# Patient Record
Sex: Male | Born: 1945 | Race: White | Hispanic: No | Marital: Married | Smoking: Former smoker
Health system: Southern US, Community
[De-identification: ages and names within clinical notes are randomized; demographics above are authoritative.]

## PROBLEM LIST (undated history)

## (undated) DIAGNOSIS — L570 Actinic keratosis: Secondary | ICD-10-CM

## (undated) DIAGNOSIS — I519 Heart disease, unspecified: Secondary | ICD-10-CM

## (undated) DIAGNOSIS — E785 Hyperlipidemia, unspecified: Secondary | ICD-10-CM

## (undated) DIAGNOSIS — G4700 Insomnia, unspecified: Secondary | ICD-10-CM

## (undated) DIAGNOSIS — M109 Gout, unspecified: Secondary | ICD-10-CM

## (undated) DIAGNOSIS — Z860100 Personal history of colon polyps, unspecified: Secondary | ICD-10-CM

## (undated) DIAGNOSIS — I2510 Atherosclerotic heart disease of native coronary artery without angina pectoris: Secondary | ICD-10-CM

## (undated) DIAGNOSIS — M1990 Unspecified osteoarthritis, unspecified site: Secondary | ICD-10-CM

## (undated) DIAGNOSIS — N289 Disorder of kidney and ureter, unspecified: Secondary | ICD-10-CM

## (undated) DIAGNOSIS — N529 Male erectile dysfunction, unspecified: Secondary | ICD-10-CM

## (undated) DIAGNOSIS — E119 Type 2 diabetes mellitus without complications: Secondary | ICD-10-CM

## (undated) DIAGNOSIS — I10 Essential (primary) hypertension: Secondary | ICD-10-CM

## (undated) HISTORY — DX: Atherosclerotic heart disease of native coronary artery without angina pectoris: I25.10

## (undated) HISTORY — DX: Hyperlipidemia, unspecified: E78.5

## (undated) HISTORY — DX: Gout, unspecified: M10.9

## (undated) HISTORY — DX: Insomnia, unspecified: G47.00

## (undated) HISTORY — DX: Unspecified osteoarthritis, unspecified site: M19.90

## (undated) HISTORY — DX: Disorder of kidney and ureter, unspecified: N28.9

## (undated) HISTORY — DX: Essential (primary) hypertension: I10

## (undated) HISTORY — DX: Heart disease, unspecified: I51.9

## (undated) HISTORY — DX: Personal history of colonic polyps: Z86.010

## (undated) HISTORY — DX: Actinic keratosis: L57.0

## (undated) HISTORY — DX: Personal history of colon polyps, unspecified: Z86.0100

## (undated) HISTORY — DX: Male erectile dysfunction, unspecified: N52.9

---

## 1980-08-20 DIAGNOSIS — I10 Essential (primary) hypertension: Secondary | ICD-10-CM

## 1980-08-20 HISTORY — DX: Essential (primary) hypertension: I10

## 1986-08-20 HISTORY — PX: KNEE SURGERY: SHX244

## 1995-08-21 DIAGNOSIS — M1990 Unspecified osteoarthritis, unspecified site: Secondary | ICD-10-CM

## 1995-08-21 HISTORY — DX: Unspecified osteoarthritis, unspecified site: M19.90

## 2004-08-20 HISTORY — PX: CARDIAC CATHETERIZATION: SHX172

## 2005-03-20 ENCOUNTER — Encounter: Admission: RE | Admit: 2005-03-20 | Discharge: 2005-03-20 | Payer: Self-pay | Admitting: Family Medicine

## 2005-08-20 HISTORY — PX: COLONOSCOPY: SHX174

## 2006-07-25 ENCOUNTER — Ambulatory Visit: Payer: Self-pay | Admitting: General Surgery

## 2006-08-16 ENCOUNTER — Other Ambulatory Visit: Payer: Self-pay

## 2006-08-23 ENCOUNTER — Ambulatory Visit: Payer: Self-pay | Admitting: General Surgery

## 2006-08-23 HISTORY — PX: HERNIA REPAIR: SHX51

## 2008-01-17 ENCOUNTER — Emergency Department (HOSPITAL_COMMUNITY): Admission: EM | Admit: 2008-01-17 | Discharge: 2008-01-17 | Payer: Self-pay | Admitting: Emergency Medicine

## 2009-11-16 ENCOUNTER — Ambulatory Visit: Payer: Self-pay | Admitting: Rheumatology

## 2009-11-17 ENCOUNTER — Ambulatory Visit: Payer: Self-pay | Admitting: Rheumatology

## 2009-11-18 ENCOUNTER — Ambulatory Visit: Payer: Self-pay | Admitting: Rheumatology

## 2011-01-01 ENCOUNTER — Other Ambulatory Visit: Payer: Self-pay | Admitting: Rheumatology

## 2011-10-19 ENCOUNTER — Ambulatory Visit: Payer: Self-pay | Admitting: General Surgery

## 2013-03-05 ENCOUNTER — Encounter: Payer: Self-pay | Admitting: *Deleted

## 2014-04-27 ENCOUNTER — Ambulatory Visit: Payer: Self-pay | Admitting: Internal Medicine

## 2015-04-26 ENCOUNTER — Other Ambulatory Visit: Payer: Self-pay | Admitting: Internal Medicine

## 2015-04-26 DIAGNOSIS — I77811 Abdominal aortic ectasia: Secondary | ICD-10-CM

## 2015-04-26 DIAGNOSIS — I159 Secondary hypertension, unspecified: Secondary | ICD-10-CM

## 2015-05-05 ENCOUNTER — Ambulatory Visit: Payer: Self-pay

## 2015-05-17 ENCOUNTER — Ambulatory Visit
Admission: RE | Admit: 2015-05-17 | Discharge: 2015-05-17 | Disposition: A | Discharge status: Home / Self Care | Payer: Medicare Other | Source: Ambulatory Visit | Attending: Internal Medicine | Admitting: Internal Medicine

## 2015-05-17 DIAGNOSIS — I159 Secondary hypertension, unspecified: Secondary | ICD-10-CM

## 2015-05-17 DIAGNOSIS — I77811 Abdominal aortic ectasia: Secondary | ICD-10-CM | POA: Insufficient documentation

## 2015-05-17 DIAGNOSIS — I10 Essential (primary) hypertension: Secondary | ICD-10-CM | POA: Diagnosis present

## 2015-10-26 ENCOUNTER — Other Ambulatory Visit: Payer: Self-pay | Admitting: Cardiology

## 2015-10-26 DIAGNOSIS — R3129 Other microscopic hematuria: Secondary | ICD-10-CM

## 2015-11-01 ENCOUNTER — Ambulatory Visit
Admission: RE | Admit: 2015-11-01 | Discharge: 2015-11-01 | Disposition: A | Discharge status: Home / Self Care | Payer: Medicare Other | Source: Ambulatory Visit | Attending: Cardiology | Admitting: Cardiology

## 2015-11-01 DIAGNOSIS — R3129 Other microscopic hematuria: Secondary | ICD-10-CM

## 2016-02-02 ENCOUNTER — Encounter: Payer: Self-pay | Admitting: Internal Medicine

## 2016-02-02 ENCOUNTER — Encounter (INDEPENDENT_AMBULATORY_CARE_PROVIDER_SITE_OTHER): Payer: Medicare Other

## 2016-02-02 ENCOUNTER — Ambulatory Visit (INDEPENDENT_AMBULATORY_CARE_PROVIDER_SITE_OTHER): Payer: Medicare Other | Admitting: Internal Medicine

## 2016-02-02 VITALS — BP 144/90 | HR 75 | Ht 72.0 in | Wt 199.5 lb

## 2016-02-02 DIAGNOSIS — I493 Ventricular premature depolarization: Secondary | ICD-10-CM

## 2016-02-02 DIAGNOSIS — I359 Nonrheumatic aortic valve disorder, unspecified: Secondary | ICD-10-CM

## 2016-02-02 DIAGNOSIS — R9431 Abnormal electrocardiogram [ECG] [EKG]: Secondary | ICD-10-CM | POA: Diagnosis not present

## 2016-02-02 NOTE — Patient Instructions (Signed)
Medication Instructions: - Your physician recommends that you continue on your current medications as directed. Please refer to the Current Medication list given to you today.  Labwork: - none  Procedures/Testing: - Your physician has requested that you have an echocardiogram. Echocardiography is a painless test that uses sound waves to create images of your heart. It provides your doctor with information about the size and shape of your heart and how well your heart's chambers and valves are working. This procedure takes approximately one hour. There are no restrictions for this procedure.  - Your physician has recommended that you wear a 48 hour holter monitor. Holter monitors are medical devices that record the heart's electrical activity. Doctors most often use these monitors to diagnose arrhythmias. Arrhythmias are problems with the speed or rhythm of the heartbeat. The monitor is a small, portable device. You can wear one while you do your normal daily activities. This is usually used to diagnose what is causing palpitations/syncope (passing out).  Follow-Up: - pending results of your tests  Any Additional Special Instructions Will Be Listed Below (If Applicable).     If you need a refill on your cardiac medications before your next appointment, please call your pharmacy.

## 2016-02-02 NOTE — Progress Notes (Signed)
ELECTROPHYSIOLOGY CONSULT NOTE  Patient ID: Paul Choi, MRN: [id number], DOB/AGE: 22-May-1946 70 y.o. Admit date: (Not on file) Date of Consult: 02/02/2016  Primary Physician: Adin Hector, MD Primary Cardiologist: new Consulting Physician none  Chief Complaint: palpiations   HPI Paul Choi is a 70 y.o. male  Seen at his own request because of Irregular heartbeats. He had gone to [facility] urgent care because of chigger bites; they noted an irregular heartbeat. An ECG as described in as sinus rhythm with ectopy He has no history of palpitations and no previous diagnosis of arrhythmia.  He was previously seen by Serafina Royals. In the context of [place] it was his desire, as a former employee of [employer] to have tertiary care work that might be required done at [employer] [facility]. It is for that reason that he comes to [facility] today  He has a history of coronary artery disease described in the old chart from the catheterization 2003 demonstrating an occluded circumflex. He underwent echocardiogram in 2013 which reported  a bicuspid aortic valve  moderate MR and an EF of 40%.  He has some peripheral edema which has been attributed to amlodipine. He has mild dyspnea on exertion but walks a mile many days a week and about 20 minutes. He denies nocturnal dyspnea orthopnea. He's had no palpitations or syncope. He's had no chest pain.  He has sleep disordered breathing and daytime somnolence.  He has modest renal insufficiency with a GFR of 46.  He has diabetes with persistent hemoglobin A1c's of greater than 6.5  Past Medical History  Diagnosis Date  . Arthritis 1997  . Hypertension 1982  . Arteriosclerotic cardiovascular disease   . Personal history of colonic polyps   . Heart disease   . Hyperlipidemia   . Coronary artery disease   . Renal insufficiency   . Gout   . ED (erectile dysfunction)   . Insomnia       [facility]  Past Surgical History  Procedure Laterality  Date  . Hernia repair    . Colonoscopy  2007  . Knee surgery  1988  . Cardiac catheterization  2006     Home Meds: Prior to Admission medications   Medication Sig Start Date End Date Taking? Authorizing Provider  allopurinol (ZYLOPRIM) 100 MG tablet Take 200 mg by mouth daily.    Yes Historical Provider, MD  amLODipine (NORVASC) 5 MG tablet Take 5 mg by mouth daily.   Yes Historical Provider, MD  aspirin 81 MG tablet Take 81 mg by mouth daily.   Yes Historical Provider, MD  cholecalciferol (VITAMIN D) 1000 units tablet Take 1,000 Units by mouth daily.   Yes Historical Provider, MD  colchicine 0.6 MG tablet Take 0.6 mg by mouth daily.   Yes Historical Provider, MD  Fish Oil-Cholecalciferol (FISH OIL + D3 PO) Take by mouth.   Yes Historical Provider, MD  losartan (COZAAR) 100 MG tablet Take 100 mg by mouth daily.   Yes Historical Provider, MD  lovastatin (MEVACOR) 40 MG tablet Take 40 mg by mouth at bedtime.   Yes Historical Provider, MD    Allergies:  Allergies  Allergen Reactions  . Ace Inhibitors Cough  . Lovastatin Other (See Comments)    Myalgia at 80-mg dose  . Other Nausea Only and Hives  . Shellfish Allergy Hives  . Trazodone Other (See Comments)    "felt like zombie"    Social History   Social History  .  Marital Status: Married    Spouse Name: N/A  . Number of Children: N/A  . Years of Education: N/A   Occupational History  . Not on file.   Social History Main Topics  . Smoking status: Former Smoker -- 1.00 packs/day for 25 years    Quit date: 09/22/1995  . Smokeless tobacco: Never Used  . Alcohol Use: Yes  . Drug Use: No  . Sexual Activity: Not on file   Other Topics Concern  . Not on file   Social History Narrative     Family History  Problem Relation Age of Onset  . Hypertension Mother   . Hypertension Brother      ROS:  Please see the history of present illness.     All other systems reviewed and negative.    Physical Exam: Blood pressure  144/90, pulse 75, height 6' (1.829 m), weight 199 lb 8 oz (90.493 kg). General: Well developed, well nourished male in no acute distress. Head: Normocephalic, atraumatic, sclera non-icteric, no xanthomas, nares are without discharge. EENT: normal  Lymph Nodes:  none Neck: Negative for carotid bruits. JVD not elevated. Carotid upstrokes were normal Back:without scoliosis kyphosis Lungs: Clear bilaterally to auscultation without wheezes, rales, or rhonchi. Breathing is unlabored. Heart: Regularly irregular with S1 S2.  2/6 systolic  murmur . No rubs, or gallops appreciated. Abdomen: Soft, non-tender, non-distended with normoactive bowel sounds. No hepatomegaly. No rebound/guarding. No obvious abdominal masses. Msk:  Strength and tone appear normal for age. Extremities: No clubbing or cyanosis. No  edema.  Distal pedal pulses are 2+ and equal bilaterally. Skin: Warm and Dry Neuro: Alert and oriented X 3. CN III-XII intact Grossly normal sensory and motor function . Psych:  Responds to questions appropriately with a normal affect.      Labs: Cardiac Enzymes No results for input(s): CKTOTAL, CKMB, TROPONINI in the last 72 hours. CBC No results found for: WBC, HGB, HCT, MCV, PLT PROTIME: No results for input(s): LABPROT, INR in the last 72 hours. Chemistry No results for input(s): NA, K, CL, CO2, BUN, CREATININE, CALCIUM, PROT, BILITOT, ALKPHOS, ALT, AST, GLUCOSE in the last 168 hours.  Invalid input(s): LABALBU Lipids No results found for: CHOL, HDL, LDLCALC, TRIG BNP No results found for: PROBNP Thyroid Function Tests: No results for input(s): TSH, T4TOTAL, T3FREE, THYROIDAB in the last 72 hours.  Invalid input(s): FREET3 Miscellaneous No results found for: DDIMER  Radiology/Studies:  No results found.  EKG:  Sinus rhythm at 75 Intervals 17/08/38 Axis XXXVI  Assessment and Plan:  Palpitations-PVCs by examination  Coronary artery disease with an occluded circumflex,  left-right collaterals 2003  Cardiomyopathy EF 40% echo 2013 >>>>>>Mitral regurgitation-moderate >>>>>> bicuspid aortic valve  Diabetes  Sleep disordered breathing  Hypertension   The patient has palpitations which are PVCs by examination. He has no known atrial fibrillation. The PVCs raises the question as to whether they could be contributing to what was described as a cardiomyopathy in 2013. We will reassess left ventricular function. We will quantitate his PVC burden. It may be appropriate to try PVC suppression depending on this.  LV function will inform medication options; he is currently on an ARB. In the event that his LV function is suppressed, we will change his amlodipine--carvedilol  We also need to reassess was described as a bicuspid aortic valve. His physical examination is not striking in this regard nor in auscultatory evidence of mitral regurgitation   His blood pressure is reasonably controlled for his diabetes.  He has sleep disordered breathing I think would benefit from an outpatient sleep study  I will have him follow-up with Dr. Caryl Comes regarding treatment for his diabetes. I've encouraged him and weight loss    Virl Axe

## 2016-02-04 DIAGNOSIS — I493 Ventricular premature depolarization: Secondary | ICD-10-CM | POA: Diagnosis not present

## 2016-02-09 ENCOUNTER — Ambulatory Visit
Admission: RE | Admit: 2016-02-09 | Discharge: 2016-02-09 | Disposition: A | Discharge status: Home / Self Care | Payer: Medicare Other | Source: Ambulatory Visit | Attending: Internal Medicine | Admitting: Internal Medicine

## 2016-02-09 DIAGNOSIS — I493 Ventricular premature depolarization: Secondary | ICD-10-CM | POA: Diagnosis not present

## 2016-02-10 ENCOUNTER — Other Ambulatory Visit: Payer: Self-pay

## 2016-02-10 ENCOUNTER — Ambulatory Visit (INDEPENDENT_AMBULATORY_CARE_PROVIDER_SITE_OTHER): Payer: Medicare Other

## 2016-02-10 DIAGNOSIS — I359 Nonrheumatic aortic valve disorder, unspecified: Secondary | ICD-10-CM

## 2016-02-10 LAB — ECHOCARDIOGRAM COMPLETE
AO mean calculated velocity dopler: 113 cm/s
AOASC: 41 cm
AOPV: 0.65 m/s
AOVTI: 33.2 cm
AV Area VTI index: 1.64 cm2/m2
AV Mean grad: 6 mmHg
AV pk vel: 173 cm/s
AV vel: 3.54
AVAREAMEANV: 2.58 cm2
AVAREAMEANVIN: 1.2 cm2/m2
AVAREAVTI: 2.48 cm2
AVCELMEANRAT: 0.68
AVPG: 12 mmHg
CHL CUP AV PEAK INDEX: 1.15
CHL CUP AV VALUE AREA INDEX: 1.64
CHL CUP MV DEC (S): 285
E decel time: 285 msec
E/e' ratio: 7.58
FS: 23 % — AB (ref 28–44)
IV/PV OW: 1.01
LA diam end sys: 28 mm
LA vol: 37.9 mL
LADIAMINDEX: 1.3 cm/m2
LASIZE: 28 mm
LAVOLA4C: 26 mL
LAVOLIN: 17.6 mL/m2
LDCA: 3.8 cm2
LV E/e' medial: 7.58
LV E/e'average: 7.58
LV PW d: 7.77 mm — AB (ref 0.6–1.1)
LV TDI E'MEDIAL: 5.11
LVELAT: 8.38 cm/s
LVOT SV: 117 mL
LVOT VTI: 30.9 cm
LVOT peak grad rest: 5 mmHg
LVOTD: 22 mm
LVOTPV: 113 cm/s
LVOTVTI: 0.93 cm
MVPKAVEL: 113 m/s
MVPKEVEL: 63.5 m/s
TAPSE: 22.8 mm
TDI e' lateral: 8.38
Valve area: 3.54 cm2

## 2016-02-17 ENCOUNTER — Telehealth: Payer: Self-pay | Admitting: Internal Medicine

## 2016-02-17 ENCOUNTER — Ambulatory Visit: Payer: Medicare Other | Admitting: Internal Medicine

## 2016-02-17 DIAGNOSIS — Q231 Congenital insufficiency of aortic valve: Secondary | ICD-10-CM

## 2016-02-17 DIAGNOSIS — I77810 Thoracic aortic ectasia: Secondary | ICD-10-CM

## 2016-02-17 DIAGNOSIS — R931 Abnormal findings on diagnostic imaging of heart and coronary circulation: Secondary | ICD-10-CM

## 2016-02-17 NOTE — Telephone Encounter (Signed)
Pt calling stating he had echo and monitor done a few weeks ago Saw in [place] that results were in there But he does not understand them Would like a call back on that For we stated to him we would call him back and schedule a follow up based on those results. Please call back.

## 2016-02-17 NOTE — Telephone Encounter (Signed)
I called and spoke with the patient.  He is aware of his echo/ holter results.  Echo: Notes Recorded by Deboraha Sprang, MD on 02/13/2016 at 4:55 PM Please Inform Patient Echo showed Normal heart muscle function; aortic valve may be biscupid- functioanlly if not structurally, no evidence of stenosi. The aortic root is enlarged, albeit mildy which may be a consequence of the HBP or the valve. Recommendation would be for cardiac CT --angiogram for aortic size and to attempt to visualize the valve  Holter: Notes Recorded by Deboraha Sprang, MD on 02/13/2016 at 3:04 PM PVC burden not trivail but not clearly contributing 6.3% Will await echo        He is agreeable with proceeding with a cardiac CTA, but would like this done are [facility]. I advised the patient that I do not think [facility] has the capability to do this particular scan here. I advised him I will call to clarify- I have left a message for the radiology dept to please call me back to confirm. Will also confirm follow up with Dr. Caryl Comes.   Call back from [facility] radiology- Cardiac CTA is not done here- will need to go to [facility].

## 2016-02-22 ENCOUNTER — Encounter: Payer: Self-pay | Admitting: Internal Medicine

## 2016-02-22 NOTE — Telephone Encounter (Signed)
I called and spoke with the patient. I advised him Cardiac CT is not able to be done at [facility]. He is agreeable with coming to [facility] to have this done. I advised him I will place the order and scheduling will call him to arrange this.

## 2016-02-27 ENCOUNTER — Telehealth: Payer: Self-pay | Admitting: Internal Medicine

## 2016-02-27 NOTE — Telephone Encounter (Signed)
New message      Pt is having a CT on 02-29-16.  They need the order signed in epic please

## 2016-02-27 NOTE — Telephone Encounter (Signed)
Routing message to Dr. Caryl Comes to sign order

## 2016-02-27 NOTE — Telephone Encounter (Signed)
Done

## 2016-02-29 ENCOUNTER — Encounter (HOSPITAL_COMMUNITY): Payer: Self-pay

## 2016-02-29 ENCOUNTER — Ambulatory Visit (HOSPITAL_COMMUNITY)
Admission: RE | Admit: 2016-02-29 | Discharge: 2016-02-29 | Disposition: A | Discharge status: Home / Self Care | Payer: Medicare Other | Source: Ambulatory Visit | Attending: Internal Medicine | Admitting: Internal Medicine

## 2016-02-29 DIAGNOSIS — J439 Emphysema, unspecified: Secondary | ICD-10-CM | POA: Diagnosis not present

## 2016-02-29 DIAGNOSIS — R918 Other nonspecific abnormal finding of lung field: Secondary | ICD-10-CM | POA: Diagnosis not present

## 2016-02-29 DIAGNOSIS — Q231 Congenital insufficiency of aortic valve: Secondary | ICD-10-CM

## 2016-02-29 LAB — POCT I-STAT CREATININE: CREATININE: 1.5 mg/dL — AB (ref 0.61–1.24)

## 2016-02-29 MED ORDER — METOPROLOL TARTRATE 5 MG/5ML IV SOLN
INTRAVENOUS | Status: AC
  Administered 2016-02-29: 5 mg via INTRAVENOUS
  Filled 2016-02-29: qty 15

## 2016-02-29 MED ORDER — NITROGLYCERIN 0.4 MG SL SUBL
0.8000 mg | SUBLINGUAL_TABLET | SUBLINGUAL | Status: DC | PRN
  Administered 2016-02-29: 0.8 mg via SUBLINGUAL

## 2016-02-29 MED ORDER — NITROGLYCERIN 0.4 MG SL SUBL
SUBLINGUAL_TABLET | SUBLINGUAL | Status: AC
  Administered 2016-02-29: 0.8 mg via SUBLINGUAL
  Filled 2016-02-29: qty 2

## 2016-02-29 MED ORDER — DIPHENHYDRAMINE HCL 50 MG/ML IJ SOLN
INTRAMUSCULAR | Status: AC
  Administered 2016-02-29: 50 mg
  Filled 2016-02-29: qty 1

## 2016-02-29 MED ORDER — METOPROLOL TARTRATE 5 MG/5ML IV SOLN
5.0000 mg | INTRAVENOUS | Status: DC | PRN
  Administered 2016-02-29: 5 mg via INTRAVENOUS

## 2016-02-29 MED ORDER — IOPAMIDOL (ISOVUE-370) INJECTION 76%
INTRAVENOUS | Status: AC
  Administered 2016-02-29: 80 mL
  Filled 2016-02-29: qty 100

## 2016-02-29 NOTE — Progress Notes (Signed)
Pt assisted off CT table with coughing, continued to cough, and audible wheeze Dr. Carlis Abbott at [facility], [place] started and oxygen on

## 2016-02-29 NOTE — Progress Notes (Signed)
Dr Meda Coffee updated per phone, will continue to monitor, Dr Carlis Abbott remains at bedside

## 2016-02-29 NOTE — Progress Notes (Signed)
Pt sitting up Dr Carlis Abbott at bs states he feels better, VS stable on room air

## 2016-02-29 NOTE — Progress Notes (Signed)
Pt discharged home amb to waiting room with spouse, denies c/o

## 2016-03-08 ENCOUNTER — Encounter: Payer: Self-pay | Admitting: Internal Medicine

## 2016-03-08 NOTE — Progress Notes (Signed)
Spoke with the patient about his CT. 1-contrast allergic reaction #2-ends, will need MRI scanning a year from now for reassessment of his aorta 3-Will review with Dr. Burt Knack the potential value of functional assessment of the asymptomatic presumably high-grade lesion 4-aortic valve is bicuspid but without evidence of stenosis this will require long-term follow-up 5-small pulmonary nodules noted. These can be followed by noncontrast CT. 6-we'll forward these results to Dr. Ramonita Lab and ask that he follow-up on the noncontrast CT

## 2016-03-13 ENCOUNTER — Other Ambulatory Visit: Payer: Self-pay | Admitting: Internal Medicine

## 2016-03-13 NOTE — Telephone Encounter (Signed)
Paul Choi is calling because Dr. Caryl Comes was to have a new prescription for a Generic for Lipitor and when checking at his pharmacy ([place] [address] in [place] ) . Please call    Thanks

## 2016-03-15 NOTE — Telephone Encounter (Signed)
Called patient to follow up on his request and inform him that Dr. Caryl Comes and his nurse are both out this week.   Patient states that he and Dr. Caryl Comes spoke recently about switching him to generic Lipitor but when he checked with his pharmacy nothing had been sent in.  He was simply calling to follow up. States he figured Dr. Caryl Comes was out this week and there is no rush.  He is aware someone will contact him next week once discussed with Dr. Caryl Comes.

## 2016-03-19 MED ORDER — ATORVASTATIN CALCIUM 40 MG PO TABS: 40.0000 mg | ORAL_TABLET | Freq: Every day | ORAL | 6 refills | Status: DC

## 2016-03-19 NOTE — Telephone Encounter (Signed)
Per Dr. Caryl Comes- start atorvastatin 40 mg once daily. RX sent to the patient's pharmacy.

## 2016-03-29 ENCOUNTER — Encounter: Payer: Self-pay | Admitting: Internal Medicine

## 2016-04-03 ENCOUNTER — Telehealth: Payer: Self-pay | Admitting: Internal Medicine

## 2016-04-03 DIAGNOSIS — R9431 Abnormal electrocardiogram [ECG] [EKG]: Secondary | ICD-10-CM

## 2016-04-03 DIAGNOSIS — R931 Abnormal findings on diagnostic imaging of heart and coronary circulation: Secondary | ICD-10-CM

## 2016-04-03 NOTE — Telephone Encounter (Signed)
I spoke with Dr. Burt Knack. While he agreed that the indication for intervention would be symptoms, he also felt that if there were large portion of myocardium at risk he would be inclined towards a discussion regarding intervention. I will review this with the pt

## 2016-04-03 NOTE — Telephone Encounter (Signed)
Spoke with the patient--as outlined in my conversation with Dr. Burt Knack, there is minimal once is a we will undertake Myoview scanning to look functionally at the potential myocardium at risk

## 2016-04-03 NOTE — Telephone Encounter (Signed)
Dr. Caryl Comes- please clarify.

## 2016-04-05 ENCOUNTER — Encounter: Payer: Self-pay | Admitting: *Deleted

## 2016-04-05 ENCOUNTER — Other Ambulatory Visit: Payer: Self-pay | Admitting: *Deleted

## 2016-04-05 MED ORDER — ATORVASTATIN CALCIUM 40 MG PO TABS: 40.0000 mg | ORAL_TABLET | Freq: Every day | ORAL | 3 refills | Status: DC

## 2016-04-05 NOTE — Telephone Encounter (Signed)
[place]  Your caregiver has ordered a Stress Test with nuclear imaging. The purpose of this test is to evaluate the blood supply to your heart muscle. This procedure is referred to as a "Non-Invasive Stress Test." This is because other than having an IV started in your vein, nothing is inserted or "invades" your body. Cardiac stress tests are done to find areas of poor blood flow to the heart by determining the extent of coronary artery disease (CAD). Some patients exercise on a treadmill, which naturally increases the blood flow to your heart, while others who are  unable to walk on a treadmill due to physical limitations have a pharmacologic/chemical stress agent called Lexiscan . This medicine will mimic walking on a treadmill by temporarily increasing your coronary blood flow.   Please note: these test may take anywhere between 2-4 hours to complete  PLEASE REPORT TO [facility] MEDICAL MALL ENTRANCE  THE VOLUNTEERS AT THE FIRST DESK WILL DIRECT YOU WHERE TO GO  Date of Procedure:_______Wednesday 8/23/17______________________________  Arrival Time for Procedure:________8:15 am______________________  Instructions regarding medication:   ____ : Hold diabetes medication morning of procedure  ____:  Hold betablocker(s) night before procedure and morning of procedure  ____:  Hold other medications as follows:_________________________________________________________________________________________________________________________________________________________________________________________________________________________________________________________________________________________  PLEASE NOTIFY THE OFFICE AT LEAST 24 HOURS IN ADVANCE IF YOU ARE UNABLE TO KEEP YOUR APPOINTMENT.  [phone] AND  PLEASE NOTIFY NUCLEAR MEDICINE AT [facility] AT LEAST 24 HOURS IN ADVANCE IF YOU ARE UNABLE TO KEEP YOUR APPOINTMENT. [phone]  How to prepare for your Myoview test:  1. Do not eat or drink after  midnight 2. No caffeine for 24 hours prior to test 3. No smoking 24 hours prior to test. 4. Your medication may be taken with water.  If your doctor stopped a medication because of this test, do not take that medication. 5. Ladies, please do not wear dresses.  Skirts or pants are appropriate. Please wear a short sleeve shirt. 6. No perfume, cologne or lotion. 7. Wear comfortable walking shoes. No heels!           The patient is aware of the date/ time for his nuclear stress test. He is is aware of his instructions. I have advised him to call back with any further questions/ concerns/ if he needs to reschedule. He voices understanding.

## 2016-04-05 NOTE — Telephone Encounter (Signed)
Stress myoview for evidence of significant ischemia in the context of MRI identified lesion

## 2016-04-11 ENCOUNTER — Encounter
Admission: RE | Admit: 2016-04-11 | Discharge: 2016-04-11 | Disposition: A | Discharge status: Home / Self Care | Payer: Medicare Other | Source: Ambulatory Visit | Attending: Internal Medicine | Admitting: Internal Medicine

## 2016-04-11 DIAGNOSIS — R931 Abnormal findings on diagnostic imaging of heart and coronary circulation: Secondary | ICD-10-CM | POA: Diagnosis not present

## 2016-04-11 DIAGNOSIS — I259 Chronic ischemic heart disease, unspecified: Secondary | ICD-10-CM | POA: Insufficient documentation

## 2016-04-11 DIAGNOSIS — R9431 Abnormal electrocardiogram [ECG] [EKG]: Secondary | ICD-10-CM | POA: Diagnosis not present

## 2016-04-11 LAB — NM MYOCAR MULTI W/SPECT W/WALL MOTION / EF
CHL CUP MPHR: 130 {beats}/min
CHL CUP NUCLEAR SDS: 1
CHL CUP NUCLEAR SRS: 11
CHL CUP NUCLEAR SSS: 11
CSEPED: 4 min
CSEPPHR: 130 {beats}/min
Estimated workload: 6.5 METS
Exercise duration (sec): 42 s
LVDIAVOL: 104 mL (ref 62–150)
LVSYSVOL: 37 mL
Percent HR: 87 %
Rest HR: 65 {beats}/min
TID: 1

## 2016-04-11 MED ORDER — TECHNETIUM TC 99M TETROFOSMIN IV KIT
28.0300 | PACK | Freq: Once | INTRAVENOUS | Status: AC | PRN
  Administered 2016-04-11: 28.03 via INTRAVENOUS

## 2016-04-11 MED ORDER — TECHNETIUM TC 99M TETROFOSMIN IV KIT
12.8500 | PACK | Freq: Once | INTRAVENOUS | Status: AC | PRN
  Administered 2016-04-11: 12.85 via INTRAVENOUS

## 2016-04-17 ENCOUNTER — Telehealth: Payer: Self-pay | Admitting: Internal Medicine

## 2016-04-17 NOTE — Telephone Encounter (Signed)
I spoke with the patient and he is aware of his results. Will schedule on 04/27/16 at 11:30 am with Dr. Burt Knack per Ander Purpura.

## 2016-04-17 NOTE — Telephone Encounter (Signed)
New message ° ° ° ° ° °Returning a call to the nurse to get stress test results °

## 2016-04-24 ENCOUNTER — Encounter: Payer: Self-pay | Admitting: Cardiovascular Disease

## 2016-04-26 ENCOUNTER — Ambulatory Visit (INDEPENDENT_AMBULATORY_CARE_PROVIDER_SITE_OTHER): Payer: Medicare Other | Admitting: Cardiovascular Disease

## 2016-04-26 ENCOUNTER — Encounter: Payer: Self-pay | Admitting: Cardiovascular Disease

## 2016-04-26 VITALS — BP 148/82 | HR 71 | Ht 73.0 in | Wt 198.8 lb

## 2016-04-26 DIAGNOSIS — R931 Abnormal findings on diagnostic imaging of heart and coronary circulation: Secondary | ICD-10-CM | POA: Diagnosis not present

## 2016-04-26 DIAGNOSIS — Z01810 Encounter for preprocedural cardiovascular examination: Secondary | ICD-10-CM | POA: Diagnosis not present

## 2016-04-26 LAB — CBC WITH DIFFERENTIAL/PLATELET
BASOS ABS: 0 {cells}/uL (ref 0–200)
Basophils Relative: 0 %
EOS ABS: 264 {cells}/uL (ref 15–500)
Eosinophils Relative: 4 %
HCT: 40.7 % (ref 38.5–50.0)
HEMOGLOBIN: 13.8 g/dL (ref 13.2–17.1)
LYMPHS ABS: 1848 {cells}/uL (ref 850–3900)
LYMPHS PCT: 28 %
MCH: 30.4 pg (ref 27.0–33.0)
MCHC: 33.9 g/dL (ref 32.0–36.0)
MCV: 89.6 fL (ref 80.0–100.0)
MONO ABS: 924 {cells}/uL (ref 200–950)
MPV: 10.8 fL (ref 7.5–12.5)
Monocytes Relative: 14 %
NEUTROS PCT: 54 %
Neutro Abs: 3564 cells/uL (ref 1500–7800)
Platelets: 254 10*3/uL (ref 140–400)
RBC: 4.54 MIL/uL (ref 4.20–5.80)
RDW: 15.3 % — AB (ref 11.0–15.0)
WBC: 6.6 10*3/uL (ref 3.8–10.8)

## 2016-04-26 LAB — BASIC METABOLIC PANEL
BUN: 28 mg/dL — ABNORMAL HIGH (ref 7–25)
CALCIUM: 9.5 mg/dL (ref 8.6–10.3)
CO2: 24 mmol/L (ref 20–31)
Chloride: 101 mmol/L (ref 98–110)
Creat: 1.49 mg/dL — ABNORMAL HIGH (ref 0.70–1.18)
Glucose, Bld: 93 mg/dL (ref 65–99)
Potassium: 4.7 mmol/L (ref 3.5–5.3)
SODIUM: 134 mmol/L — AB (ref 135–146)

## 2016-04-26 MED ORDER — FAMOTIDINE 20 MG PO TABS: ORAL_TABLET | ORAL | 0 refills | Status: DC

## 2016-04-26 MED ORDER — CLOPIDOGREL BISULFATE 75 MG PO TABS: 75.0000 mg | ORAL_TABLET | Freq: Every day | ORAL | 3 refills | Status: DC

## 2016-04-26 MED ORDER — PREDNISONE 20 MG PO TABS: ORAL_TABLET | ORAL | 0 refills | Status: DC

## 2016-04-26 NOTE — Patient Instructions (Signed)
**Note De-Identified Paul Choi Obfuscation** Medication Instructions:  Same-No changes. Please see Cath instruction sheet for medication to take prior to procedure  Labwork: Today_BMET/CBC and INR  Testing/Procedures: Your physician has requested that you have a cardiac catheterization. Cardiac catheterization is used to diagnose and/or treat various heart conditions. Doctors may recommend this procedure for a number of different reasons. The most common reason is to evaluate chest pain. Chest pain can be a symptom of coronary artery disease (CAD), and cardiac catheterization can show whether plaque is narrowing or blocking your heart's arteries. This procedure is also used to evaluate the valves, as well as measure the blood flow and oxygen levels in different parts of your heart. For further information please visit [email or web address]. Please follow instruction sheet, as given.   Follow-Up: After Cath     If you need a refill on your cardiac medications before your next appointment, please call your pharmacy.

## 2016-04-26 NOTE — Progress Notes (Signed)
Cardiology Office Note Date:  04/27/2016   ID:  Paul Choi, DOB December 09, 1945, MRN [id number]  PCP:  Adin Hector, MD  Cardiologist:  Dr Jolyn Nap Referring: Dr Jolyn Nap  Chief Complaint  Patient presents with  . Shortness of Breath     History of Present Illness: Paul Choi is a 70 y.o. male who presents for evaluation of CAD and an abnormal stress test.   The patient has been followed over time for hypertension and hyperlipidemia. He's been on long-term aspirin. He underwent a heart catheterization in 2003 after an abnormal stress test. This demonstrated total occlusion of the left circumflex and otherwise mild nonobstructive coronary artery disease. Medical therapy was recommended. He has had no clinical history of a myocardial infarction. He's been seen by Dr. Caryl Comes and has undergone a cardiac testing with an echocardiogram, nuclear stress test, and gated cardiac CTA scan.  The echocardiogram was done in June 2017 and showed normal LV function, mildly dilated aortic root, and raise the possibility of a functionally bicuspid aortic valve. 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 The patient's coronary calcium score of 750 placed him in the 75th percentile for age and sex matched control. He has a right dominant coronary anatomy with severe 70-99% stenosis of the proximal right coronary artery. The left main and LAD are patent with mild to moderate calcified plaque in the mid LAD and maximum stenosis estimated less than 50%. The left circumflex is small in caliber and gives rise to an obtuse marginal branch that is occluded proximally. The patient's nuclear scan demonstrated an LVEF of 42% and was considered moderate to high risk because of a predominantly fixed defect in the basal to mid lateral and inferolateral walls and a small to moderate reversible defect in  the mid to apical anterior and anteroseptal walls.  From a symptomatic standpoint, the patient is doing well. He has not engaged in aerobic exercise, but he is able to walk about two thirds of a mile before resting. States that he does need to stop and rest after that amount of time. May have shortness of breath associated with this but denies any severe symptoms. He specifically denies any symptoms of chest pain, chest pressure, or lightheadedness. He denies edema or calf claudication symptoms.   Past Medical History:  Diagnosis Date  . Arteriosclerotic cardiovascular disease   . Arthritis 1997  . Coronary artery disease    Cardiac catheterization in August 2003 with occluded left circumflex. Echo 8/13 with mild segmental LV dysfunction, EF 40%, moderate mitral insufficiency and mild tricuspid insufficiency; aortic valve appears bicuspid  . ED (erectile dysfunction)   . Gout   . Heart disease   . Hyperlipidemia   . Hypertension 1982  . Insomnia   . Personal history of colonic polyps   . Renal insufficiency     Past Surgical History:  Procedure Laterality Date  . CARDIAC CATHETERIZATION  2006  . COLONOSCOPY  2007  . HERNIA REPAIR    . KNEE SURGERY  1988    Current Outpatient Prescriptions  Medication Sig Dispense Refill  . allopurinol (ZYLOPRIM) 100 MG tablet Take 200 mg by mouth daily.     Marland Kitchen amLODipine (NORVASC) 5 MG tablet Take 5 mg by mouth daily.    Marland Kitchen aspirin 81 MG tablet Take 81 mg by mouth daily.    Marland Kitchen atorvastatin (  LIPITOR) 40 MG tablet Take 1 tablet (40 mg total) by mouth daily. 90 tablet 3  . cholecalciferol (VITAMIN D) 1000 units tablet Take 1,000 Units by mouth daily.    . colchicine 0.6 MG tablet Take 0.6 mg by mouth daily.    . Fish Oil-Cholecalciferol (FISH OIL + D3 PO) Take by mouth.    . losartan (COZAAR) 100 MG tablet Take 100 mg by mouth daily.    . clopidogrel (PLAVIX) 75 MG tablet Take 1 tablet (75 mg total) by mouth daily. 30 tablet 3  . famotidine (PEPCID)  20 MG tablet Take 1 tablet the night before and the morning of procedure 2 tablet 0  . predniSONE (DELTASONE) 20 MG tablet Take 2 tablets (40 mg) on the night before and the morning of procedure 4 tablet 0   No current facility-administered medications for this visit.     Allergies:   Ace inhibitors; Contrast media [iodinated diagnostic agents]; Lovastatin; Other; Shellfish allergy; and Trazodone   Social History:  The patient  reports that he quit smoking about 20 years ago. He has a 25.00 pack-year smoking history. He has never used smokeless tobacco. He reports that he drinks alcohol. He reports that he does not use drugs.   Family History:  The patient's family history includes Hypertension in his brother and mother. Father lived to age 37, mother lived to 3. No premature CAD in siblings or parents. Oldest son had cardiac surgery at 56 yo for bicuspid aortic valve stenosis.   ROS:  Please see the history of present illness.  Otherwise, review of systems is positive for easy bruising.  All other systems are reviewed and negative.    PHYSICAL EXAM: VS:  BP (!) 148/82   Pulse 71   Ht 6\' 1"  (1.854 m)   Wt 90.2 kg (198 lb 12.8 oz)   SpO2 98%   BMI 26.23 kg/m  , BMI Body mass index is 26.23 kg/m. GEN: Well nourished, well developed, in no acute distress  HEENT: normal  Neck: no JVD, no masses. No carotid bruits Cardiac: RRR with 2/6 systolic murmur at the RUSB        Respiratory:  clear to auscultation bilaterally, normal work of breathing GI: soft, nontender, nondistended, + BS MS: no deformity or atrophy  Ext: no pretibial edema, pedal pulses 2+= bilaterally Skin: warm and dry, no rash Neuro:  Strength and sensation are intact Psych: euthymic mood, full affect  EKG:  EKG is not ordered today.  Recent Labs: 04/26/2016: BUN 28; Creat 1.49; Hemoglobin 13.8; Platelets 254; Potassium 4.7; Sodium 134   Lipid Panel  No results found for: CHOL, TRIG, HDL, CHOLHDL, VLDL, LDLCALC,  LDLDIRECT    Wt Readings from Last 3 Encounters:  04/26/16 90.2 kg (198 lb 12.8 oz)  02/02/16 90.5 kg (199 lb 8 oz)     Cardiac Studies Reviewed: 2D Echo 02-10-2016: Study Conclusions  - Left ventricle: The cavity size was normal. Systolic function was   normal. The estimated ejection fraction was in the range of 55%   to 60%. Wall motion was normal; there were no regional wall   motion abnormalities. Doppler parameters are consistent with   abnormal left ventricular relaxation (grade 1 diastolic   dysfunction). - Aortic valve: There was trivial regurgitation. - Aorta: Ascending aorta is mildly dilated, diameter: 41 mm (S). - Aortic root: The aortic root was mildly dilated, 4 cm. Aortic   arch is normal in size. - Left atrium: The atrium was  normal in size. - Right ventricle: Systolic function was normal. - Pulmonary arteries: Systolic pressure was within the normal   range.  Myoview: Study Result   Exercise myocardial perfusion imaging study with moderate sized region of predominantly fixed defect in the basal to mid lateral and inferolateral wall of moderate intensity on nonattenuation corrected and attenuation corrected images. Small region of peri-infarct ischemia noted.   There is also a small to moderate sized region of mild ischemia noted in the mid to apical anterior and anteroseptal wall on attenuation corrected images  Lateral wall hypokinesis noted,  EF estimated at 42% No EKG changes concerning for ischemia at peak stress or in recovery, PVCs noted Moderate to high risk scan    CT Coronary: FINDINGS: Aortic Valve: Anatomically tricuspid, but functionally bicuspid aortic valve with co-joined left and right aortic leaflet. The leaflets are mildly thickened and calcified with minimally restricted leaflet opening. Aorta: Aneurysmal ascending aorta with mild diffuse calcifications. Moderate diffuse calcifications in the aortic arch predominantly around the  origin of the brachiocephalic arteries. Sinotubular Junction:  40 x 37 mm Ascending Thoracic Aorta:  43 x 42 mm Aortic Arch:  28 x 24 mm Descending Thoracic Aorta:  28 x 28 mm Sinus of Valsalva Measurements: Non-coronary:  38 mm Right -coronary:  38 mm Left -coronary:  40 mm Coronary Arteries: 1. Coronary calcium score of 750. This was 17 percentile for age and sex matched control. 2.  Normal coronary origin, right dominance. RCA is a large caliber artery that gives rise to PDA. There are moderate diffuse calcifications in the proximal to mid RCA with mild to moderate stenoses, however there is a focal severe stenosis in the proximal RCA with associated stenosis > 70%. LM is a long artery with mild calcified plaque in the distal portion and minimal stenosis. LAD is a large vessel that gives rise to two large diagonal branches. There is mild to moderate calcified plaque in the proximal to mid LAD with maximum stenosis 50%. There is a moderate plaque in the 2.diagonal branch with associated stenosis 50-69%. LCX artery is small caliber, gives rise to one small OM branch and is occluded in its proximal portion. Other findings: Dilated pulmonary artery measuring 32 x 30 mm suggestive of pulmonary hypertension. Normal pulmonary vein drainage into the left atrium. A small left atrial appendage with no evidence of a thrombus. IMPRESSION: 1. Anatomically tricuspid, but functionally bicuspid aortic valve with co-joined left and right aortic leaflet and minimally restricted leaflet opening with no evidence of stenosis. 2. Mildly dilated aortic root with maximum diameter 40 mm. 3. 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 An annual follow up with CTA or MRA is recommended. No prior study is available for comparison. 4. Coronary calcium score of 750. This was 23 percentile for age and sex matched control. 5. Diffuse mild to moderate CAD, with a severe mixed  plaque in the proximal RCA associated with 70-99% stenosis. A stress test is Recommended.  ASSESSMENT AND PLAN: 1.  CAD, native vessel, with a moderate/high-risk nuclear scan and no clear angina. The patient is on appropriate risk-reduction medical program with ASA and a statin drug. We reviewed all of his imaging studies to date. We discussed available data regarding patient's without clear anginal symptoms. The patient has a remote history of cardiac catheterization demonstrating total occlusion of an OM branch and the patient apparently was asymptomatic at that time also. I think he is at risk of complications  from silent ischemia considering his moderate to high risk findings from noninvasive testing. We specifically reviewed the pros and cons of medical therapy versus cardiac catheterization and possible PCI. He favors definitive evaluation and I agree this is reasonable. I have reviewed the risks, indications, and alternatives to cardiac catheterization, possible angioplasty, and stenting with the patient. Risks include but are not limited to bleeding, infection, vascular injury, stroke, myocardial infection, arrhythmia, kidney injury, radiation-related injury in the case of prolonged fluoroscopy use, emergency cardiac surgery, and death. The patient understands the risks of serious complication is 1-2 in [id number] with diagnostic cardiac cath and 1-2% or less with angioplasty/stenting.   The patient had a contrast reaction with his CAT scan and he will be premedicated with prednisone and Benadryl for cardiac catheterization.  2. 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 Annual imaging surveillance is recommended. The patient probably should be started on a low-dose beta blocker. Will review at the time of his catheterization.  3. Hyperlipidemia: Patient is on a statin drug, followed by his primary physician.  Current medicines are reviewed with the patient  today.  The patient does not have concerns regarding medicines.  Labs/ tests ordered today include:   Orders Placed This Encounter  Procedures  . Basic Metabolic Panel (BMET)  . CBC with Differential  . INR/PT    Disposition:   Pending cardiac catheterization results  Signed, Sherren Mocha, MD  04/27/2016 6:13 AM    [facility] [address], [place], [place]  [address] Phone: [phone]; Fax: [phone]

## 2016-04-27 ENCOUNTER — Ambulatory Visit: Payer: Medicare Other | Admitting: Cardiovascular Disease

## 2016-04-27 LAB — PROTIME-INR
INR: 1
Prothrombin Time: 10.5 s (ref 9.0–11.5)

## 2016-05-01 ENCOUNTER — Other Ambulatory Visit: Payer: Self-pay | Admitting: Cardiovascular Disease

## 2016-05-04 ENCOUNTER — Encounter (HOSPITAL_COMMUNITY): Payer: Self-pay | Admitting: Cardiovascular Disease

## 2016-05-04 ENCOUNTER — Encounter (HOSPITAL_COMMUNITY)
Admission: RE | Disposition: A | Discharge status: Home / Self Care | Payer: Self-pay | Source: Ambulatory Visit | Attending: Cardiovascular Disease

## 2016-05-04 ENCOUNTER — Ambulatory Visit (HOSPITAL_COMMUNITY)
Admission: RE | Admit: 2016-05-04 | Discharge: 2016-05-05 | Disposition: A | Discharge status: Home / Self Care | Payer: Medicare Other | Source: Ambulatory Visit | Attending: Cardiovascular Disease | Admitting: Cardiovascular Disease

## 2016-05-04 DIAGNOSIS — Z23 Encounter for immunization: Secondary | ICD-10-CM | POA: Diagnosis not present

## 2016-05-04 DIAGNOSIS — E785 Hyperlipidemia, unspecified: Secondary | ICD-10-CM | POA: Diagnosis not present

## 2016-05-04 DIAGNOSIS — M1990 Unspecified osteoarthritis, unspecified site: Secondary | ICD-10-CM | POA: Diagnosis not present

## 2016-05-04 DIAGNOSIS — I129 Hypertensive chronic kidney disease with stage 1 through stage 4 chronic kidney disease, or unspecified chronic kidney disease: Secondary | ICD-10-CM | POA: Insufficient documentation

## 2016-05-04 DIAGNOSIS — Z955 Presence of coronary angioplasty implant and graft: Secondary | ICD-10-CM | POA: Diagnosis not present

## 2016-05-04 DIAGNOSIS — Z87891 Personal history of nicotine dependence: Secondary | ICD-10-CM | POA: Diagnosis not present

## 2016-05-04 DIAGNOSIS — N183 Chronic kidney disease, stage 3 unspecified: Secondary | ICD-10-CM

## 2016-05-04 DIAGNOSIS — I10 Essential (primary) hypertension: Secondary | ICD-10-CM

## 2016-05-04 DIAGNOSIS — I77819 Aortic ectasia, unspecified site: Secondary | ICD-10-CM | POA: Diagnosis not present

## 2016-05-04 DIAGNOSIS — Z91041 Radiographic dye allergy status: Secondary | ICD-10-CM | POA: Insufficient documentation

## 2016-05-04 DIAGNOSIS — I2510 Atherosclerotic heart disease of native coronary artery without angina pectoris: Secondary | ICD-10-CM | POA: Diagnosis present

## 2016-05-04 DIAGNOSIS — Z7982 Long term (current) use of aspirin: Secondary | ICD-10-CM | POA: Diagnosis not present

## 2016-05-04 DIAGNOSIS — R931 Abnormal findings on diagnostic imaging of heart and coronary circulation: Secondary | ICD-10-CM

## 2016-05-04 HISTORY — DX: Type 2 diabetes mellitus without complications: E11.9

## 2016-05-04 HISTORY — PX: CORONARY STENT PLACEMENT: SHX1402

## 2016-05-04 HISTORY — PX: CARDIAC CATHETERIZATION: SHX172

## 2016-05-04 LAB — GLUCOSE, CAPILLARY
Glucose-Capillary: 203 mg/dL — ABNORMAL HIGH (ref 65–99)
Glucose-Capillary: 176 mg/dL — ABNORMAL HIGH (ref 65–99)
Glucose-Capillary: 178 mg/dL — ABNORMAL HIGH (ref 65–99)

## 2016-05-04 LAB — POCT ACTIVATED CLOTTING TIME: Activated Clotting Time: 263 seconds

## 2016-05-04 SURGERY — LEFT HEART CATH AND CORONARY ANGIOGRAPHY
Anesthesia: LOCAL

## 2016-05-04 MED ORDER — DOCUSATE SODIUM 100 MG PO CAPS
200.0000 mg | ORAL_CAPSULE | Freq: Every day | ORAL | Status: DC | PRN
Start: 2016-05-04 — End: 2016-05-05

## 2016-05-04 MED ORDER — ASPIRIN EC 81 MG PO TBEC
81.0000 mg | DELAYED_RELEASE_TABLET | Freq: Every day | ORAL | Status: DC
Start: 2016-05-05 — End: 2016-05-05
  Administered 2016-05-05: 81 mg via ORAL
  Filled 2016-05-04: qty 1

## 2016-05-04 MED ORDER — SODIUM CHLORIDE 0.9% FLUSH: 3.0000 mL | INTRAVENOUS | Status: DC | PRN

## 2016-05-04 MED ORDER — DIPHENHYDRAMINE HCL 50 MG/ML IJ SOLN
INTRAMUSCULAR | Status: DC | PRN
  Administered 2016-05-04: 50 mg via INTRAVENOUS

## 2016-05-04 MED ORDER — ATORVASTATIN CALCIUM 40 MG PO TABS
40.0000 mg | ORAL_TABLET | Freq: Every evening | ORAL | Status: DC
  Administered 2016-05-04: 40 mg via ORAL
  Filled 2016-05-04: qty 1

## 2016-05-04 MED ORDER — SODIUM CHLORIDE 0.9% FLUSH
3.0000 mL | Freq: Two times a day (BID) | INTRAVENOUS | Status: DC
  Administered 2016-05-04 – 2016-05-05 (×3): 3 mL via INTRAVENOUS

## 2016-05-04 MED ORDER — SODIUM CHLORIDE 0.9 % IV SOLN: 250.0000 mL | INTRAVENOUS | Status: DC | PRN

## 2016-05-04 MED ORDER — HEPARIN (PORCINE) IN NACL 2-0.9 UNIT/ML-% IJ SOLN
INTRAMUSCULAR | Status: AC
  Filled 2016-05-04: qty 1000

## 2016-05-04 MED ORDER — HEPARIN (PORCINE) IN NACL 2-0.9 UNIT/ML-% IJ SOLN
INTRAMUSCULAR | Status: DC | PRN
Start: 2016-05-04 — End: 2016-05-04

## 2016-05-04 MED ORDER — FENTANYL CITRATE (PF) 100 MCG/2ML IJ SOLN
INTRAMUSCULAR | Status: DC | PRN
  Administered 2016-05-04: 25 ug via INTRAVENOUS

## 2016-05-04 MED ORDER — LIDOCAINE HCL (PF) 1 % IJ SOLN
INTRAMUSCULAR | Status: AC
  Filled 2016-05-04: qty 30

## 2016-05-04 MED ORDER — VERAPAMIL HCL 2.5 MG/ML IV SOLN
INTRAVENOUS | Status: DC | PRN
  Administered 2016-05-04: 10 mL via INTRA_ARTERIAL

## 2016-05-04 MED ORDER — HEPARIN SODIUM (PORCINE) 1000 UNIT/ML IJ SOLN
INTRAMUSCULAR | Status: AC
  Filled 2016-05-04: qty 1

## 2016-05-04 MED ORDER — SODIUM CHLORIDE 0.9 % WEIGHT BASED INFUSION
3.0000 mL/kg/h | INTRAVENOUS | Status: DC
  Administered 2016-05-04: 3 mL/kg/h via INTRAVENOUS

## 2016-05-04 MED ORDER — VITAMIN D 1000 UNITS PO TABS
1000.0000 [IU] | ORAL_TABLET | Freq: Every evening | ORAL | Status: DC
  Filled 2016-05-04: qty 1

## 2016-05-04 MED ORDER — ASPIRIN 81 MG PO CHEW: 81.0000 mg | CHEWABLE_TABLET | ORAL | Status: DC

## 2016-05-04 MED ORDER — INFLUENZA VAC SPLIT QUAD 0.5 ML IM SUSY
0.5000 mL | PREFILLED_SYRINGE | INTRAMUSCULAR | Status: AC
  Administered 2016-05-05: 0.5 mL via INTRAMUSCULAR
  Filled 2016-05-04: qty 0.5

## 2016-05-04 MED ORDER — HEPARIN SODIUM (PORCINE) 1000 UNIT/ML IJ SOLN
INTRAMUSCULAR | Status: DC | PRN
  Administered 2016-05-04: 4000 [IU] via INTRAVENOUS
  Administered 2016-05-04: 5000 [IU] via INTRAVENOUS
  Administered 2016-05-04: 2000 [IU] via INTRAVENOUS

## 2016-05-04 MED ORDER — CLOPIDOGREL BISULFATE 75 MG PO TABS
75.0000 mg | ORAL_TABLET | Freq: Every evening | ORAL | Status: DC
  Administered 2016-05-04: 75 mg via ORAL
  Filled 2016-05-04: qty 1

## 2016-05-04 MED ORDER — ACETAMINOPHEN 325 MG PO TABS: 650.0000 mg | ORAL_TABLET | ORAL | Status: DC | PRN

## 2016-05-04 MED ORDER — NITROGLYCERIN 1 MG/10 ML FOR IR/CATH LAB
INTRA_ARTERIAL | Status: DC | PRN
  Administered 2016-05-04: 200 ug via INTRA_ARTERIAL

## 2016-05-04 MED ORDER — SODIUM CHLORIDE 0.9 % WEIGHT BASED INFUSION: 1.0000 mL/kg/h | INTRAVENOUS | Status: DC

## 2016-05-04 MED ORDER — SODIUM CHLORIDE 0.9% FLUSH: 3.0000 mL | Freq: Two times a day (BID) | INTRAVENOUS | Status: DC

## 2016-05-04 MED ORDER — DIPHENHYDRAMINE HCL 50 MG/ML IJ SOLN
INTRAMUSCULAR | Status: AC
  Filled 2016-05-04: qty 1

## 2016-05-04 MED ORDER — FENTANYL CITRATE (PF) 100 MCG/2ML IJ SOLN
INTRAMUSCULAR | Status: AC
  Filled 2016-05-04: qty 2

## 2016-05-04 MED ORDER — SODIUM CHLORIDE 0.9 % WEIGHT BASED INFUSION
3.0000 mL/kg/h | INTRAVENOUS | Status: AC
  Administered 2016-05-04 (×2): 3 mL/kg/h via INTRAVENOUS

## 2016-05-04 MED ORDER — AMLODIPINE BESYLATE 5 MG PO TABS
5.0000 mg | ORAL_TABLET | Freq: Every evening | ORAL | Status: DC
  Administered 2016-05-04: 5 mg via ORAL
  Filled 2016-05-04: qty 1

## 2016-05-04 MED ORDER — POLYETHYLENE GLYCOL 3350 17 G PO PACK: 17.0000 g | PACK | Freq: Every day | ORAL | Status: DC | PRN

## 2016-05-04 MED ORDER — MIDAZOLAM HCL 2 MG/2ML IJ SOLN
INTRAMUSCULAR | Status: AC
  Filled 2016-05-04: qty 2

## 2016-05-04 MED ORDER — NITROGLYCERIN 1 MG/10 ML FOR IR/CATH LAB
INTRA_ARTERIAL | Status: AC
  Filled 2016-05-04: qty 10

## 2016-05-04 MED ORDER — LIDOCAINE HCL (PF) 1 % IJ SOLN
INTRAMUSCULAR | Status: DC | PRN
  Administered 2016-05-04: 2 mL via INTRADERMAL

## 2016-05-04 MED ORDER — MIDAZOLAM HCL 2 MG/2ML IJ SOLN
INTRAMUSCULAR | Status: DC | PRN
  Administered 2016-05-04: 1 mg via INTRAVENOUS

## 2016-05-04 MED ORDER — ALLOPURINOL 100 MG PO TABS
200.0000 mg | ORAL_TABLET | Freq: Every evening | ORAL | Status: DC
  Administered 2016-05-04: 200 mg via ORAL
  Filled 2016-05-04: qty 2

## 2016-05-04 MED ORDER — IOPAMIDOL (ISOVUE-370) INJECTION 76%
INTRAVENOUS | Status: DC | PRN
  Administered 2016-05-04: 100 mL via INTRA_ARTERIAL

## 2016-05-04 MED ORDER — VERAPAMIL HCL 2.5 MG/ML IV SOLN
INTRAVENOUS | Status: AC
  Filled 2016-05-04: qty 2

## 2016-05-04 MED ORDER — INSULIN ASPART 100 UNIT/ML ~~LOC~~ SOLN: 0.0000 [IU] | Freq: Every day | SUBCUTANEOUS | Status: DC

## 2016-05-04 MED ORDER — IOPAMIDOL (ISOVUE-370) INJECTION 76%
INTRAVENOUS | Status: AC
  Filled 2016-05-04: qty 50

## 2016-05-04 MED ORDER — HEPARIN (PORCINE) IN NACL 2-0.9 UNIT/ML-% IJ SOLN
INTRAMUSCULAR | Status: DC | PRN
  Administered 2016-05-04: 1000 mL

## 2016-05-04 MED ORDER — IOPAMIDOL (ISOVUE-370) INJECTION 76%
INTRAVENOUS | Status: AC
  Filled 2016-05-04: qty 100

## 2016-05-04 MED ORDER — INSULIN ASPART 100 UNIT/ML ~~LOC~~ SOLN
0.0000 [IU] | Freq: Three times a day (TID) | SUBCUTANEOUS | Status: DC
  Administered 2016-05-04: 2 [IU] via SUBCUTANEOUS

## 2016-05-04 MED ORDER — COLCHICINE 0.6 MG PO TABS: 0.6000 mg | ORAL_TABLET | Freq: Every day | ORAL | Status: DC | PRN

## 2016-05-04 MED ORDER — ONDANSETRON HCL 4 MG/2ML IJ SOLN: 4.0000 mg | Freq: Four times a day (QID) | INTRAMUSCULAR | Status: DC | PRN

## 2016-05-04 SURGICAL SUPPLY — 16 items
BALLN EMERGE MR 2.5X15 (BALLOONS) ×2
BALLN ~~LOC~~ TREK RX 4.0X8 (BALLOONS) ×2
BALLOON EMERGE MR 2.5X15 (BALLOONS) IMPLANT
BALLOON ~~LOC~~ TREK RX 4.0X8 (BALLOONS) IMPLANT
CATH IMPULSE 5F ANG/FL3.5 (CATHETERS) ×1 IMPLANT
CATH VISTA GUIDE 6FR JR4 (CATHETERS) ×1 IMPLANT
DEVICE RAD COMP TR BAND LRG (VASCULAR PRODUCTS) ×1 IMPLANT
GLIDESHEATH SLEND SS 6F .021 (SHEATH) ×1 IMPLANT
KIT ENCORE 26 ADVANTAGE (KITS) ×1 IMPLANT
KIT HEART LEFT (KITS) ×2 IMPLANT
PACK CARDIAC CATHETERIZATION (CUSTOM PROCEDURE TRAY) ×2 IMPLANT
STENT PROMUS PREM MR 3.5X16 (Permanent Stent) ×1 IMPLANT
TRANSDUCER W/STOPCOCK (MISCELLANEOUS) ×2 IMPLANT
TUBING CIL FLEX 10 FLL-RA (TUBING) ×2 IMPLANT
WIRE HI TORQ WHISPER MS 190CM (WIRE) ×1 IMPLANT
WIRE SAFE-T 1.5MM-J .035X260CM (WIRE) ×1 IMPLANT

## 2016-05-04 NOTE — Interval H&P Note (Signed)
Cath Lab Visit (complete for each Cath Lab visit)  Clinical Evaluation Leading to the Procedure:   ACS: No.  Non-ACS:    Anginal Classification: CCS II  Anti-ischemic medical therapy: Minimal Therapy (1 class of medications)  Non-Invasive Test Results: High-risk stress test findings: cardiac mortality >3%/year  Prior CABG: No previous CABG      History and Physical Interval Note:  05/04/2016 10:28 AM  Paul Choi  has presented today for surgery, with the diagnosis of Abnormal Stress Test  The various methods of treatment have been discussed with the patient and family. After consideration of risks, benefits and other options for treatment, the patient has consented to  Procedure(s): Left Heart Cath and Coronary Angiography (N/A) as a surgical intervention .  The patient's history has been reviewed, patient examined, no change in status, stable for surgery.  I have reviewed the patient's chart and labs.  Questions were answered to the patient's satisfaction.     Sherren Mocha

## 2016-05-04 NOTE — Care Management Note (Signed)
Case Management Note  Patient Details  Name: Paul Choi MRN: [id number] Date of Birth: [birth year]  Subjective/Objective:  Patient is s/p coronary intervention, on plavix and asa, NCM will cont to follow for dc needs.                   Action/Plan:   Expected Discharge Date:                  Expected Discharge Plan:  Home/Self Care  In-House Referral:     Discharge planning Services  CM Consult  Post Acute Care Choice:    Choice offered to:     DME Arranged:    DME Agency:     HH Arranged:    HH Agency:     Status of Service:  In process, will continue to follow  If discussed at Long Length of Stay Meetings, dates discussed:    Additional Comments:  Zenon Mayo, RN 05/04/2016, 12:12 PM

## 2016-05-04 NOTE — H&P (View-Only) (Signed)
Cardiology Office Note Date:  04/27/2016   ID:  Paul Choi, DOB [birth year], MRN [id number]  PCP:  Adin Hector, MD  Cardiologist:  Dr Jolyn Nap Referring: Dr Jolyn Nap  Chief Complaint  Patient presents with  . Shortness of Breath     History of Present Illness: Paul Choi is a 70 y.o. male who presents for evaluation of CAD and an abnormal stress test.   The patient has been followed over time for hypertension and hyperlipidemia. He's been on long-term aspirin. He underwent a heart catheterization in 2003 after an abnormal stress test. This demonstrated total occlusion of the left circumflex and otherwise mild nonobstructive coronary artery disease. Medical therapy was recommended. He has had no clinical history of a myocardial infarction. He's been seen by Dr. Caryl Comes and has undergone a cardiac testing with an echocardiogram, nuclear stress test, and gated cardiac CTA scan.  The echocardiogram was done in June 2017 and showed normal LV function, mildly dilated aortic root, and raise the possibility of a functionally bicuspid aortic valve. 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 The patient's coronary calcium score of 750 placed him in the 75th percentile for age and sex matched control. He has a right dominant coronary anatomy with severe 70-99% stenosis of the proximal right coronary artery. The left main and LAD are patent with mild to moderate calcified plaque in the mid LAD and maximum stenosis estimated less than 50%. The left circumflex is small in caliber and gives rise to an obtuse marginal branch that is occluded proximally. The patient's nuclear scan demonstrated an LVEF of 42% and was considered moderate to high risk because of a predominantly fixed defect in the basal to mid lateral and inferolateral walls and a small to moderate reversible defect in  the mid to apical anterior and anteroseptal walls.  From a symptomatic standpoint, the patient is doing well. He has not engaged in aerobic exercise, but he is able to walk about two thirds of a mile before resting. States that he does need to stop and rest after that amount of time. May have shortness of breath associated with this but denies any severe symptoms. He specifically denies any symptoms of chest pain, chest pressure, or lightheadedness. He denies edema or calf claudication symptoms.   Past Medical History:  Diagnosis Date  . Arteriosclerotic cardiovascular disease   . Arthritis 1997  . Coronary artery disease    Cardiac catheterization in August 2003 with occluded left circumflex. Echo 8/13 with mild segmental LV dysfunction, EF 40%, moderate mitral insufficiency and mild tricuspid insufficiency; aortic valve appears bicuspid  . ED (erectile dysfunction)   . Gout   . Heart disease   . Hyperlipidemia   . Hypertension 1982  . Insomnia   . Personal history of colonic polyps   . Renal insufficiency     Past Surgical History:  Procedure Laterality Date  . CARDIAC CATHETERIZATION  2006  . COLONOSCOPY  2007  . HERNIA REPAIR    . KNEE SURGERY  1988    Current Outpatient Prescriptions  Medication Sig Dispense Refill  . allopurinol (ZYLOPRIM) 100 MG tablet Take 200 mg by mouth daily.     Marland Kitchen amLODipine (NORVASC) 5 MG tablet Take 5 mg by mouth daily.    Marland Kitchen aspirin 81 MG tablet Take 81 mg by mouth daily.    Marland Kitchen atorvastatin (  LIPITOR) 40 MG tablet Take 1 tablet (40 mg total) by mouth daily. 90 tablet 3  . cholecalciferol (VITAMIN D) 1000 units tablet Take 1,000 Units by mouth daily.    . colchicine 0.6 MG tablet Take 0.6 mg by mouth daily.    . Fish Oil-Cholecalciferol (FISH OIL + D3 PO) Take by mouth.    . losartan (COZAAR) 100 MG tablet Take 100 mg by mouth daily.    . clopidogrel (PLAVIX) 75 MG tablet Take 1 tablet (75 mg total) by mouth daily. 30 tablet 3  . famotidine (PEPCID)  20 MG tablet Take 1 tablet the night before and the morning of procedure 2 tablet 0  . predniSONE (DELTASONE) 20 MG tablet Take 2 tablets (40 mg) on the night before and the morning of procedure 4 tablet 0   No current facility-administered medications for this visit.     Allergies:   Ace inhibitors; Contrast media [iodinated diagnostic agents]; Lovastatin; Other; Shellfish allergy; and Trazodone   Social History:  The patient  reports that he quit smoking about 20 years ago. He has a 25.00 pack-year smoking history. He has never used smokeless tobacco. He reports that he drinks alcohol. He reports that he does not use drugs.   Family History:  The patient's family history includes Hypertension in his brother and mother. Father lived to age 62, mother lived to 81. No premature CAD in siblings or parents. Oldest son had cardiac surgery at 22 yo for bicuspid aortic valve stenosis.   ROS:  Please see the history of present illness.  Otherwise, review of systems is positive for easy bruising.  All other systems are reviewed and negative.    PHYSICAL EXAM: VS:  BP (!) 148/82   Pulse 71   Ht 6\' 1"  (1.854 m)   Wt 90.2 kg (198 lb 12.8 oz)   SpO2 98%   BMI 26.23 kg/m  , BMI Body mass index is 26.23 kg/m. GEN: Well nourished, well developed, in no acute distress  HEENT: normal  Neck: no JVD, no masses. No carotid bruits Cardiac: RRR with 2/6 systolic murmur at the RUSB        Respiratory:  clear to auscultation bilaterally, normal work of breathing GI: soft, nontender, nondistended, + BS MS: no deformity or atrophy  Ext: no pretibial edema, pedal pulses 2+= bilaterally Skin: warm and dry, no rash Neuro:  Strength and sensation are intact Psych: euthymic mood, full affect  EKG:  EKG is not ordered today.  Recent Labs: 04/26/2016: BUN 28; Creat 1.49; Hemoglobin 13.8; Platelets 254; Potassium 4.7; Sodium 134   Lipid Panel  No results found for: CHOL, TRIG, HDL, CHOLHDL, VLDL, LDLCALC,  LDLDIRECT    Wt Readings from Last 3 Encounters:  04/26/16 90.2 kg (198 lb 12.8 oz)  02/02/16 90.5 kg (199 lb 8 oz)     Cardiac Studies Reviewed: 2D Echo 02-10-2016: Study Conclusions  - Left ventricle: The cavity size was normal. Systolic function was   normal. The estimated ejection fraction was in the range of 55%   to 60%. Wall motion was normal; there were no regional wall   motion abnormalities. Doppler parameters are consistent with   abnormal left ventricular relaxation (grade 1 diastolic   dysfunction). - Aortic valve: There was trivial regurgitation. - Aorta: Ascending aorta is mildly dilated, diameter: 41 mm (S). - Aortic root: The aortic root was mildly dilated, 4 cm. Aortic   arch is normal in size. - Left atrium: The atrium was  normal in size. - Right ventricle: Systolic function was normal. - Pulmonary arteries: Systolic pressure was within the normal   range.  Myoview: Study Result   Exercise myocardial perfusion imaging study with moderate sized region of predominantly fixed defect in the basal to mid lateral and inferolateral wall of moderate intensity on nonattenuation corrected and attenuation corrected images. Small region of peri-infarct ischemia noted.   There is also a small to moderate sized region of mild ischemia noted in the mid to apical anterior and anteroseptal wall on attenuation corrected images  Lateral wall hypokinesis noted,  EF estimated at 42% No EKG changes concerning for ischemia at peak stress or in recovery, PVCs noted Moderate to high risk scan    CT Coronary: FINDINGS: Aortic Valve: Anatomically tricuspid, but functionally bicuspid aortic valve with co-joined left and right aortic leaflet. The leaflets are mildly thickened and calcified with minimally restricted leaflet opening. Aorta: Aneurysmal ascending aorta with mild diffuse calcifications. Moderate diffuse calcifications in the aortic arch predominantly around the  origin of the brachiocephalic arteries. Sinotubular Junction:  40 x 37 mm Ascending Thoracic Aorta:  43 x 42 mm Aortic Arch:  28 x 24 mm Descending Thoracic Aorta:  28 x 28 mm Sinus of Valsalva Measurements: Non-coronary:  38 mm Right -coronary:  38 mm Left -coronary:  40 mm Coronary Arteries: 1. Coronary calcium score of 750. This was 70 percentile for age and sex matched control. 2.  Normal coronary origin, right dominance. RCA is a large caliber artery that gives rise to PDA. There are moderate diffuse calcifications in the proximal to mid RCA with mild to moderate stenoses, however there is a focal severe stenosis in the proximal RCA with associated stenosis > 70%. LM is a long artery with mild calcified plaque in the distal portion and minimal stenosis. LAD is a large vessel that gives rise to two large diagonal branches. There is mild to moderate calcified plaque in the proximal to mid LAD with maximum stenosis 50%. There is a moderate plaque in the 2.diagonal branch with associated stenosis 50-69%. LCX artery is small caliber, gives rise to one small OM branch and is occluded in its proximal portion. Other findings: Dilated pulmonary artery measuring 32 x 30 mm suggestive of pulmonary hypertension. Normal pulmonary vein drainage into the left atrium. A small left atrial appendage with no evidence of a thrombus. IMPRESSION: 1. Anatomically tricuspid, but functionally bicuspid aortic valve with co-joined left and right aortic leaflet and minimally restricted leaflet opening with no evidence of stenosis. 2. Mildly dilated aortic root with maximum diameter 40 mm. 3. 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 An annual follow up with CTA or MRA is recommended. No prior study is available for comparison. 4. Coronary calcium score of 750. This was 5 percentile for age and sex matched control. 5. Diffuse mild to moderate CAD, with a severe mixed  plaque in the proximal RCA associated with 70-99% stenosis. A stress test is Recommended.  ASSESSMENT AND PLAN: 1.  CAD, native vessel, with a moderate/high-risk nuclear scan and no clear angina. The patient is on appropriate risk-reduction medical program with ASA and a statin drug. We reviewed all of his imaging studies to date. We discussed available data regarding patient's without clear anginal symptoms. The patient has a remote history of cardiac catheterization demonstrating total occlusion of an OM branch and the patient apparently was asymptomatic at that time also. I think he is at risk of complications  from silent ischemia considering his moderate to high risk findings from noninvasive testing. We specifically reviewed the pros and cons of medical therapy versus cardiac catheterization and possible PCI. He favors definitive evaluation and I agree this is reasonable. I have reviewed the risks, indications, and alternatives to cardiac catheterization, possible angioplasty, and stenting with the patient. Risks include but are not limited to bleeding, infection, vascular injury, stroke, myocardial infection, arrhythmia, kidney injury, radiation-related injury in the case of prolonged fluoroscopy use, emergency cardiac surgery, and death. The patient understands the risks of serious complication is 1-2 in [id number] with diagnostic cardiac cath and 1-2% or less with angioplasty/stenting.   The patient had a contrast reaction with his CAT scan and he will be premedicated with prednisone and Benadryl for cardiac catheterization.  2. 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 Annual imaging surveillance is recommended. The patient probably should be started on a low-dose beta blocker. Will review at the time of his catheterization.  3. Hyperlipidemia: Patient is on a statin drug, followed by his primary physician.  Current medicines are reviewed with the patient  today.  The patient does not have concerns regarding medicines.  Labs/ tests ordered today include:   Orders Placed This Encounter  Procedures  . Basic Metabolic Panel (BMET)  . CBC with Differential  . INR/PT    Disposition:   Pending cardiac catheterization results  Signed, Sherren Mocha, MD  04/27/2016 6:13 AM    [facility] [address], [place], [place]  [address] Phone: [phone]; Fax: [phone]

## 2016-05-04 NOTE — Research (Signed)
CADLAD Informed Consent   Subject Name: Paul Choi  Subject met inclusion and exclusion criteria.  The informed consent form, study requirements and expectations were reviewed with the subject and questions and concerns were addressed prior to the signing of the consent form.  The subject verbalized understanding of the trail requirements.  The subject agreed to participate in the CADLAD trial and signed the informed consent.  The informed consent was obtained prior to performance of any protocol-specific procedures for the subject.  A copy of the signed informed consent was given to the subject and a copy was placed in the subject's medical record.  Jennifer Knapp 05/04/2016, 2:03 PM  

## 2016-05-04 NOTE — Progress Notes (Addendum)
S/W DEJA @ [facility] # [phone]    BRILINTA 90 MG BID  COVER- YES  CO-PAY- ZERO DOLLARS  PRIOR APPROVAL - NO  PHARMACY : ANY RETAIL    benefit check in case was going to be on brilinta, looks like he will be on plavix

## 2016-05-05 DIAGNOSIS — I10 Essential (primary) hypertension: Secondary | ICD-10-CM

## 2016-05-05 DIAGNOSIS — Z955 Presence of coronary angioplasty implant and graft: Secondary | ICD-10-CM | POA: Diagnosis not present

## 2016-05-05 DIAGNOSIS — I129 Hypertensive chronic kidney disease with stage 1 through stage 4 chronic kidney disease, or unspecified chronic kidney disease: Secondary | ICD-10-CM | POA: Diagnosis not present

## 2016-05-05 DIAGNOSIS — E785 Hyperlipidemia, unspecified: Secondary | ICD-10-CM | POA: Diagnosis not present

## 2016-05-05 DIAGNOSIS — N183 Chronic kidney disease, stage 3 unspecified: Secondary | ICD-10-CM

## 2016-05-05 DIAGNOSIS — I2510 Atherosclerotic heart disease of native coronary artery without angina pectoris: Secondary | ICD-10-CM

## 2016-05-05 DIAGNOSIS — R931 Abnormal findings on diagnostic imaging of heart and coronary circulation: Secondary | ICD-10-CM

## 2016-05-05 LAB — BASIC METABOLIC PANEL
Anion gap: 6 (ref 5–15)
BUN: 27 mg/dL — AB (ref 6–20)
CALCIUM: 9 mg/dL (ref 8.9–10.3)
CHLORIDE: 108 mmol/L (ref 101–111)
CO2: 23 mmol/L (ref 22–32)
CREATININE: 1.51 mg/dL — AB (ref 0.61–1.24)
GFR calc non Af Amer: 44 mL/min — ABNORMAL LOW (ref >60)
GFR, EST AFRICAN AMERICAN: 51 mL/min — AB (ref >60)
GLUCOSE: 116 mg/dL — AB (ref 65–99)
Potassium: 4 mmol/L (ref 3.5–5.1)
Sodium: 137 mmol/L (ref 135–145)

## 2016-05-05 LAB — CBC
HCT: 38.3 % — ABNORMAL LOW (ref 39.0–52.0)
Hemoglobin: 12.3 g/dL — ABNORMAL LOW (ref 13.0–17.0)
MCH: 29.5 pg (ref 26.0–34.0)
MCHC: 32.1 g/dL (ref 30.0–36.0)
MCV: 91.8 fL (ref 78.0–100.0)
Platelets: 218 10*3/uL (ref 150–400)
RBC: 4.17 MIL/uL — ABNORMAL LOW (ref 4.22–5.81)
RDW: 15 % (ref 11.5–15.5)
WBC: 8.2 10*3/uL (ref 4.0–10.5)

## 2016-05-05 LAB — GLUCOSE, CAPILLARY: GLUCOSE-CAPILLARY: 112 mg/dL — AB (ref 65–99)

## 2016-05-05 MED ORDER — LOSARTAN POTASSIUM 100 MG PO TABS: 100.0000 mg | ORAL_TABLET | Freq: Every evening | ORAL | Status: AC

## 2016-05-05 MED ORDER — ATORVASTATIN CALCIUM 40 MG PO TABS: 40.0000 mg | ORAL_TABLET | Freq: Every evening | ORAL | Status: DC

## 2016-05-05 MED ORDER — AMLODIPINE BESYLATE 5 MG PO TABS: 5.0000 mg | ORAL_TABLET | Freq: Every evening | ORAL | Status: DC

## 2016-05-05 MED ORDER — AMLODIPINE BESYLATE 10 MG PO TABS: 10.0000 mg | ORAL_TABLET | Freq: Every evening | ORAL | Status: DC

## 2016-05-05 MED ORDER — CLOPIDOGREL BISULFATE 75 MG PO TABS: 75.0000 mg | ORAL_TABLET | Freq: Every evening | ORAL | Status: DC

## 2016-05-05 NOTE — Progress Notes (Signed)
CARDIAC REHAB PHASE I   PRE:  Rate/Rhythm: 86  BP:  Sitting: 122/83     SaO2: 98%  MODE:  Ambulation: 150 ft (patient's IV started bleeding so we had to go back to the room)  POST:  Rate/Rhythm: 90  BP:  Sitting: 154/86     SaO2: 98% ra  9:05am-9:40am Patient ambulated at a steady pace. No complaints. Education completed with son and wife in room. Patient is interested in Cardiac Rehab at [facility].   Gleason, MS 05/05/2016 9:37 AM

## 2016-05-05 NOTE — Discharge Summary (Addendum)
Discharge Summary    Patient ID: Paul Choi,  MRN: [id number], DOB/AGE: [birth year] 70 y.o.  Admit date: 05/04/2016 Discharge date: 05/05/2016  Primary Care Provider: [facility] Primary Cardiologist: Dr. Virl Axe [facility])  Discharge Diagnoses    Principal Problem:   Coronary artery disease involving native heart without angina pectoris Active Problems:   Abnormal nuclear cardiac imaging test   CKD (chronic kidney disease) stage 3, GFR 30-59 ml/min   Essential hypertension   HLD (hyperlipidemia)   Allergies Allergies  Allergen Reactions  . Ace Inhibitors Cough  . Contrast Media [Iodinated Diagnostic Agents] Other (See Comments)    Cough and difficulty breathing   . Lovastatin Other (See Comments)    Myalgia at 80-mg dose  . Other Nausea Only and Hives  . Shellfish Allergy Hives  . Trazodone Other (See Comments)    "felt like zombie"    Diagnostic Studies/Procedures    LHC 05/04/16 LM patent LAD proximal 20-30%, mid tortuous 30-40% RI 60% at bifurcation LCx proximal occluded, OM filled by collaterals (left and right) RCA proximal 95%, mid 40% PCI: 3.5 x 16 mm Promus DES 1. Critical stenosis of the proximal RCA treated successfully with coronary stenting using a 3.5 x 16 mm Promus drug-eluting stent 2. Chronic occlusion of the mid circumflex with the first OM branch collateralized from the LAD and right coronary artery 3. Moderate stenosis of a large ramus intermedius branch 4. Diffuse nonobstructive stenosis of the LAD 5. Normal LV function by noninvasive assessment DAPT with ASA and plavix x 12 months. Could consider Twilight Protocol as well (pt would need to be transitioned to Brilinta)         _____________   History of Present Illness     Paul Choi is a 70 y.o. male with hx of CAD and known CTO of LCx by [facility] in 2003 who was recently seen by Dr. Virl Axe for an abnormal ECG.  Myoview was obtained and mod to high risk with  predominantly fixed defect in the basal to mid lateral and inferolateral walls and a small to moderate reversible defect in the mid to apical anterior and anteroseptal walls. Coronary CTA demonstrated Ca score 750 and diffuse mild to mod CAD with severe mixed plaque in proximal RCA with 70-99% stenosis.  Echo demonstrated EF 55-60%, Gr 1 DD, mild dilated ascending aorta at 41 mm and possible functional bicuspid AV.  He was seen by Dr. Sherren Mocha set up for [facility] Course     Consultants: None    The patient presented electively yesterday for cardiac catheterization which demonstrated CTO of LCx and high grade proximal RCA stenosis.  The RCA was was treated with DES.   He tolerated the procedure well and had no immediate complications.  He denies chest pain this AM.  His R wrist is without hematoma.  His BP has been somewhat elevated but his Losartan was held for his catheterization.  This will be resumed tomorrow.  If his BP remains high at FU, consider increasing Amlodipine to 10 mg QD.   He was offered the [facility] but declined.  He will need to remain on ASA + Plavix for a minimum of 12 mos.  He plans to pursue cardiac rehab in [place]. His creatinine remained stable post cath/PCI.  He should have a repeat BMET in 1 week.  He was evaluated by Dr. Sanda Klein today and felt to be stable and ready for DC  to home.    Of note, the patient is not on a beta-blocker because he did not have ACS and he does not have LV dysfunction.  He underwent cardiac catheterization and percutaneous coronary intervention because his nuclear stress test was felt to be moderate to high risk.   _____________  Discharge Vitals Blood pressure (!) 152/85, pulse 76, temperature 97.4 F (36.3 C), temperature source Oral, resp. rate 14, height 6\' 1"  (1.854 m), weight 194 lb (88 kg), SpO2 98 %.  Filed Weights   05/04/16 0847  Weight: 194 lb (88 kg)    Labs & Radiologic Studies    CBC  Recent Labs   05/05/16 0327  WBC 8.2  HGB 12.3*  HCT 38.3*  MCV 91.8  PLT 99991111   Basic Metabolic Panel  Recent Labs  05/05/16 0327  NA 137  K 4.0  CL 108  CO2 23  GLUCOSE 116*  BUN 27*  CREATININE 1.51*  CALCIUM 9.0   _____________  Nm Myocar Multi W/spect W/wall Motion / Ef  Result Date: 04/11/2016 Exercise myocardial perfusion imaging study with moderate sized region of predominantly fixed defect in the basal to mid lateral and inferolateral wall of moderate intensity on nonattenuation corrected and attenuation corrected images. Small region of peri-infarct ischemia noted. There is also a small to moderate sized region of mild ischemia noted in the mid to apical anterior and anteroseptal wall on attenuation corrected images Lateral wall hypokinesis noted,  EF estimated at 42% No EKG changes concerning for ischemia at peak stress or in recovery, PVCs noted Moderate to high risk scan Signed, Esmond Plants, MD, Ph.D [facility]   Disposition   Pt is being discharged home today in good condition.  Follow-up Plans & Appointments    Follow-up Information    Virl Axe, MD Follow up in 1 week(s).   Specialty:  Cardiology Why:  The office will call to arrange follow up in 1-2 weeks with Dr. Caryl Comes or the PA  Contact information: [place] [address] [phone]        [place] Follow up in 1 week(s).   Specialty:  Cardiology Why:  The office will call to arrange lab work: BMET  Contact information: [address], [place] [phone]         Discharge Instructions    AMB Referral to Phase II Cardiac Rehab    Complete by:  As directed    Diagnosis:  Coronary Stents   Amb Referral to Cardiac Rehabilitation    Complete by:  As directed    Diagnosis:  Coronary Stents   Diet - low sodium heart healthy    Complete by:  As directed    Discharge wound care:    Complete by:  As  directed    Call our office for any swelling, bleeding, bruising or fever   Driving Restrictions    Complete by:  As directed    No driving for 2 days   Increase activity slowly    Complete by:  As directed    Lifting restrictions    Complete by:  As directed    No lifting for 1 week with your right arm      Discharge Medications   Current Discharge Medication List    CONTINUE these medications which have CHANGED   Details  atorvastatin (LIPITOR) 40 MG tablet Take 1 tablet (40 mg total) by mouth every evening.  clopidogrel (PLAVIX) 75 MG tablet Take 1 tablet (75 mg total) by mouth every evening.    losartan (COZAAR) 100 MG tablet Take 1 tablet (100 mg total) by mouth every evening.      CONTINUE these medications which have NOT CHANGED   Details  allopurinol (ZYLOPRIM) 100 MG tablet Take 200 mg by mouth every evening.     amLODipine (NORVASC) 5 MG tablet Take 5 mg by mouth every evening.     aspirin EC 81 MG tablet Take 81 mg by mouth daily.    cholecalciferol (VITAMIN D) 1000 units tablet Take 1,000 Units by mouth every evening.     docusate sodium (COLACE) 100 MG capsule Take 200-300 mg by mouth daily as needed for mild constipation.    polyethylene glycol (MIRALAX / GLYCOLAX) packet Take 17 g by mouth daily as needed for mild constipation.    colchicine 0.6 MG tablet Take 0.6 mg by mouth daily as needed (for gout flare ups.).     Fish Oil-Cholecalciferol (FISH OIL + D3 PO) Take 1 capsule by mouth daily.       STOP taking these medications     famotidine (PEPCID) 20 MG tablet      predniSONE (DELTASONE) 20 MG tablet            Outstanding Labs/Studies   1. BMET in 1 week.   Duration of Discharge Encounter  Greater than 30 minutes including physician time.  Signed, Richardson Dopp, PA-C  05/05/2016, 10:54 AM

## 2016-05-05 NOTE — Progress Notes (Addendum)
Pt given discharge instructions, no further questions. RN removed IV, Pt with family and dressed for home.

## 2016-05-05 NOTE — Progress Notes (Addendum)
Patient Name: Paul Choi Date of Encounter: 05/05/2016  Primary Cardiologist: Dr. Virl Axe [facility] Problem List     Principal Problem:   Coronary artery disease involving native heart without angina pectoris Active Problems:   Abnormal nuclear cardiac imaging test   CKD (chronic kidney disease) stage 3, GFR 30-59 ml/min   Essential hypertension   HLD (hyperlipidemia)     Subjective   No chest pain, dyspnea.  Walked with [place] this AM.   Inpatient Medications    . allopurinol  200 mg Oral QPM  . amLODipine  5 mg Oral QPM  . aspirin EC  81 mg Oral Daily  . atorvastatin  40 mg Oral QPM  . cholecalciferol  1,000 Units Oral QPM  . clopidogrel  75 mg Oral QPM  . insulin aspart  0-5 Units Subcutaneous QHS  . insulin aspart  0-9 Units Subcutaneous TID WC  . sodium chloride flush  3 mL Intravenous Q12H    Vital Signs    Vitals:   05/05/16 0500 05/05/16 0600 05/05/16 0700 05/05/16 0800  BP:    (!) 152/85  Pulse:      Resp: 14 14 13 14   Temp:    97.4 F (36.3 C)  TempSrc:    Oral  SpO2:    98%  Weight:      Height:        Intake/Output Summary (Last 24 hours) at 05/05/16 1003 Last data filed at 05/04/16 2215  Gross per 24 hour  Intake              840 ml  Output              600 ml  Net              240 ml   Filed Weights   05/04/16 0847  Weight: 194 lb (88 kg)    Physical Exam    GEN: Well nourished, well developed, in no acute distress.  HEENT: Grossly normal.  Neck: Supple, no JVD  Cardiac: RRR, no murmurs, rubs, or gallops. No edema.  R wrist without hematoma or mass  Respiratory:  Respirations regular and unlabored, clear to auscultation bilaterally. GI: Soft, nontender  MS: no deformity or atrophy. Skin: warm and dry, no rash. Neuro:  Strength and sensation are intact. Psych: AAOx3.  Normal affect.  Labs    CBC  Recent Labs  05/05/16 0327  WBC 8.2  HGB 12.3*  HCT 38.3*  MCV 91.8  PLT 99991111   Basic Metabolic  Panel  Recent Labs  05/05/16 0327  NA 137  K 4.0  CL 108  CO2 23  GLUCOSE 116*  BUN 27*  CREATININE 1.51*  CALCIUM 9.0   Liver Function Tests No results for input(s): AST, ALT, ALKPHOS, BILITOT, PROT, ALBUMIN in the last 72 hours. No results for input(s): LIPASE, AMYLASE in the last 72 hours. Cardiac Enzymes No results for input(s): CKTOTAL, CKMB, CKMBINDEX, TROPONINI in the last 72 hours. BNP Invalid input(s): POCBNP D-Dimer No results for input(s): DDIMER in the last 72 hours. Hemoglobin A1C No results for input(s): HGBA1C in the last 72 hours. Fasting Lipid Panel No results for input(s): CHOL, HDL, LDLCALC, TRIG, CHOLHDL, LDLDIRECT in the last 72 hours. Thyroid Function Tests No results for input(s): TSH, T4TOTAL, T3FREE, THYROIDAB in the last 72 hours.  Invalid input(s): FREET3  Telemetry    NSR, PVCs  ECG    NSR, HR 67, PVCs, no change from prior  tracing  Radiology    No results found.   LHC 05/04/16 LM patent LAD proximal 20-30%, mid tortuous 30-40% RI 60% at bifurcation LCx proximal occluded, OM filled by collaterals (left and right) RCA proximal 95%, mid 40% PCI: 3.5 x 16 mm Promus DES 1. Critical stenosis of the proximal RCA treated successfully with coronary stenting using a 3.5 x 16 mm Promus drug-eluting stent 2. Chronic occlusion of the mid circumflex with the first OM branch collateralized from the LAD and right coronary artery 3. Moderate stenosis of a large ramus intermedius branch 4. Diffuse nonobstructive stenosis of the LAD 5. Normal LV function by noninvasive assessment DAPT with ASA and plavix x 12 months. Could consider Twilight Protocol as well (pt would need to be transitioned to [facility])          Patient Profile     70 y.o. male with hx of CAD and known CTO of LCx by [facility] in 2003 who was recently seen by Dr. Virl Axe for an abnormal ECG.  Myoview was obtained and mod to high risk with predominantly fixed defect in the basal  to mid lateral and inferolateral walls and a small to moderate reversible defect in the mid to apical anterior and anteroseptal walls. Coronary CTA demonstrated Ca score 750 and diffuse mild to mod CAD with severe mixed plaque in proximal RCA with 70-99% stenosis.  Echo demonstrated EF 55-60%, Gr 1 DD, mild dilated ascending aorta at 41 mm and possible functional bicuspid AV.  He was seen by Dr. Sherren Mocha set up for [facility] yesterday which demonstrated CTO of LCx and high grade proximal RCA stenosis which was treated with DES.     Assessment & Plan    1. CAD - s/p DES to RCA yesterday.  He has known CTO of LCx.  Mod residual disease in RI to be tx medically.  This AM, he is doing well. Ok to DC home after seen by attending MD.  He was not interested in the [place]. Continue ASA, Plavix, statin.  He is interested in [facility] and will do it at [facility].  He prefers to FU with Dr. Caryl Comes.   2. HTN - BP has been uncontrolled.  His Losartan was held for his cath.  Will resume tomorrow.  If BP remains high, consider increasing Amlodipine to 10 mg QD at FU.  3. HL - Continue mod intensity statin with goal LDL < 70.    4. CKD - Creatinine stable post Cath/PCI.   Dispo - Attending MD to see.  Probable DC today.  Will plan FU with Dr. Caryl Comes or [facility] in [place].  Signed, Richardson Dopp, PA-C  05/05/2016, 10:03 AM    I have seen and examined the patient along with Richardson Dopp, PA-C .  I have reviewed the chart, notes and new data.  I agree with PA/NP's note.  Key new complaints: feels well, angina-free Key examination changes: small right wrist ecchymosis   PLAN: DC home with [place] follow up. Reviewed critical role of uninterrupted DAPT. On appropriate meds. Refer to cardiac rehab.  Sanda Klein, MD, [place] [phone] 05/05/2016, 10:47 AM

## 2016-05-07 ENCOUNTER — Other Ambulatory Visit: Payer: Self-pay | Admitting: *Deleted

## 2016-05-07 ENCOUNTER — Other Ambulatory Visit: Payer: Self-pay

## 2016-05-07 DIAGNOSIS — N189 Chronic kidney disease, unspecified: Secondary | ICD-10-CM

## 2016-05-07 MED ORDER — CLOPIDOGREL BISULFATE 75 MG PO TABS
75.0000 mg | ORAL_TABLET | Freq: Every evening | ORAL | 3 refills | Status: DC
Start: 2016-05-07 — End: 2017-02-18

## 2016-05-08 ENCOUNTER — Other Ambulatory Visit (INDEPENDENT_AMBULATORY_CARE_PROVIDER_SITE_OTHER): Payer: Medicare Other

## 2016-05-08 DIAGNOSIS — N189 Chronic kidney disease, unspecified: Secondary | ICD-10-CM

## 2016-05-09 LAB — BASIC METABOLIC PANEL
BUN / CREAT RATIO: 19 (ref 10–24)
BUN: 30 mg/dL — AB (ref 8–27)
CALCIUM: 9.5 mg/dL (ref 8.6–10.2)
CHLORIDE: 96 mmol/L (ref 96–106)
CO2: 20 mmol/L (ref 18–29)
Creatinine, Ser: 1.59 mg/dL — ABNORMAL HIGH (ref 0.76–1.27)
GFR calc non Af Amer: 43 mL/min/{1.73_m2} — ABNORMAL LOW (ref >59)
GFR, EST AFRICAN AMERICAN: 49 mL/min/{1.73_m2} — AB (ref >59)
GLUCOSE: 118 mg/dL — AB (ref 65–99)
POTASSIUM: 5.4 mmol/L — AB (ref 3.5–5.2)
Sodium: 135 mmol/L (ref 134–144)

## 2016-05-14 ENCOUNTER — Other Ambulatory Visit: Payer: Self-pay

## 2016-05-14 DIAGNOSIS — E875 Hyperkalemia: Secondary | ICD-10-CM

## 2016-05-15 ENCOUNTER — Other Ambulatory Visit
Admission: RE | Admit: 2016-05-15 | Discharge: 2016-05-15 | Disposition: A | Discharge status: Home / Self Care | Payer: Medicare Other | Source: Ambulatory Visit | Attending: Internal Medicine | Admitting: Internal Medicine

## 2016-05-15 DIAGNOSIS — E875 Hyperkalemia: Secondary | ICD-10-CM | POA: Insufficient documentation

## 2016-05-15 LAB — BASIC METABOLIC PANEL
Anion gap: 9 (ref 5–15)
BUN: 29 mg/dL — AB (ref 6–20)
CALCIUM: 9.4 mg/dL (ref 8.9–10.3)
CHLORIDE: 104 mmol/L (ref 101–111)
CO2: 23 mmol/L (ref 22–32)
CREATININE: 1.55 mg/dL — AB (ref 0.61–1.24)
GFR, EST AFRICAN AMERICAN: 50 mL/min — AB (ref >60)
GFR, EST NON AFRICAN AMERICAN: 43 mL/min — AB (ref >60)
Glucose, Bld: 128 mg/dL — ABNORMAL HIGH (ref 65–99)
Potassium: 4.7 mmol/L (ref 3.5–5.1)
SODIUM: 136 mmol/L (ref 135–145)

## 2016-05-16 NOTE — Progress Notes (Signed)
Patient Care Team: Adin Hector, MD as PCP - General (Internal Medicine)   HPI  Paul Choi is a 70 y.o. male Seen in followup for PVCs and bicuspid aortic valve  During the eval of the above, he underwent CTA with Ca score 750 with high grade RCA disease   Nuclear scan had EF 42% with predominantly fixed defect in the basal to mid lateral and inferolateral walls and a small to moderate reversible defect in the mid to apical anterior and anteroseptal walls  Given the absence of symptoms, he was referred for preeval with [facility] who after exhaustive evaluation,  LHC 9/17 >>   1. Critical stenosis of the proximal RCA treated successfully with coronary stenting using a 3.5 x 16 mm Promus drug-eluting stent 2. Chronic occlusion of the mid circumflex with the first OM branch collateralized from the LAD and right coronary artery 3. Moderate stenosis of a large ramus intermedius branch 4. Diffuse nonobstructive stenosis of the LAD 5. Normal LV function by noninvasive assessment  He is much better folloiwng intervention with clear improvement in exercise tolerance      Records and Results Reviewed  9/17/  K 5.4 05/13/16 K 4.7  Past Medical History:  Diagnosis Date  . Arteriosclerotic cardiovascular disease   . Arthritis 1997  . Coronary artery disease    Cardiac catheterization in August 2003 with occluded left circumflex. Echo 8/13 with mild segmental LV dysfunction, EF 40%, moderate mitral insufficiency and mild tricuspid insufficiency; aortic valve appears bicuspid  . Diabetes mellitus without complication ([place])    TYPE 2   . ED (erectile dysfunction)   . Gout   . Gout   . Heart disease   . Hyperlipidemia   . Hypertension 1982  . Insomnia   . Personal history of colonic polyps   . Renal insufficiency     Past Surgical History:  Procedure Laterality Date  . CARDIAC CATHETERIZATION  2006  . CARDIAC CATHETERIZATION N/A 05/04/2016   Procedure: Left Heart  Cath and Coronary Angiography;  Surgeon: Sherren Mocha, MD;  Location: [facility];  Service: Cardiovascular;  Laterality: N/A;  . CARDIAC CATHETERIZATION N/A 05/04/2016   Procedure: Coronary Stent Intervention;  Surgeon: Sherren Mocha, MD;  Location: [facility];  Service: Cardiovascular;  Laterality: N/A;  . COLONOSCOPY  2007  . CORONARY STENT PLACEMENT  05/04/2016   proximal RCA treated successfully with coronary stenting using a 3.5 x 16 mm Promus drug-eluting stent  . CORONARY STENT PLACEMENT  05/04/2016   Mid LAD lesion, 30 %stenosed.  Marland Kitchen HERNIA REPAIR    . KNEE SURGERY  1988    Current Outpatient Prescriptions  Medication Sig Dispense Refill  . allopurinol (ZYLOPRIM) 100 MG tablet Take 200 mg by mouth every evening.     Marland Kitchen amLODipine (NORVASC) 5 MG tablet Take 5 mg by mouth every evening.     Marland Kitchen aspirin EC 81 MG tablet Take 81 mg by mouth daily.    Marland Kitchen atorvastatin (LIPITOR) 40 MG tablet Take 1 tablet (40 mg total) by mouth every evening.    . cholecalciferol (VITAMIN D) 1000 units tablet Take 1,000 Units by mouth every evening.     . clopidogrel (PLAVIX) 75 MG tablet Take 1 tablet (75 mg total) by mouth every evening. 90 tablet 3  . colchicine 0.6 MG tablet Take 0.6 mg by mouth daily as needed (for gout flare ups.).     Marland Kitchen docusate sodium (COLACE)  100 MG capsule Take 200-300 mg by mouth daily as needed for mild constipation.    . Fish Oil-Cholecalciferol (FISH OIL + D3 PO) Take 1 capsule by mouth daily.     Marland Kitchen losartan (COZAAR) 100 MG tablet Take 1 tablet (100 mg total) by mouth every evening.    . polyethylene glycol (MIRALAX / GLYCOLAX) packet Take 17 g by mouth daily as needed for mild constipation.     No current facility-administered medications for this visit.     Allergies  Allergen Reactions  . Ace Inhibitors Cough  . Contrast Media [Iodinated Diagnostic Agents] Other (See Comments)    Cough and difficulty breathing   . Lovastatin Other (See Comments)     Myalgia at 80-mg dose  . Other Nausea Only, Hives and Cough    Contrast media  Had difficulty breathing  . Shellfish Allergy Hives  . Trazodone Other (See Comments)    "felt like zombie"      Review of Systems negative except from HPI and PMH  Physical Exam BP 130/72 (BP Location: Left Arm, Patient Position: Sitting, Cuff Size: Normal)   Pulse 77   Ht 5\' 11"  (1.803 m)   Wt 199 lb 4 oz (90.4 kg)   BMI 27.79 kg/m  Well developed and well nourished in no acute distress HENT normal E scleral and icterus clear Neck Supple JVP flat; carotids brisk and full Clear to ausculation  *Regular rate and rhythm, 2/6  Soft with active bowel sounds No clubbing cyanosis  Edema Alert and oriented, grossly normal motor and sensory function Skin Warm and Dry some resolving ecchymosis  Sinus rhythm at 78 Intervals 19/08/37  Assessment and  Plan  Palpitations-PVCs by examination  Coronary artery disease with an occluded circumflex, left-right collaterals 2003  High-grade RCA stenosis status post DES   Cardiomyopathy  Resolved  >>>>>>Mitral regurgitation-moderate  Bicuspid aortic valve/mild aortic root dilitation  Diabetes  Sleep disordered breathing  Hypertension  Patient is much improved following intervention.  Without symptoms of ischemia which for him were exercise intolerance   He will need annual surveillance of his aortic root and aortic valve.   We'll anticipate this by echocardiogram and I will confirm this with Dr. Billee Cashing or; medications for DAPT were for at least 12 months      Current medicines are reviewed at length with the patient today .  The patient does not  have concerns regarding medicines.

## 2016-05-17 ENCOUNTER — Encounter: Payer: Self-pay | Admitting: Internal Medicine

## 2016-05-17 ENCOUNTER — Ambulatory Visit (INDEPENDENT_AMBULATORY_CARE_PROVIDER_SITE_OTHER): Payer: Medicare Other | Admitting: Internal Medicine

## 2016-05-17 VITALS — BP 130/72 | HR 77 | Ht 71.0 in | Wt 199.2 lb

## 2016-05-17 DIAGNOSIS — I493 Ventricular premature depolarization: Secondary | ICD-10-CM | POA: Diagnosis not present

## 2016-05-17 DIAGNOSIS — I10 Essential (primary) hypertension: Secondary | ICD-10-CM

## 2016-05-17 DIAGNOSIS — Q231 Congenital insufficiency of aortic valve: Secondary | ICD-10-CM

## 2016-05-17 DIAGNOSIS — I340 Nonrheumatic mitral (valve) insufficiency: Secondary | ICD-10-CM | POA: Diagnosis not present

## 2016-05-17 DIAGNOSIS — I2510 Atherosclerotic heart disease of native coronary artery without angina pectoris: Secondary | ICD-10-CM | POA: Diagnosis not present

## 2016-05-17 DIAGNOSIS — I429 Cardiomyopathy, unspecified: Secondary | ICD-10-CM | POA: Diagnosis not present

## 2016-05-17 NOTE — Patient Instructions (Addendum)

## 2016-05-21 ENCOUNTER — Encounter: Payer: Medicare Other | Attending: Cardiovascular Disease | Admitting: *Deleted

## 2016-05-21 VITALS — Ht 72.4 in | Wt 193.7 lb

## 2016-05-21 DIAGNOSIS — Z48812 Encounter for surgical aftercare following surgery on the circulatory system: Secondary | ICD-10-CM | POA: Diagnosis not present

## 2016-05-21 DIAGNOSIS — Z9889 Other specified postprocedural states: Secondary | ICD-10-CM | POA: Diagnosis present

## 2016-05-21 DIAGNOSIS — Z955 Presence of coronary angioplasty implant and graft: Secondary | ICD-10-CM | POA: Insufficient documentation

## 2016-05-21 NOTE — Patient Instructions (Signed)
Patient Instructions  Patient Details  Name: Paul Choi MRN: [id number] Date of Birth: 05/15/1946 Referring Provider:  Sherren Mocha, MD  Below are the personal goals you chose as well as exercise and nutrition goals. Our goal is to help you keep on track towards obtaining and maintaining your goals. We will be discussing your progress on these goals with you throughout the program.  Initial Exercise Prescription:     Initial Exercise Prescription - 05/21/16 1500      Date of Initial Exercise RX and Referring Provider   Date 05/21/16   Referring Provider Virl Axe MD     Treadmill   MPH 2   Grade 1.5   Minutes 15   METs 2.94     NuStep   Level 3   Watts --  80-100 spm   Minutes 15   METs 2     REL-XR   Level 3   Watts --  speed 50 rpm   Minutes 15   METs 3     Prescription Details   Frequency (times per week) 2   Duration Progress to 45 minutes of aerobic exercise without signs/symptoms of physical distress     Intensity   THRR 40-80% of Max Heartrate 104-133   Ratings of Perceived Exertion 11-13   Perceived Dyspnea 0-4     Progression   Progression Continue to progress workloads to maintain intensity without signs/symptoms of physical distress.     Resistance Training   Training Prescription Yes   Weight 3 lbs   Reps 10-12      Exercise Goals: Frequency: Be able to perform aerobic exercise three times per week working toward 3-5 days per week.  Intensity: Work with a perceived exertion of 11 (fairly light) - 15 (hard) as tolerated. Follow your new exercise prescription and watch for changes in prescription as you progress with the program. Changes will be reviewed with you when they are made.  Duration: You should be able to do 30 minutes of continuous aerobic exercise in addition to a 5 minute warm-up and a 5 minute cool-down routine.  Nutrition Goals: Your personal nutrition goals will be established when you do your nutrition analysis  with the dietician.  The following are nutrition guidelines to follow: Cholesterol < 200mg /day Sodium < 1500mg /day Fiber: Men over 50 yrs - 30 grams per day  Personal Goals:     Personal Goals and Risk Factors at Admission - 05/21/16 1443      Core Components/Risk Factors/Patient Goals on Admission    Weight Management Yes;Weight Loss   Intervention Weight Management: Develop a combined nutrition and exercise program designed to reach desired caloric intake, while maintaining appropriate intake of nutrient and fiber, sodium and fats, and appropriate energy expenditure required for the weight goal.;Weight Management: Provide education and appropriate resources to help participant work on and attain dietary goals.   Admit Weight 193 lb 11.2 oz (87.9 kg)   Goal Weight: Short Term 190 lb (86.2 kg)   Goal Weight: Long Term 180 lb (81.6 kg)   Expected Outcomes Short Term: Continue to assess and modify interventions until short term weight is achieved;Long Term: Adherence to nutrition and physical activity/exercise program aimed toward attainment of established weight goal;Weight Loss: Understanding of general recommendations for a balanced deficit meal plan, which promotes 1-2 lb weight loss per week and includes a negative energy balance of [phone] kcal/d;Understanding recommendations for meals to include 15-35% energy as protein, 25-35% energy from fat, 35-60% energy from  carbohydrates, less than 200mg  of dietary cholesterol, 20-35 gm of total fiber daily;Understanding of distribution of calorie intake throughout the day with the consumption of 4-5 meals/snacks   Increase Strength and Stamina Yes   Intervention Provide advice, education, support and counseling about physical activity/exercise needs.;Develop an individualized exercise prescription for aerobic and resistive training based on initial evaluation findings, risk stratification, comorbidities and participant's personal goals.   Expected  Outcomes Achievement of increased cardiorespiratory fitness and enhanced flexibility, muscular endurance and strength shown through measurements of functional capacity and personal statement of participant.   Diabetes Yes  Diet and exercise controlled   Intervention Provide education about signs/symptoms and action to take for hypo/hyperglycemia.;Provide education about proper nutrition, including hydration, and aerobic/resistive exercise prescription along with prescribed medications to achieve blood glucose in normal ranges: Fasting glucose 65-99 mg/dL   Expected Outcomes Short Term: Participant verbalizes understanding of the signs/symptoms and immediate care of hyper/hypoglycemia, proper foot care and importance of medication, aerobic/resistive exercise and nutrition plan for blood glucose control.;Long Term: Attainment of HbA1C < 7%.   Hypertension Yes   Intervention Provide education on lifestyle modifcations including regular physical activity/exercise, weight management, moderate sodium restriction and increased consumption of fresh fruit, vegetables, and low fat dairy, alcohol moderation, and smoking cessation.;Monitor prescription use compliance.   Expected Outcomes Short Term: Continued assessment and intervention until BP is < 140/18mm HG in hypertensive participants. < 130/59mm HG in hypertensive participants with diabetes, heart failure or chronic kidney disease.;Long Term: Maintenance of blood pressure at goal levels.   Lipids Yes   Intervention Provide education and support for participant on nutrition & aerobic/resistive exercise along with prescribed medications to achieve LDL 70mg , HDL >40mg .   Expected Outcomes Short Term: Participant states understanding of desired cholesterol values and is compliant with medications prescribed. Participant is following exercise prescription and nutrition guidelines.;Long Term: Cholesterol controlled with medications as prescribed, with individualized  exercise RX and with personalized nutrition plan. Value goals: LDL < 70mg , HDL > 40 mg.   Personal Goal Other Yes   Personal Goal Better exercise routine that can be maintained long term   Intervention Provide exercise prescription in class and for at home   Expected Outcomes Able to be independent with exercise and establish routine      Tobacco Use Initial Evaluation: History  Smoking Status  . Former Smoker  . Packs/day: 1.00  . Years: 25.00  . Quit date: 09/22/1995  Smokeless Tobacco  . Never Used    Copy of goals given to participant.

## 2016-05-21 NOTE — Progress Notes (Signed)
Cardiac Individual Treatment Plan  Patient Details  Name: Paul Choi MRN: [id number] Date of Birth: Sep 30, 1945 Referring Provider:   Flowsheet Row Cardiac Rehab from 05/21/2016 in [facility] Cardiac and Pulmonary Rehab  Referring Provider  Virl Axe MD      Initial Encounter Date:  Flowsheet Row Cardiac Rehab from 05/21/2016 in [facility] Cardiac and Pulmonary Rehab  Date  05/21/16  Referring Provider  Virl Axe MD      Visit Diagnosis: Status post coronary artery stent placement  Patient's Home Medications on Admission:  Current Outpatient Prescriptions:  .  allopurinol (ZYLOPRIM) 100 MG tablet, Take 200 mg by mouth every evening. , Disp: , Rfl:  .  amLODipine (NORVASC) 5 MG tablet, Take 5 mg by mouth every evening. , Disp: , Rfl:  .  aspirin EC 81 MG tablet, Take 81 mg by mouth daily., Disp: , Rfl:  .  atorvastatin (LIPITOR) 40 MG tablet, Take 1 tablet (40 mg total) by mouth every evening., Disp: , Rfl:  .  cholecalciferol (VITAMIN D) 1000 units tablet, Take 1,000 Units by mouth every evening. , Disp: , Rfl:  .  clopidogrel (PLAVIX) 75 MG tablet, Take 1 tablet (75 mg total) by mouth every evening., Disp: 90 tablet, Rfl: 3 .  colchicine 0.6 MG tablet, Take 0.6 mg by mouth daily as needed (for gout flare ups.). , Disp: , Rfl:  .  docusate sodium (COLACE) 100 MG capsule, Take 200-300 mg by mouth daily as needed for mild constipation., Disp: , Rfl:  .  Fish Oil-Cholecalciferol (FISH OIL + D3 PO), Take 1 capsule by mouth daily. , Disp: , Rfl:  .  losartan (COZAAR) 100 MG tablet, Take 1 tablet (100 mg total) by mouth every evening., Disp: , Rfl:  .  polyethylene glycol (MIRALAX / GLYCOLAX) packet, Take 17 g by mouth daily as needed for mild constipation., Disp: , Rfl:   Past Medical History: Past Medical History:  Diagnosis Date  . Arteriosclerotic cardiovascular disease   . Arthritis 1997  . Coronary artery disease    Cardiac catheterization in August 2003 with occluded left  circumflex. Echo 8/13 with mild segmental LV dysfunction, EF 40%, moderate mitral insufficiency and mild tricuspid insufficiency; aortic valve appears bicuspid  . Diabetes mellitus without complication ([place])    TYPE 2   . ED (erectile dysfunction)   . Gout   . Gout   . Heart disease   . Hyperlipidemia   . Hypertension 1982  . Insomnia   . Personal history of colonic polyps   . Renal insufficiency     Tobacco Use: History  Smoking Status  . Former Smoker  . Packs/day: 1.00  . Years: 25.00  . Quit date: 09/22/1995  Smokeless Tobacco  . Never Used    Labs: Recent Review Flowsheet Data    There is no flowsheet data to display.       Exercise Target Goals: Date: 05/21/16  Exercise Program Goal: Individual exercise prescription set with THRR, safety & activity barriers. Participant demonstrates ability to understand and report RPE using BORG scale, to self-measure pulse accurately, and to acknowledge the importance of the exercise prescription.  Exercise Prescription Goal: Starting with aerobic activity 30 plus minutes a day, 3 days per week for initial exercise prescription. Provide home exercise prescription and guidelines that participant acknowledges understanding prior to discharge.  Activity Barriers & Risk Stratification:     Activity Barriers & Cardiac Risk Stratification - 05/21/16 1440      Activity Barriers &  Cardiac Risk Stratification   Activity Barriers Other (comment);Balance Concerns  Gout, no flare in quite awhile, previous knee surgery   Cardiac Risk Stratification High      6 Minute Walk:     6 Minute Walk    Row Name 05/21/16 1524         6 Minute Walk   Phase Initial     Distance 1245 feet     Walk Time 6 minutes     # of Rest Breaks 0     MPH 2.36     METS 3     RPE 9     VO2 Peak 10.53     Symptoms No     Resting HR 74 bpm     Resting BP 146/74     Max Ex. HR 114 bpm     Max Ex. BP 130/60     2 Minute Post BP 126/70         Initial Exercise Prescription:     Initial Exercise Prescription - 05/21/16 1500      Date of Initial Exercise RX and Referring Provider   Date 05/21/16   Referring Provider Virl Axe MD     Treadmill   MPH 2   Grade 1.5   Minutes 15   METs 2.94     NuStep   Level 3   Watts --  80-100 spm   Minutes 15   METs 2     REL-XR   Level 3   Watts --  speed 50 rpm   Minutes 15   METs 3     Prescription Details   Frequency (times per week) 2   Duration Progress to 45 minutes of aerobic exercise without signs/symptoms of physical distress     Intensity   THRR 40-80% of Max Heartrate 104-133   Ratings of Perceived Exertion 11-13   Perceived Dyspnea 0-4     Progression   Progression Continue to progress workloads to maintain intensity without signs/symptoms of physical distress.     Resistance Training   Training Prescription Yes   Weight 3 lbs   Reps 10-12      Perform Capillary Blood Glucose checks as needed.  Exercise Prescription Changes:      Exercise Prescription Changes    Row Name 05/21/16 1500             Exercise Review   Progression -  walk test results         Response to Exercise   Blood Pressure (Admit) 146/74       Blood Pressure (Exercise) 130/60       Blood Pressure (Exit) 126/70       Heart Rate (Admit) 74 bpm       Heart Rate (Exercise) 114 bpm       Heart Rate (Exit) 75 bpm       Oxygen Saturation (Admit) 100 %       Oxygen Saturation (Exit) 100 %       Rating of Perceived Exertion (Exercise) 9       Symptoms none          Exercise Comments:      Exercise Comments    Row Name 05/21/16 1527           Exercise Comments Exercise goals are to lose weight and get into a better exercise routine that he can maintain long term.          Discharge Exercise Prescription (  Final Exercise Prescription Changes):     Exercise Prescription Changes - 05/21/16 1500      Exercise Review   Progression --  walk test  results     Response to Exercise   Blood Pressure (Admit) 146/74   Blood Pressure (Exercise) 130/60   Blood Pressure (Exit) 126/70   Heart Rate (Admit) 74 bpm   Heart Rate (Exercise) 114 bpm   Heart Rate (Exit) 75 bpm   Oxygen Saturation (Admit) 100 %   Oxygen Saturation (Exit) 100 %   Rating of Perceived Exertion (Exercise) 9   Symptoms none      Nutrition:  Target Goals: Understanding of nutrition guidelines, daily intake of sodium 1500mg , cholesterol 200mg , calories 30% from fat and 7% or less from saturated fats, daily to have 5 or more servings of fruits and vegetables.  Biometrics:     Pre Biometrics - 05/21/16 1530      Pre Biometrics   Height 6' 0.4" (1.839 m)   Weight 193 lb 11.2 oz (87.9 kg)   Waist Circumference 39 inches   Hip Circumference 35 inches   Waist to Hip Ratio 1.11 %   BMI (Calculated) 26   Single Leg Stand 1.38 seconds       Nutrition Therapy Plan and Nutrition Goals:     Nutrition Therapy & Goals - 05/21/16 1446      Intervention Plan   Intervention Prescribe, educate and counsel regarding individualized specific dietary modifications aiming towards targeted core components such as weight, hypertension, lipid management, diabetes, heart failure and other comorbidities.      Nutrition Discharge: Rate Your Plate Scores:     Nutrition Assessments - 05/21/16 1441      Rate Your Plate Scores   Pre Score 48   Pre Score % 55 %      Nutrition Goals Re-Evaluation:   Psychosocial: Target Goals: Acknowledge presence or absence of depression, maximize coping skills, provide positive support system. Participant is able to verbalize types and ability to use techniques and skills needed for reducing stress and depression.  Initial Review & Psychosocial Screening:     Initial Psych Review & Screening - 05/21/16 1452      Initial Review   Current issues with Current Sleep Concerns  Will use OTC to help with sleep occasionally       Quality of Life Scores:     Quality of Life - 05/21/16 1438      Quality of Life Scores   Health/Function Pre 21 %   Socioeconomic Pre 20.29 %   Psych/Spiritual Pre 20.86 %   Family Pre 21 %   GLOBAL Pre 20.82 %      PHQ-9: Recent Review Flowsheet Data    Depression screen [facility] 2/9 05/21/2016   Decreased Interest 0   Down, Depressed, Hopeless 0   PHQ - 2 Score 0   Altered sleeping 0   Tired, decreased energy 0   Change in appetite 2   Feeling bad or failure about yourself  0   Trouble concentrating 0   Moving slowly or fidgety/restless 0   Suicidal thoughts 0   PHQ-9 Score 2   Difficult doing work/chores Not difficult at all      Psychosocial Evaluation and Intervention:   Psychosocial Re-Evaluation:   Vocational Rehabilitation: Provide vocational rehab assistance to qualifying candidates.   Vocational Rehab Evaluation & Intervention:     Vocational Rehab - 05/21/16 1441      Initial Vocational Rehab Evaluation &  Intervention   Assessment shows need for Vocational Rehabilitation No      Education: Education Goals: Education classes will be provided on a weekly basis, covering required topics. Participant will state understanding/return demonstration of topics presented.  Learning Barriers/Preferences:     Learning Barriers/Preferences - 05/21/16 1440      Learning Barriers/Preferences   Learning Barriers None   Learning Preferences None      Education Topics: General Nutrition Guidelines/Fats and Fiber: -Group instruction provided by verbal, written material, models and posters to present the general guidelines for heart healthy nutrition. Gives an explanation and review of dietary fats and fiber.   Controlling Sodium/Reading Food Labels: -Group verbal and written material supporting the discussion of sodium use in heart healthy nutrition. Review and explanation with models, verbal and written materials for utilization of the food  label.   Exercise Physiology & Risk Factors: - Group verbal and written instruction with models to review the exercise physiology of the cardiovascular system and associated critical values. Details cardiovascular disease risk factors and the goals associated with each risk factor.   Aerobic Exercise & Resistance Training: - Gives group verbal and written discussion on the health impact of inactivity. On the components of aerobic and resistive training programs and the benefits of this training and how to safely progress through these programs.   Flexibility, Balance, General Exercise Guidelines: - Provides group verbal and written instruction on the benefits of flexibility and balance training programs. Provides general exercise guidelines with specific guidelines to those with heart or lung disease. Demonstration and skill practice provided.   Stress Management: - Provides group verbal and written instruction about the health risks of elevated stress, cause of high stress, and healthy ways to reduce stress.   Depression: - Provides group verbal and written instruction on the correlation between heart/lung disease and depressed mood, treatment options, and the stigmas associated with seeking treatment.   Anatomy & Physiology of the Heart: - Group verbal and written instruction and models provide basic cardiac anatomy and physiology, with the coronary electrical and arterial systems. Review of: AMI, Angina, Valve disease, Heart Failure, Cardiac Arrhythmia, Pacemakers, and the ICD.   Cardiac Procedures: - Group verbal and written instruction and models to describe the testing methods done to diagnose heart disease. Reviews the outcomes of the test results. Describes the treatment choices: Medical Management, Angioplasty, or Coronary Bypass Surgery.   Cardiac Medications: - Group verbal and written instruction to review commonly prescribed medications for heart disease. Reviews the  medication, class of the drug, and side effects. Includes the steps to properly store meds and maintain the prescription regimen.   Go Sex-Intimacy & Heart Disease, Get SMART - Goal Setting: - Group verbal and written instruction through game format to discuss heart disease and the return to sexual intimacy. Provides group verbal and written material to discuss and apply goal setting through the application of the S.M.A.R.T. Method.   Other Matters of the Heart: - Provides group verbal, written materials and models to describe Heart Failure, Angina, Valve Disease, and Diabetes in the realm of heart disease. Includes description of the disease process and treatment options available to the cardiac patient.   Exercise & Equipment Safety: - Individual verbal instruction and demonstration of equipment use and safety with use of the equipment. Flowsheet Row Cardiac Rehab from 05/21/2016 in [facility] Cardiac and Pulmonary Rehab  Date  05/21/16  Educator  SB  Instruction Review Code  2- meets goals/outcomes  Infection Prevention: - Provides verbal and written material to individual with discussion of infection control including proper hand washing and proper equipment cleaning during exercise session. Flowsheet Row Cardiac Rehab from 05/21/2016 in [facility] Cardiac and Pulmonary Rehab  Date  05/21/16  Educator  SB  Instruction Review Code  2- meets goals/outcomes      Falls Prevention: - Provides verbal and written material to individual with discussion of falls prevention and safety.   Diabetes: - Individual verbal and written instruction to review signs/symptoms of diabetes, desired ranges of glucose level fasting, after meals and with exercise. Advice that pre and post exercise glucose checks will be done for 3 sessions at entry of program. Flowsheet Row Cardiac Rehab from 05/21/2016 in [facility] Cardiac and Pulmonary Rehab  Date  05/21/16  Educator  SB  Instruction Review Code  2- meets  goals/outcomes       Knowledge Questionnaire Score:     Knowledge Questionnaire Score - 05/21/16 1440      Knowledge Questionnaire Score   Pre Score 22/28      Core Components/Risk Factors/Patient Goals at Admission:     Personal Goals and Risk Factors at Admission - 05/21/16 1443      Core Components/Risk Factors/Patient Goals on Admission    Weight Management Yes;Weight Loss   Intervention Weight Management: Develop a combined nutrition and exercise program designed to reach desired caloric intake, while maintaining appropriate intake of nutrient and fiber, sodium and fats, and appropriate energy expenditure required for the weight goal.;Weight Management: Provide education and appropriate resources to help participant work on and attain dietary goals.   Admit Weight 193 lb 11.2 oz (87.9 kg)   Goal Weight: Short Term 190 lb (86.2 kg)   Goal Weight: Long Term 180 lb (81.6 kg)   Expected Outcomes Short Term: Continue to assess and modify interventions until short term weight is achieved;Long Term: Adherence to nutrition and physical activity/exercise program aimed toward attainment of established weight goal;Weight Loss: Understanding of general recommendations for a balanced deficit meal plan, which promotes 1-2 lb weight loss per week and includes a negative energy balance of [phone] kcal/d;Understanding recommendations for meals to include 15-35% energy as protein, 25-35% energy from fat, 35-60% energy from carbohydrates, less than 200mg  of dietary cholesterol, 20-35 gm of total fiber daily;Understanding of distribution of calorie intake throughout the day with the consumption of 4-5 meals/snacks   Increase Strength and Stamina Yes   Intervention Provide advice, education, support and counseling about physical activity/exercise needs.;Develop an individualized exercise prescription for aerobic and resistive training based on initial evaluation findings, risk stratification,  comorbidities and participant's personal goals.   Expected Outcomes Achievement of increased cardiorespiratory fitness and enhanced flexibility, muscular endurance and strength shown through measurements of functional capacity and personal statement of participant.   Diabetes Yes  Diet and exercise controlled   Intervention Provide education about signs/symptoms and action to take for hypo/hyperglycemia.;Provide education about proper nutrition, including hydration, and aerobic/resistive exercise prescription along with prescribed medications to achieve blood glucose in normal ranges: Fasting glucose 65-99 mg/dL   Expected Outcomes Short Term: Participant verbalizes understanding of the signs/symptoms and immediate care of hyper/hypoglycemia, proper foot care and importance of medication, aerobic/resistive exercise and nutrition plan for blood glucose control.;Long Term: Attainment of HbA1C < 7%.   Hypertension Yes   Intervention Provide education on lifestyle modifcations including regular physical activity/exercise, weight management, moderate sodium restriction and increased consumption of fresh fruit, vegetables, and low fat dairy, alcohol moderation, and  smoking cessation.;Monitor prescription use compliance.   Expected Outcomes Short Term: Continued assessment and intervention until BP is < 140/68mm HG in hypertensive participants. < 130/24mm HG in hypertensive participants with diabetes, heart failure or chronic kidney disease.;Long Term: Maintenance of blood pressure at goal levels.   Lipids Yes   Intervention Provide education and support for participant on nutrition & aerobic/resistive exercise along with prescribed medications to achieve LDL 70mg , HDL >40mg .   Expected Outcomes Short Term: Participant states understanding of desired cholesterol values and is compliant with medications prescribed. Participant is following exercise prescription and nutrition guidelines.;Long Term: Cholesterol  controlled with medications as prescribed, with individualized exercise RX and with personalized nutrition plan. Value goals: LDL < 70mg , HDL > 40 mg.   Personal Goal Other Yes   Personal Goal Better exercise routine that can be maintained long term   Intervention Provide exercise prescription in class and for at home   Expected Outcomes Able to be independent with exercise and establish routine      Core Components/Risk Factors/Patient Goals Review:    Core Components/Risk Factors/Patient Goals at Discharge (Final Review):    ITP Comments:     ITP Comments    Row Name 05/21/16 1447           ITP Comments Initial ITP created during medical review. Diagnosis documentation found [facility]NCOUN[facility] in [facility] 04/26/2016          Comments: Initial ITP

## 2016-05-24 DIAGNOSIS — Z48812 Encounter for surgical aftercare following surgery on the circulatory system: Secondary | ICD-10-CM | POA: Diagnosis not present

## 2016-05-24 DIAGNOSIS — Z955 Presence of coronary angioplasty implant and graft: Secondary | ICD-10-CM

## 2016-05-24 LAB — GLUCOSE, CAPILLARY
GLUCOSE-CAPILLARY: 150 mg/dL — AB (ref 65–99)
GLUCOSE-CAPILLARY: 110 mg/dL — AB (ref 65–99)

## 2016-05-24 NOTE — Progress Notes (Signed)
Daily Session Note  Patient Details  Name: Paul Choi MRN: [id number] Date of Birth: 15-Nov-1945 Referring Provider:   Flowsheet Row Cardiac Rehab from 05/21/2016 in [facility] Cardiac and Pulmonary Rehab  Referring Provider  Virl Axe MD      Encounter Date: 05/24/2016  Check In:     Session Check In - 05/24/16 1013      Check-In   Location [facility]-Cardiac & Pulmonary Rehab   Staff Present Alberteen Sam, MA, ACSM RCEP, Exercise Physiologist;Shivon Hackel Oletta Darter, BA, ACSM CEP, Exercise Physiologist;Carroll Enterkin, RN, BSN   Supervising physician immediately available to respond to emergencies See telemetry face sheet for immediately available ER MD   Medication changes reported     No   Fall or balance concerns reported    No   Warm-up and Cool-down Performed on first and last piece of equipment   Resistance Training Performed Yes   VAD Patient? No     Pain Assessment   Currently in Pain? No/denies   Multiple Pain Sites No         Goals Met:  Independence with exercise equipment Exercise tolerated well No report of cardiac concerns or symptoms Strength training completed today  Goals Unmet:  Not Applicable  Comments: First full day of exercise!  Patient was oriented to gym and equipment including functions, settings, policies, and procedures.  Patient's individual exercise prescription and treatment plan were reviewed.  All starting workloads were established based on the results of the 6 minute walk test done at initial orientation visit.  The plan for exercise progression was also introduced and progression will be customized based on patient's performance and goals.    Dr. Emily Filbert is Medical Director for [facility].

## 2016-05-29 ENCOUNTER — Encounter: Payer: Medicare Other | Admitting: *Deleted

## 2016-05-29 DIAGNOSIS — Z48812 Encounter for surgical aftercare following surgery on the circulatory system: Secondary | ICD-10-CM | POA: Diagnosis not present

## 2016-05-29 DIAGNOSIS — Z955 Presence of coronary angioplasty implant and graft: Secondary | ICD-10-CM

## 2016-05-29 LAB — GLUCOSE, CAPILLARY
GLUCOSE-CAPILLARY: 145 mg/dL — AB (ref 65–99)
Glucose-Capillary: 111 mg/dL — ABNORMAL HIGH (ref 65–99)

## 2016-05-29 NOTE — Progress Notes (Signed)
Daily Session Note  Patient Details  Name: Paul Choi MRN: [id number] Date of Birth: [birth year] Referring Provider:   Flowsheet Row Cardiac Rehab from 05/21/2016 in [facility] Cardiac and Pulmonary Rehab  Referring Provider  Virl Axe MD      Encounter Date: 05/29/2016  Check In:     Session Check In - 05/29/16 0830      Check-In   Medication changes reported     No   Comments wrong chart no new med  SB         Goals Met:  Exercise tolerated well No report of cardiac concerns or symptoms Strength training completed today  Goals Unmet:  Not Applicable  Comments: Doing well with exercise prescription progression.    Dr. Emily Filbert is Medical Director for [facility].

## 2016-05-30 ENCOUNTER — Encounter: Payer: Self-pay | Admitting: *Deleted

## 2016-05-30 DIAGNOSIS — Z955 Presence of coronary angioplasty implant and graft: Secondary | ICD-10-CM

## 2016-05-30 NOTE — Progress Notes (Signed)
Cardiac Individual Treatment Plan  Patient Details  Name: Paul Choi MRN: [id number] Date of Birth: [birth year] Referring Provider:   Flowsheet Row Cardiac Rehab from 05/21/2016 in [facility] Cardiac and Pulmonary Rehab  Referring Provider  Virl Axe MD      Initial Encounter Date:  Flowsheet Row Cardiac Rehab from 05/21/2016 in [facility] Cardiac and Pulmonary Rehab  Date  05/21/16  Referring Provider  Virl Axe MD      Visit Diagnosis: Status post coronary artery stent placement  Patient's Home Medications on Admission:  Current Outpatient Prescriptions:  .  allopurinol (ZYLOPRIM) 100 MG tablet, Take 200 mg by mouth every evening. , Disp: , Rfl:  .  amLODipine (NORVASC) 5 MG tablet, Take 5 mg by mouth every evening. , Disp: , Rfl:  .  aspirin EC 81 MG tablet, Take 81 mg by mouth daily., Disp: , Rfl:  .  atorvastatin (LIPITOR) 40 MG tablet, Take 1 tablet (40 mg total) by mouth every evening., Disp: , Rfl:  .  cholecalciferol (VITAMIN D) 1000 units tablet, Take 1,000 Units by mouth every evening. , Disp: , Rfl:  .  clopidogrel (PLAVIX) 75 MG tablet, Take 1 tablet (75 mg total) by mouth every evening., Disp: 90 tablet, Rfl: 3 .  colchicine 0.6 MG tablet, Take 0.6 mg by mouth daily as needed (for gout flare ups.). , Disp: , Rfl:  .  docusate sodium (COLACE) 100 MG capsule, Take 200-300 mg by mouth daily as needed for mild constipation., Disp: , Rfl:  .  Fish Oil-Cholecalciferol (FISH OIL + D3 PO), Take 1 capsule by mouth daily. , Disp: , Rfl:  .  losartan (COZAAR) 100 MG tablet, Take 1 tablet (100 mg total) by mouth every evening., Disp: , Rfl:  .  polyethylene glycol (MIRALAX / GLYCOLAX) packet, Take 17 g by mouth daily as needed for mild constipation., Disp: , Rfl:   Past Medical History: Past Medical History:  Diagnosis Date  . Arteriosclerotic cardiovascular disease   . Arthritis 1997  . Coronary artery disease    Cardiac catheterization in August 2003 with occluded left  circumflex. Echo 8/13 with mild segmental LV dysfunction, EF 40%, moderate mitral insufficiency and mild tricuspid insufficiency; aortic valve appears bicuspid  . Diabetes mellitus without complication ([place])    TYPE 2   . ED (erectile dysfunction)   . Gout   . Gout   . Heart disease   . Hyperlipidemia   . Hypertension 1982  . Insomnia   . Personal history of colonic polyps   . Renal insufficiency     Tobacco Use: History  Smoking Status  . Former Smoker  . Packs/day: 1.00  . Years: 25.00  . Quit date: 09/22/1995  Smokeless Tobacco  . Never Used    Labs: Recent Review Flowsheet Data    There is no flowsheet data to display.       Exercise Target Goals:    Exercise Program Goal: Individual exercise prescription set with THRR, safety & activity barriers. Participant demonstrates ability to understand and report RPE using BORG scale, to self-measure pulse accurately, and to acknowledge the importance of the exercise prescription.  Exercise Prescription Goal: Starting with aerobic activity 30 plus minutes a day, 3 days per week for initial exercise prescription. Provide home exercise prescription and guidelines that participant acknowledges understanding prior to discharge.  Activity Barriers & Risk Stratification:     Activity Barriers & Cardiac Risk Stratification - 05/21/16 1440      Activity Barriers &  Cardiac Risk Stratification   Activity Barriers Other (comment);Balance Concerns  Gout, no flare in quite awhile, previous knee surgery   Cardiac Risk Stratification High      6 Minute Walk:     6 Minute Walk    Row Name 05/21/16 1524         6 Minute Walk   Phase Initial     Distance 1245 feet     Walk Time 6 minutes     # of Rest Breaks 0     MPH 2.36     METS 3     RPE 9     VO2 Peak 10.53     Symptoms No     Resting HR 74 bpm     Resting BP 146/74     Max Ex. HR 114 bpm     Max Ex. BP 130/60     2 Minute Post BP 126/70        Initial  Exercise Prescription:     Initial Exercise Prescription - 05/21/16 1500      Date of Initial Exercise RX and Referring Provider   Date 05/21/16   Referring Provider Virl Axe MD     Treadmill   MPH 2   Grade 1.5   Minutes 15   METs 2.94     NuStep   Level 3   Watts --  80-100 spm   Minutes 15   METs 2     REL-XR   Level 3   Watts --  speed 50 rpm   Minutes 15   METs 3     Prescription Details   Frequency (times per week) 2   Duration Progress to 45 minutes of aerobic exercise without signs/symptoms of physical distress     Intensity   THRR 40-80% of Max Heartrate 104-133   Ratings of Perceived Exertion 11-13   Perceived Dyspnea 0-4     Progression   Progression Continue to progress workloads to maintain intensity without signs/symptoms of physical distress.     Resistance Training   Training Prescription Yes   Weight 3 lbs   Reps 10-12      Perform Capillary Blood Glucose checks as needed.  Exercise Prescription Changes:     Exercise Prescription Changes    Row Name 05/21/16 1500             Exercise Review   Progression -  walk test results         Response to Exercise   Blood Pressure (Admit) 146/74       Blood Pressure (Exercise) 130/60       Blood Pressure (Exit) 126/70       Heart Rate (Admit) 74 bpm       Heart Rate (Exercise) 114 bpm       Heart Rate (Exit) 75 bpm       Oxygen Saturation (Admit) 100 %       Oxygen Saturation (Exit) 100 %       Rating of Perceived Exertion (Exercise) 9       Symptoms none          Exercise Comments:     Exercise Comments    Row Name 05/21/16 1527           Exercise Comments Exercise goals are to lose weight and get into a better exercise routine that he can maintain long term.          Discharge Exercise Prescription (Final Exercise  Prescription Changes):     Exercise Prescription Changes - 05/21/16 1500      Exercise Review   Progression --  walk test results     Response  to Exercise   Blood Pressure (Admit) 146/74   Blood Pressure (Exercise) 130/60   Blood Pressure (Exit) 126/70   Heart Rate (Admit) 74 bpm   Heart Rate (Exercise) 114 bpm   Heart Rate (Exit) 75 bpm   Oxygen Saturation (Admit) 100 %   Oxygen Saturation (Exit) 100 %   Rating of Perceived Exertion (Exercise) 9   Symptoms none      Nutrition:  Target Goals: Understanding of nutrition guidelines, daily intake of sodium 1500mg , cholesterol 200mg , calories 30% from fat and 7% or less from saturated fats, daily to have 5 or more servings of fruits and vegetables.  Biometrics:     Pre Biometrics - 05/21/16 1530      Pre Biometrics   Height 6' 0.4" (1.839 m)   Weight 193 lb 11.2 oz (87.9 kg)   Waist Circumference 39 inches   Hip Circumference 35 inches   Waist to Hip Ratio 1.11 %   BMI (Calculated) 26   Single Leg Stand 1.38 seconds       Nutrition Therapy Plan and Nutrition Goals:     Nutrition Therapy & Goals - 05/21/16 1446      Intervention Plan   Intervention Prescribe, educate and counsel regarding individualized specific dietary modifications aiming towards targeted core components such as weight, hypertension, lipid management, diabetes, heart failure and other comorbidities.      Nutrition Discharge: Rate Your Plate Scores:     Nutrition Assessments - 05/21/16 1441      Rate Your Plate Scores   Pre Score 48   Pre Score % 55 %      Nutrition Goals Re-Evaluation:   Psychosocial: Target Goals: Acknowledge presence or absence of depression, maximize coping skills, provide positive support system. Participant is able to verbalize types and ability to use techniques and skills needed for reducing stress and depression.  Initial Review & Psychosocial Screening:     Initial Psych Review & Screening - 05/21/16 1452      Initial Review   Current issues with Current Sleep Concerns  Will use OTC to help with sleep occasionally      Quality of Life Scores:      Quality of Life - 05/21/16 1438      Quality of Life Scores   Health/Function Pre 21 %   Socioeconomic Pre 20.29 %   Psych/Spiritual Pre 20.86 %   Family Pre 21 %   GLOBAL Pre 20.82 %      PHQ-9: Recent Review Flowsheet Data    Depression screen [facility] 2/9 05/21/2016   Decreased Interest 0   Down, Depressed, Hopeless 0   PHQ - 2 Score 0   Altered sleeping 0   Tired, decreased energy 0   Change in appetite 2   Feeling bad or failure about yourself  0   Trouble concentrating 0   Moving slowly or fidgety/restless 0   Suicidal thoughts 0   PHQ-9 Score 2   Difficult doing work/chores Not difficult at all      Psychosocial Evaluation and Intervention:   Psychosocial Re-Evaluation:   Vocational Rehabilitation: Provide vocational rehab assistance to qualifying candidates.   Vocational Rehab Evaluation & Intervention:     Vocational Rehab - 05/21/16 1441      Initial Vocational Rehab Evaluation & Intervention  Assessment shows need for Vocational Rehabilitation No      Education: Education Goals: Education classes will be provided on a weekly basis, covering required topics. Participant will state understanding/return demonstration of topics presented.  Learning Barriers/Preferences:     Learning Barriers/Preferences - 05/21/16 1440      Learning Barriers/Preferences   Learning Barriers None   Learning Preferences None      Education Topics: General Nutrition Guidelines/Fats and Fiber: -Group instruction provided by verbal, written material, models and posters to present the general guidelines for heart healthy nutrition. Gives an explanation and review of dietary fats and fiber.   Controlling Sodium/Reading Food Labels: -Group verbal and written material supporting the discussion of sodium use in heart healthy nutrition. Review and explanation with models, verbal and written materials for utilization of the food label.   Exercise Physiology & Risk  Factors: - Group verbal and written instruction with models to review the exercise physiology of the cardiovascular system and associated critical values. Details cardiovascular disease risk factors and the goals associated with each risk factor.   Aerobic Exercise & Resistance Training: - Gives group verbal and written discussion on the health impact of inactivity. On the components of aerobic and resistive training programs and the benefits of this training and how to safely progress through these programs. Flowsheet Row Cardiac Rehab from 05/29/2016 in [facility] Cardiac and Pulmonary Rehab  Date  05/24/16  Educator  [facility]  Instruction Review Code  2- meets goals/outcomes      Flexibility, Balance, General Exercise Guidelines: - Provides group verbal and written instruction on the benefits of flexibility and balance training programs. Provides general exercise guidelines with specific guidelines to those with heart or lung disease. Demonstration and skill practice provided. Flowsheet Row Cardiac Rehab from 05/29/2016 in [facility] Cardiac and Pulmonary Rehab  Date  05/29/16  Educator  [facility]  Instruction Review Code  2- meets goals/outcomes      Stress Management: - Provides group verbal and written instruction about the health risks of elevated stress, cause of high stress, and healthy ways to reduce stress.   Depression: - Provides group verbal and written instruction on the correlation between heart/lung disease and depressed mood, treatment options, and the stigmas associated with seeking treatment.   Anatomy & Physiology of the Heart: - Group verbal and written instruction and models provide basic cardiac anatomy and physiology, with the coronary electrical and arterial systems. Review of: AMI, Angina, Valve disease, Heart Failure, Cardiac Arrhythmia, Pacemakers, and the ICD.   Cardiac Procedures: - Group verbal and written instruction and models to describe the testing methods done to  diagnose heart disease. Reviews the outcomes of the test results. Describes the treatment choices: Medical Management, Angioplasty, or Coronary Bypass Surgery.   Cardiac Medications: - Group verbal and written instruction to review commonly prescribed medications for heart disease. Reviews the medication, class of the drug, and side effects. Includes the steps to properly store meds and maintain the prescription regimen.   Go Sex-Intimacy & Heart Disease, Get SMART - Goal Setting: - Group verbal and written instruction through game format to discuss heart disease and the return to sexual intimacy. Provides group verbal and written material to discuss and apply goal setting through the application of the S.M.A.R.T. Method.   Other Matters of the Heart: - Provides group verbal, written materials and models to describe Heart Failure, Angina, Valve Disease, and Diabetes in the realm of heart disease. Includes description of the disease process and treatment options available  to the cardiac patient.   Exercise & Equipment Safety: - Individual verbal instruction and demonstration of equipment use and safety with use of the equipment. Flowsheet Row Cardiac Rehab from 05/29/2016 in [facility] Cardiac and Pulmonary Rehab  Date  05/21/16  Educator  [facility]  Instruction Review Code  2- meets goals/outcomes      Infection Prevention: - Provides verbal and written material to individual with discussion of infection control including proper hand washing and proper equipment cleaning during exercise session. Flowsheet Row Cardiac Rehab from 05/29/2016 in [facility] Cardiac and Pulmonary Rehab  Date  05/21/16  Educator  [facility]  Instruction Review Code  2- meets goals/outcomes      Falls Prevention: - Provides verbal and written material to individual with discussion of falls prevention and safety.   Diabetes: - Individual verbal and written instruction to review signs/symptoms of diabetes, desired ranges of glucose  level fasting, after meals and with exercise. Advice that pre and post exercise glucose checks will be done for 3 sessions at entry of program. Flowsheet Row Cardiac Rehab from 05/29/2016 in [facility] Cardiac and Pulmonary Rehab  Date  05/21/16  Educator  SB  Instruction Review Code  2- meets goals/outcomes       Knowledge Questionnaire Score:     Knowledge Questionnaire Score - 05/21/16 1440      Knowledge Questionnaire Score   Pre Score 22/28      Core Components/Risk Factors/Patient Goals at Admission:     Personal Goals and Risk Factors at Admission - 05/21/16 1443      Core Components/Risk Factors/Patient Goals on Admission    Weight Management Yes;Weight Loss   Intervention Weight Management: Develop a combined nutrition and exercise program designed to reach desired caloric intake, while maintaining appropriate intake of nutrient and fiber, sodium and fats, and appropriate energy expenditure required for the weight goal.;Weight Management: Provide education and appropriate resources to help participant work on and attain dietary goals.   Admit Weight 193 lb 11.2 oz (87.9 kg)   Goal Weight: Short Term 190 lb (86.2 kg)   Goal Weight: Long Term 180 lb (81.6 kg)   Expected Outcomes Short Term: Continue to assess and modify interventions until short term weight is achieved;Long Term: Adherence to nutrition and physical activity/exercise program aimed toward attainment of established weight goal;Weight Loss: Understanding of general recommendations for a balanced deficit meal plan, which promotes 1-2 lb weight loss per week and includes a negative energy balance of [id number] kcal/d;Understanding recommendations for meals to include 15-35% energy as protein, 25-35% energy from fat, 35-60% energy from carbohydrates, less than 200mg  of dietary cholesterol, 20-35 gm of total fiber daily;Understanding of distribution of calorie intake throughout the day with the consumption of 4-5 meals/snacks    Increase Strength and Stamina Yes   Intervention Provide advice, education, support and counseling about physical activity/exercise needs.;Develop an individualized exercise prescription for aerobic and resistive training based on initial evaluation findings, risk stratification, comorbidities and participant's personal goals.   Expected Outcomes Achievement of increased cardiorespiratory fitness and enhanced flexibility, muscular endurance and strength shown through measurements of functional capacity and personal statement of participant.   Diabetes Yes  Diet and exercise controlled   Intervention Provide education about signs/symptoms and action to take for hypo/hyperglycemia.;Provide education about proper nutrition, including hydration, and aerobic/resistive exercise prescription along with prescribed medications to achieve blood glucose in normal ranges: Fasting glucose 65-99 mg/dL   Expected Outcomes Short Term: Participant verbalizes understanding of the signs/symptoms and immediate care  of hyper/hypoglycemia, proper foot care and importance of medication, aerobic/resistive exercise and nutrition plan for blood glucose control.;Long Term: Attainment of HbA1C < 7%.   Hypertension Yes   Intervention Provide education on lifestyle modifcations including regular physical activity/exercise, weight management, moderate sodium restriction and increased consumption of fresh fruit, vegetables, and low fat dairy, alcohol moderation, and smoking cessation.;Monitor prescription use compliance.   Expected Outcomes Short Term: Continued assessment and intervention until BP is < 140/21mm HG in hypertensive participants. < 130/2mm HG in hypertensive participants with diabetes, heart failure or chronic kidney disease.;Long Term: Maintenance of blood pressure at goal levels.   Lipids Yes   Intervention Provide education and support for participant on nutrition & aerobic/resistive exercise along with prescribed  medications to achieve LDL 70mg , HDL >40mg .   Expected Outcomes Short Term: Participant states understanding of desired cholesterol values and is compliant with medications prescribed. Participant is following exercise prescription and nutrition guidelines.;Long Term: Cholesterol controlled with medications as prescribed, with individualized exercise RX and with personalized nutrition plan. Value goals: LDL < 70mg , HDL > 40 mg.   Personal Goal Other Yes   Personal Goal Better exercise routine that can be maintained long term   Intervention Provide exercise prescription in class and for at home   Expected Outcomes Able to be independent with exercise and establish routine      Core Components/Risk Factors/Patient Goals Review:    Core Components/Risk Factors/Patient Goals at Discharge (Final Review):    ITP Comments:     ITP Comments    Row Name 05/21/16 1447 05/30/16 0644         ITP Comments Initial ITP created during medical review. Diagnosis documentation found [facility] in [place] 04/26/2016 30 day review. Continue with ITP unless changes noted by Medical Director at signature of review. New to program         Comments:

## 2016-05-31 DIAGNOSIS — Z955 Presence of coronary angioplasty implant and graft: Secondary | ICD-10-CM

## 2016-05-31 DIAGNOSIS — Z48812 Encounter for surgical aftercare following surgery on the circulatory system: Secondary | ICD-10-CM | POA: Diagnosis not present

## 2016-05-31 LAB — GLUCOSE, CAPILLARY
GLUCOSE-CAPILLARY: 137 mg/dL — AB (ref 65–99)
GLUCOSE-CAPILLARY: 101 mg/dL — AB (ref 65–99)

## 2016-05-31 NOTE — Progress Notes (Signed)
Daily Session Note  Patient Details  Name: Paul Choi MRN: [id number] Date of Birth: 23-Dec-1945 Referring Provider:   Flowsheet Row Cardiac Rehab from 05/21/2016 in [facility] Cardiac and Pulmonary Rehab  Referring Provider  Virl Axe MD      Encounter Date: 05/31/2016  Check In:     Session Check In - 05/31/16 0853      Check-In   Location [facility]-Cardiac & Pulmonary Rehab   Staff Present Alberteen Sam, MA, ACSM RCEP, Exercise Physiologist;Sunni Richardson Oletta Darter, BA, ACSM CEP, Exercise Physiologist;Carroll Enterkin, RN, BSN   Supervising physician immediately available to respond to emergencies See telemetry face sheet for immediately available ER MD   Medication changes reported     No   Fall or balance concerns reported    No   Warm-up and Cool-down Performed on first and last piece of equipment   Resistance Training Performed Yes   VAD Patient? No     Pain Assessment   Currently in Pain? No/denies   Multiple Pain Sites No         Goals Met:  Independence with exercise equipment Exercise tolerated well No report of cardiac concerns or symptoms Strength training completed today  Goals Unmet:  Not Applicable  Comments: Pt able to follow exercise prescription today without complaint.  Will continue to monitor for progression.    Dr. Emily Filbert is Medical Director for [place] and [facility].

## 2016-06-05 ENCOUNTER — Encounter: Payer: Medicare Other | Admitting: *Deleted

## 2016-06-05 DIAGNOSIS — Z48812 Encounter for surgical aftercare following surgery on the circulatory system: Secondary | ICD-10-CM | POA: Diagnosis not present

## 2016-06-05 DIAGNOSIS — Z955 Presence of coronary angioplasty implant and graft: Secondary | ICD-10-CM

## 2016-06-05 NOTE — Progress Notes (Signed)
Daily Session Note  Patient Details  Name: Paul Choi MRN: [id number] Date of Birth: 20-Dec-1945 Referring Provider:   Flowsheet Row Cardiac Rehab from 05/21/2016 in [facility] Cardiac and Pulmonary Rehab  Referring Provider  Virl Axe MD      Encounter Date: 06/05/2016  Check In:     Session Check In - 06/05/16 0820      Check-In   Location [facility]-Cardiac & Pulmonary Rehab   Staff Present Alberteen Sam, MA, ACSM RCEP, Exercise Physiologist;Susanne Bice, RN, BSN, CCRP;Laureen Owens Shark, BS, RRT, Respiratory Therapist   Supervising physician immediately available to respond to emergencies See telemetry face sheet for immediately available ER MD   Medication changes reported     No   Fall or balance concerns reported    No   Warm-up and Cool-down Performed on first and last piece of equipment   Resistance Training Performed Yes   VAD Patient? No     Pain Assessment   Currently in Pain? No/denies   Multiple Pain Sites No         Goals Met:  Independence with exercise equipment Exercise tolerated well No report of cardiac concerns or symptoms Strength training completed today  Goals Unmet:  Not Applicable  Comments: Pt able to follow exercise prescription today without complaint.  Will continue to monitor for progression.  Reviewed home exercise with pt today.  Pt plans to walk and go to clubhouse in neighborhood for exercise.  Reviewed THR, pulse, RPE, sign and symptoms, and when to call 911 or MD.  Also discussed weather considerations and indoor options.  Pt voiced understanding.   Dr. Emily Filbert is Medical Director for [place] and [facility].

## 2016-06-07 DIAGNOSIS — Z48812 Encounter for surgical aftercare following surgery on the circulatory system: Secondary | ICD-10-CM | POA: Diagnosis not present

## 2016-06-07 DIAGNOSIS — Z955 Presence of coronary angioplasty implant and graft: Secondary | ICD-10-CM

## 2016-06-07 NOTE — Progress Notes (Signed)
Daily Session Note  Patient Details  Name: MC BLOODWORTH MRN: [id number] Date of Birth: 05-20-1946 Referring Provider:   Flowsheet Row Cardiac Rehab from 05/21/2016 in [facility] Cardiac and Pulmonary Rehab  Referring Provider  Virl Axe MD      Encounter Date: 06/07/2016  Check In:     Session Check In - 06/07/16 0849      Check-In   Location [facility]-Cardiac & Pulmonary Rehab   Staff Present Heath Lark, RN, BSN, CCRP;Jessica Luan Pulling, MA, ACSM RCEP, Exercise Physiologist;Alexzia Kasler Oletta Darter, BA, ACSM CEP, Exercise Physiologist   Supervising physician immediately available to respond to emergencies See telemetry face sheet for immediately available ER MD   Medication changes reported     No   Fall or balance concerns reported    No   Warm-up and Cool-down Performed on first and last piece of equipment   Resistance Training Performed Yes   VAD Patient? No     Pain Assessment   Currently in Pain? No/denies   Multiple Pain Sites No         Goals Met:  Independence with exercise equipment Exercise tolerated well No report of cardiac concerns or symptoms Strength training completed today  Goals Unmet:  Not Applicable  Comments: Pt able to follow exercise prescription today without complaint.  Will continue to monitor for progression.    Dr. Emily Filbert is Medical Director for [place] and [facility].

## 2016-06-12 ENCOUNTER — Encounter: Payer: Medicare Other | Admitting: *Deleted

## 2016-06-12 DIAGNOSIS — Z48812 Encounter for surgical aftercare following surgery on the circulatory system: Secondary | ICD-10-CM | POA: Diagnosis not present

## 2016-06-12 DIAGNOSIS — Z955 Presence of coronary angioplasty implant and graft: Secondary | ICD-10-CM

## 2016-06-12 NOTE — Progress Notes (Signed)
Daily Session Note  Patient Details  Name: Nyree R Rodrigues MRN: [id number] Date of Birth: 06/03/1946 Referring Provider:   Flowsheet Row Cardiac Rehab from 05/21/2016 in [facility]  Referring Provider  Klein, Steven MD      Encounter Date: 06/12/2016  Check In:     Session Check In - 06/12/16 0838      Check-In   Location [facility]   Staff Present Jessica Hawkins, MA, ACSM RCEP, Exercise Physiologist;Susanne Bice, RN, BSN, CCRP;Laureen Brown, BS, RRT, Respiratory Therapist;Other   Supervising physician immediately available to respond to emergencies See telemetry face sheet for immediately available ER MD   Medication changes reported     No   Fall or balance concerns reported    No   Warm-up and Cool-down Performed on first and last piece of equipment   Resistance Training Performed Yes   VAD Patient? No     Pain Assessment   Currently in Pain? No/denies   Multiple Pain Sites No         Goals Met:  Independence with exercise equipment Exercise tolerated well No report of cardiac concerns or symptoms Strength training completed today  Goals Unmet:  Not Applicable  Comments: Pt able to follow exercise prescription today without complaint.  Will continue to monitor for progression.    Dr. Mark Miller is Medical Director for [facility] and [facility]. 

## 2016-06-12 NOTE — Progress Notes (Signed)
Daily Session Note  Patient Details  Name: Paul Choi MRN: [id number] Date of Birth: [birth year] Referring Provider:   Flowsheet Row Cardiac Rehab from 05/21/2016 in [facility]  Referring Provider  Virl Axe MD      Encounter Date: 06/12/2016  Check In:     Session Check In - 06/12/16 0838      Check-In   Location [facility]   Staff Present Alberteen Sam, MA, ACSM RCEP, Exercise Physiologist;Aroush Chasse, RN, BSN, CCRP;Laureen Owens Shark, BS, RRT, Respiratory Therapist;Other   Supervising physician immediately available to respond to emergencies See telemetry face sheet for immediately available ER MD   Medication changes reported     No   Fall or balance concerns reported    No   Warm-up and Cool-down Performed on first and last piece of equipment   Resistance Training Performed Yes   VAD Patient? No     Pain Assessment   Currently in Pain? No/denies   Multiple Pain Sites No         Goals Met:  Independence with exercise equipment Exercise tolerated well No report of cardiac concerns or symptoms Strength training completed today  Goals Unmet:  Not Applicable  Comments: Doing well with exercise prescription progression.    Dr. Emily Filbert is Medical Director for [place] and [facility].

## 2016-06-14 ENCOUNTER — Encounter: Payer: Medicare Other | Admitting: *Deleted

## 2016-06-14 DIAGNOSIS — Z48812 Encounter for surgical aftercare following surgery on the circulatory system: Secondary | ICD-10-CM | POA: Diagnosis not present

## 2016-06-14 DIAGNOSIS — Z955 Presence of coronary angioplasty implant and graft: Secondary | ICD-10-CM

## 2016-06-14 NOTE — Progress Notes (Signed)
Daily Session Note  Patient Details  Name: Paul Choi MRN: [id number] Date of Birth: [birth year] Referring Provider:   Flowsheet Row Cardiac Rehab from 05/21/2016 in [facility]  Referring Provider  Virl Axe MD      Encounter Date: 06/14/2016  Check In:     Session Check In - 06/14/16 0911      Check-In   Location [facility]   Staff Present Gerlene Burdock, RN, Vickki Hearing, BA, ACSM CEP, Exercise Physiologist;Jessica Luan Pulling, MA, ACSM RCEP, Exercise Physiologist   Medication changes reported     No   Fall or balance concerns reported    No   Warm-up and Cool-down Performed on first and last piece of equipment   Resistance Training Performed Yes   VAD Patient? No     Pain Assessment   Currently in Pain? No/denies         Goals Met:  Proper associated with RPD/PD & O2 Sat Exercise tolerated well  Goals Unmet:  Not Applicable  Comments:     Dr. Emily Filbert is Medical Director for [place] and [facility].

## 2016-06-19 ENCOUNTER — Encounter: Payer: Medicare Other | Admitting: *Deleted

## 2016-06-19 DIAGNOSIS — Z955 Presence of coronary angioplasty implant and graft: Secondary | ICD-10-CM

## 2016-06-19 DIAGNOSIS — Z48812 Encounter for surgical aftercare following surgery on the circulatory system: Secondary | ICD-10-CM | POA: Diagnosis not present

## 2016-06-19 NOTE — Progress Notes (Signed)
Daily Session Note  Patient Details  Name: Paul Choi MRN: [id number] Date of Birth: [birth year] Referring Provider:   Flowsheet Row Cardiac Rehab from 05/21/2016 in [facility]  Referring Provider  Virl Axe MD      Encounter Date: 06/19/2016  Check In:     Session Check In - 06/19/16 0812      Check-In   Location [facility]   Staff Present Alberteen Sam, MA, ACSM RCEP, Exercise Physiologist;Susanne Bice, RN, BSN, CCRP;Laureen Owens Shark, BS, RRT, Respiratory Therapist;Other   Supervising physician immediately available to respond to emergencies See telemetry face sheet for immediately available ER MD   Medication changes reported     No   Fall or balance concerns reported    No   Warm-up and Cool-down Performed as group-led instruction   Resistance Training Performed Yes   VAD Patient? No     Pain Assessment   Currently in Pain? No/denies   Multiple Pain Sites No         Goals Met:  Independence with exercise equipment Exercise tolerated well No report of cardiac concerns or symptoms Strength training completed today  Goals Unmet:  Not Applicable  Comments: Pt able to follow exercise prescription today without complaint.  Will continue to monitor for progression.    Dr. Emily Filbert is Medical Director for [place] and [facility].

## 2016-06-21 ENCOUNTER — Encounter: Payer: Medicare Other | Attending: Cardiovascular Disease

## 2016-06-21 DIAGNOSIS — Z9889 Other specified postprocedural states: Secondary | ICD-10-CM | POA: Diagnosis present

## 2016-06-21 DIAGNOSIS — Z48812 Encounter for surgical aftercare following surgery on the circulatory system: Secondary | ICD-10-CM | POA: Diagnosis not present

## 2016-06-21 DIAGNOSIS — Z955 Presence of coronary angioplasty implant and graft: Secondary | ICD-10-CM | POA: Insufficient documentation

## 2016-06-21 NOTE — Progress Notes (Signed)
Daily Session Note  Patient Details  Name: Paul Choi MRN: [id number] Date of Birth: [birth year] Referring Provider:   Flowsheet Row Cardiac Rehab from 05/21/2016 in [facility] Card[facility] and [facility] Re[facility]  Referring Provider  Virl Axe MD      Encounter Date: 06/21/2016  Check In:     Session Check In - 06/21/16 1002      Check-In   Location [facility]   Staff Present Heath Lark, RN, BSN, CCRP;Jessica Victoria, MA, ACSM RCEP, Exercise Physiologist;Amanda Oletta Darter, BA, ACSM CEP, Exercise Physiologist   Supervising physician immediately available to respond to emergencies See telemetry face sheet for immediately available ER MD   Medication changes reported     No   Fall or balance concerns reported    No   Warm-up and Cool-down Performed on first and last piece of equipment   Resistance Training Performed Yes   VAD Patient? No     Pain Assessment   Currently in Pain? No/denies   Multiple Pain Sites No         Goals Met:  Independence with exercise equipment Exercise tolerated well No report of cardiac concerns or symptoms Strength training completed today  Goals Unmet:  Not Applicable  Comments: Pt able to follow exercise prescription today without complaint.  Will continue to monitor for progression.    Dr. Emily Filbert is Medical Director for [place] and [facility].

## 2016-06-26 ENCOUNTER — Encounter: Payer: Medicare Other | Admitting: *Deleted

## 2016-06-26 DIAGNOSIS — Z48812 Encounter for surgical aftercare following surgery on the circulatory system: Secondary | ICD-10-CM | POA: Diagnosis not present

## 2016-06-26 DIAGNOSIS — Z955 Presence of coronary angioplasty implant and graft: Secondary | ICD-10-CM

## 2016-06-26 NOTE — Progress Notes (Signed)
Daily Session Note  Patient Details  Name: Paul Choi MRN: [id number] Date of Birth: 12/22/1945 Referring Provider:   Flowsheet Row Cardiac Rehab from 05/21/2016 in [facility] Re[facility]  Referring Provider  Virl Axe MD      Encounter Date: 06/26/2016  Check In:     Session Check In - 06/26/16 0817      Check-In   Location [facility]   Staff Present Alberteen Sam, MA, ACSM RCEP, Exercise Physiologist;Susanne Bice, RN, BSN, CCRP;Laureen Owens Shark, BS, RRT, Respiratory Therapist   Supervising physician immediately available to respond to emergencies See telemetry face sheet for immediately available ER MD   Medication changes reported     No   Fall or balance concerns reported    No   Warm-up and Cool-down Performed on first and last piece of equipment   Resistance Training Performed Yes   VAD Patient? No     Pain Assessment   Currently in Pain? No/denies   Multiple Pain Sites No         Goals Met:  Independence with exercise equipment Exercise tolerated well No report of cardiac concerns or symptoms Strength training completed today  Goals Unmet:  Not Applicable  Comments: Pt able to follow exercise prescription today without complaint.  Will continue to monitor for progression.    Dr. Emily Filbert is Medical Director for [place] and [facility].

## 2016-06-27 ENCOUNTER — Encounter: Payer: Self-pay | Admitting: *Deleted

## 2016-06-27 DIAGNOSIS — Z955 Presence of coronary angioplasty implant and graft: Secondary | ICD-10-CM

## 2016-06-27 NOTE — Progress Notes (Signed)
Cardiac Individual Treatment Plan  Patient Details  Name: Paul Choi MRN: [id number] Date of Birth: 1946-04-29 Referring Provider:   Flowsheet Row Cardiac Rehab from 05/21/2016 in [facility] Cardiac and Pulmonary Rehab  Referring Provider  Virl Axe MD      Initial Encounter Date:  Flowsheet Row Cardiac Rehab from 05/21/2016 in [facility] Cardiac and Pulmonary Rehab  Date  05/21/16  Referring Provider  Virl Axe MD      Visit Diagnosis: Status post coronary artery stent placement  Patient's Home Medications on Admission:  Current Outpatient Prescriptions:  .  allopurinol (ZYLOPRIM) 100 MG tablet, Take 200 mg by mouth every evening. , Disp: , Rfl:  .  amLODipine (NORVASC) 5 MG tablet, Take 5 mg by mouth every evening. , Disp: , Rfl:  .  aspirin EC 81 MG tablet, Take 81 mg by mouth daily., Disp: , Rfl:  .  atorvastatin (LIPITOR) 40 MG tablet, Take 1 tablet (40 mg total) by mouth every evening., Disp: , Rfl:  .  cholecalciferol (VITAMIN D) 1000 units tablet, Take 1,000 Units by mouth every evening. , Disp: , Rfl:  .  clopidogrel (PLAVIX) 75 MG tablet, Take 1 tablet (75 mg total) by mouth every evening., Disp: 90 tablet, Rfl: 3 .  colchicine 0.6 MG tablet, Take 0.6 mg by mouth daily as needed (for gout flare ups.). , Disp: , Rfl:  .  docusate sodium (COLACE) 100 MG capsule, Take 200-300 mg by mouth daily as needed for mild constipation., Disp: , Rfl:  .  Fish Oil-Cholecalciferol (FISH OIL + D3 PO), Take 1 capsule by mouth daily. , Disp: , Rfl:  .  losartan (COZAAR) 100 MG tablet, Take 1 tablet (100 mg total) by mouth every evening., Disp: , Rfl:  .  polyethylene glycol (MIRALAX / GLYCOLAX) packet, Take 17 g by mouth daily as needed for mild constipation., Disp: , Rfl:   Past Medical History: Past Medical History:  Diagnosis Date  . Arteriosclerotic cardiovascular disease   . Arthritis 1997  . Coronary artery disease    Cardiac catheterization in August 2003 with occluded left  circumflex. Echo 8/13 with mild segmental LV dysfunction, EF 40%, moderate mitral insufficiency and mild tricuspid insufficiency; aortic valve appears bicuspid  . Diabetes mellitus without complication ([place])    TYPE 2   . ED (erectile dysfunction)   . Gout   . Gout   . Heart disease   . Hyperlipidemia   . Hypertension 1982  . Insomnia   . Personal history of colonic polyps   . Renal insufficiency     Tobacco Use: History  Smoking Status  . Former Smoker  . Packs/day: 1.00  . Years: 25.00  . Quit date: 09/22/1995  Smokeless Tobacco  . Never Used    Labs: Recent Review Flowsheet Data    There is no flowsheet data to display.       Exercise Target Goals:    Exercise Program Goal: Individual exercise prescription set with THRR, safety & activity barriers. Participant demonstrates ability to understand and report RPE using BORG scale, to self-measure pulse accurately, and to acknowledge the importance of the exercise prescription.  Exercise Prescription Goal: Starting with aerobic activity 30 plus minutes a day, 3 days per week for initial exercise prescription. Provide home exercise prescription and guidelines that participant acknowledges understanding prior to discharge.  Activity Barriers & Risk Stratification:     Activity Barriers & Cardiac Risk Stratification - 05/21/16 1440      Activity Barriers &  Cardiac Risk Stratification   Activity Barriers Other (comment);Balance Concerns  Gout, no flare in quite awhile, previous knee surgery   Cardiac Risk Stratification High      6 Minute Walk:     6 Minute Walk    Row Name 05/21/16 1524         6 Minute Walk   Phase Initial     Distance 1245 feet     Walk Time 6 minutes     # of Rest Breaks 0     MPH 2.36     METS 3     RPE 9     VO2 Peak 10.53     Symptoms No     Resting HR 74 bpm     Resting BP 146/74     Max Ex. HR 114 bpm     Max Ex. BP 130/60     2 Minute Post BP 126/70        Initial  Exercise Prescription:     Initial Exercise Prescription - 05/21/16 1500      Date of Initial Exercise RX and Referring Provider   Date 05/21/16   Referring Provider Virl Axe MD     Treadmill   MPH 2   Grade 1.5   Minutes 15   METs 2.94     NuStep   Level 3   Watts --  80-100 spm   Minutes 15   METs 2     REL-XR   Level 3   Watts --  speed 50 rpm   Minutes 15   METs 3     Prescription Details   Frequency (times per week) 2   Duration Progress to 45 minutes of aerobic exercise without signs/symptoms of physical distress     Intensity   THRR 40-80% of Max Heartrate 104-133   Ratings of Perceived Exertion 11-13   Perceived Dyspnea 0-4     Progression   Progression Continue to progress workloads to maintain intensity without signs/symptoms of physical distress.     Resistance Training   Training Prescription Yes   Weight 3 lbs   Reps 10-12      Perform Capillary Blood Glucose checks as needed.  Exercise Prescription Changes:     Exercise Prescription Changes    Row Name 05/21/16 1500 06/05/16 1400 06/20/16 0900         Exercise Review   Progression -  walk test results Yes Yes       Response to Exercise   Blood Pressure (Admit) 146/74 134/70 148/70     Blood Pressure (Exercise) 130/60 126/70 160/84     Blood Pressure (Exit) 126/70 126/70 148/70     Heart Rate (Admit) 74 bpm 60 bpm 72 bpm     Heart Rate (Exercise) 114 bpm 127 bpm 107 bpm     Heart Rate (Exit) 75 bpm 73 bpm 83 bpm     Oxygen Saturation (Admit) 100 %  -  -     Oxygen Saturation (Exit) 100 %  -  -     Rating of Perceived Exertion (Exercise) _0 Symptoms none none none     Comments  - Home Exercise Guidelines given 06/05/16 Home Exercise Guidelines given 06/05/16     Duration  - Progress to 45 minutes of aerobic exercise without signs/symptoms of physical distress Progress to 45 minutes of aerobic exercise without signs/symptoms of physical distress     Intensity  - THRR  unchanged THRR unchanged       Progression   Progression  - Continue to progress workloads to maintain intensity without signs/symptoms of physical distress. Continue to progress workloads to maintain intensity without signs/symptoms of physical distress.     Average METs  - 3 3.55       Resistance Training   Training Prescription  - Yes Yes     Weight  - 6 lbs 6 lbs     Reps  - 10-12 10-12       Interval Training   Interval Training  - No No       Treadmill   MPH  - 2.3 2.5     Grade  - 2 2.5     Minutes  - 15 15     METs  - 3.4 3.95       NuStep   Level  - 4 4     Minutes  - 15 15     METs  - 2.7 2.7       REL-XR   Level  - 3 3     Minutes  - 15 15     METs  - 3.9 4       Home Exercise Plan   Plans to continue exercise at  - Home  walking, weights, and possibly going to community club house Home  walking, weights, and possibly going to community club house     Frequency  - Add 3 additional days to program exercise sessions. Add 3 additional days to program exercise sessions.        Exercise Comments:     Exercise Comments    Row Name 05/21/16 1527 06/05/16 1057 06/05/16 1437 06/20/16 0928     Exercise Comments Exercise goals are to lose weight and get into a better exercise routine that he can maintain long term. Reviewed home exercise with pt today.  Pt plans to walk and go to clubhouse in neighborhood for exercise.  Reviewed THR, pulse, RPE, sign and symptoms, and when to call 911 or MD.  Also discussed weather considerations and indoor options.  Pt voiced understanding. Clair Gulling is off to a good start with his exercise.  He is already doing level 4 on the NuStep.  We will continue to monitor for progression. Jim continunes to do well in rehab.  He is working hard and getting 4 METs on the XR.  We will continue to monitor his progress.       Discharge Exercise Prescription (Final Exercise Prescription Changes):     Exercise Prescription Changes - 06/20/16 0900       Exercise Review   Progression Yes     Response to Exercise   Blood Pressure (Admit) 148/70   Blood Pressure (Exercise) 160/84   Blood Pressure (Exit) 148/70   Heart Rate (Admit) 72 bpm   Heart Rate (Exercise) 107 bpm   Heart Rate (Exit) 83 bpm   Rating of Perceived Exertion (Exercise) 12   Symptoms none   Comments Home Exercise Guidelines given 06/05/16   Duration Progress to 45 minutes of aerobic exercise without signs/symptoms of physical distress   Intensity THRR unchanged     Progression   Progression Continue to progress workloads to maintain intensity without signs/symptoms of physical distress.   Average METs 3.55     Resistance Training   Training Prescription Yes   Weight 6 lbs   Reps 10-12     Interval Training   Interval Training No  Treadmill   MPH 2.5   Grade 2.5   Minutes 15   METs 3.95     NuStep   Level 4   Minutes 15   METs 2.7     REL-XR   Level 3   Minutes 15   METs 4     Home Exercise Plan   Plans to continue exercise at Home  walking, weights, and possibly going to community club house   Frequency Add 3 additional days to program exercise sessions.      Nutrition:  Target Goals: Understanding of nutrition guidelines, daily intake of sodium <1576m, cholesterol <2038m calories 30% from fat and 7% or less from saturated fats, daily to have 5 or more servings of fruits and vegetables.  Biometrics:     Pre Biometrics - 05/21/16 1530      Pre Biometrics   Height 6' 0.4" (1.839 m)   Weight 193 lb 11.2 oz (87.9 kg)   Waist Circumference 39 inches   Hip Circumference 35 inches   Waist to Hip Ratio 1.11 %   BMI (Calculated) 26   Single Leg Stand 1.38 seconds       Nutrition Therapy Plan and Nutrition Goals:     Nutrition Therapy & Goals - 06/14/16 1122      Nutrition Therapy   Diet 1900kcal Diabetes/ DASH guidelines   Drug/Food Interactions Statins/Certain Fruits;Purine/Gout   Protein (specify units) 10oz   Fiber 30 grams    Whole Grain Foods 3 servings   Saturated Fats 13 max. grams   Fruits and Vegetables 5 servings/day   Sodium 1500 grams     Personal Nutrition Goals   Personal Goal #1 Control food portions at dinner meal -- especially starches, maybe meats. Eat pleny of low-carb vegetables.   Personal Goal #2 Add a snack/ lite meal at about noon, and another at about 8-9pm. (for blood sugar control and better appetite control at main meal)   Comments Mr. Olver has been working on heMiranthanges, and has decreased sodium and unhealthy fats in his diet.      Intervention Plan   Intervention Prescribe, educate and counsel regarding individualized specific dietary modifications aiming towards targeted core components such as weight, hypertension, lipid management, diabetes, heart failure and other comorbidities.;Nutrition handout(s) given to patient.   Expected Outcomes Short Term Goal: Understand basic principles of dietary content, such as calories, fat, sodium, cholesterol and nutrients.;Short Term Goal: A plan has been developed with personal nutrition goals set during dietitian appointment.;Long Term Goal: Adherence to prescribed nutrition plan.      Nutrition Discharge: Rate Your Plate Scores:     Nutrition Assessments - 05/21/16 1441      Rate Your Plate Scores   Pre Score 48   Pre Score % 55 %      Nutrition Goals Re-Evaluation:   Psychosocial: Target Goals: Acknowledge presence or absence of depression, maximize coping skills, provide positive support system. Participant is able to verbalize types and ability to use techniques and skills needed for reducing stress and depression.  Initial Review & Psychosocial Screening:     Initial Psych Review & Screening - 05/21/16 1452      Initial Review   Current issues with Current Sleep Concerns  Will use OTC to help with sleep occasionally      Quality of Life Scores:     Quality of Life - 05/21/16 1438      Quality of Life  Scores   Health/Function Pre 21 %  Socioeconomic Pre 20.29 %   Psych/Spiritual Pre 20.86 %   Family Pre 21 %   GLOBAL Pre 20.82 %      PHQ-9: Recent Review Flowsheet Data    Depression screen [facility] 2/9 05/21/2016   Decreased Interest 0   Down, Depressed, Hopeless 0   PHQ - 2 Score 0   Altered sleeping 0   Tired, decreased energy 0   Change in appetite 2   Feeling bad or failure about yourself  0   Trouble concentrating 0   Moving slowly or fidgety/restless 0   Suicidal thoughts 0   PHQ-9 Score 2   Difficult doing work/chores Not difficult at all      Psychosocial Evaluation and Intervention:     Psychosocial Evaluation - 06/05/16 0932      Psychosocial Evaluation & Interventions   Comments Counselor met with Mr. Taaffe (Mr. D) today for initial psychosocial evaluation.  He is a 70 year old who had a stent inserted in September.  He has a strong support system with a spouse of 34 years and (3) adult children who live close by.  Mr. D also lives in a retirement community and is actively involved in his local church.  He also has borderline diabetes that is being treated with exercise and diet currently and he also has a liver disease currently.  He reports sleeping well with an occasional OTC sleep aid PRN; and his appetite is good.  He denies a history or current symptoms of depression or anxiety and states he is typically in a positive mood most of the time.  Mr. D states he has minimal stress other than "worrying" about his (12) grandchildren ages 64-26 occasionally.  His goals for this program are to drop some weight and to increase his stamina and strength.        Psychosocial Re-Evaluation:   Vocational Rehabilitation: Provide vocational rehab assistance to qualifying candidates.   Vocational Rehab Evaluation & Intervention:     Vocational Rehab - 05/21/16 1441      Initial Vocational Rehab Evaluation & Intervention   Assessment shows need for Vocational  Rehabilitation No      Education: Education Goals: Education classes will be provided on a weekly basis, covering required topics. Participant will state understanding/return demonstration of topics presented.  Learning Barriers/Preferences:     Learning Barriers/Preferences - 05/21/16 1440      Learning Barriers/Preferences   Learning Barriers None   Learning Preferences None      Education Topics: General Nutrition Guidelines/Fats and Fiber: -Group instruction provided by verbal, written material, models and posters to present the general guidelines for heart healthy nutrition. Gives an explanation and review of dietary fats and fiber.   Controlling Sodium/Reading Food Labels: -Group verbal and written material supporting the discussion of sodium use in heart healthy nutrition. Review and explanation with models, verbal and written materials for utilization of the food label.   Exercise Physiology & Risk Factors: - Group verbal and written instruction with models to review the exercise physiology of the cardiovascular system and associated critical values. Details cardiovascular disease risk factors and the goals associated with each risk factor.   Aerobic Exercise & Resistance Training: - Gives group verbal and written discussion on the health impact of inactivity. On the components of aerobic and resistive training programs and the benefits of this training and how to safely progress through these programs. Flowsheet Row Cardiac Rehab from 06/26/2016 in [facility] Cardiac and Pulmonary Rehab  Date  05/24/16  Educator  [facility]  Instruction Review Code  2- meets goals/outcomes      Flexibility, Balance, General Exercise Guidelines: - Provides group verbal and written instruction on the benefits of flexibility and balance training programs. Provides general exercise guidelines with specific guidelines to those with heart or lung disease. Demonstration and skill practice  provided. Flowsheet Row Cardiac Rehab from 06/26/2016 in [facility] Cardiac and Pulmonary Rehab  Date  05/29/16  Educator  [facility]  Instruction Review Code  2- meets goals/outcomes      Stress Management: - Provides group verbal and written instruction about the health risks of elevated stress, cause of high stress, and healthy ways to reduce stress. Flowsheet Row Cardiac Rehab from 06/26/2016 in [facility] Cardiac and Pulmonary Rehab  Date  06/07/16  Educator  SB  Instruction Review Code  2- meets goals/outcomes      Depression: - Provides group verbal and written instruction on the correlation between heart/lung disease and depressed mood, treatment options, and the stigmas associated with seeking treatment.   Anatomy & Physiology of the Heart: - Group verbal and written instruction and models provide basic cardiac anatomy and physiology, with the coronary electrical and arterial systems. Review of: AMI, Angina, Valve disease, Heart Failure, Cardiac Arrhythmia, Pacemakers, and the ICD. Flowsheet Row Cardiac Rehab from 06/26/2016 in [facility] Cardiac and Pulmonary Rehab  Date  06/05/16  Educator  SB  Instruction Review Code  2- meets goals/outcomes      Cardiac Procedures: - Group verbal and written instruction and models to describe the testing methods done to diagnose heart disease. Reviews the outcomes of the test results. Describes the treatment choices: Medical Management, Angioplasty, or Coronary Bypass Surgery. Flowsheet Row Cardiac Rehab from 06/26/2016 in [facility] Cardiac and Pulmonary Rehab  Date  06/12/16  Educator  SB  Instruction Review Code  2- meets goals/outcomes      Cardiac Medications: - Group verbal and written instruction to review commonly prescribed medications for heart disease. Reviews the medication, class of the drug, and side effects. Includes the steps to properly store meds and maintain the prescription regimen. Flowsheet Row Cardiac Rehab from 06/26/2016 in [facility] Cardiac and  Pulmonary Rehab  Date  06/14/16 [10/31 Part 2]  Educator  C. EnterkinRN  Instruction Review Code  2- meets goals/outcomes      Go Sex-Intimacy & Heart Disease, Get SMART - Goal Setting: - Group verbal and written instruction through game format to discuss heart disease and the return to sexual intimacy. Provides group verbal and written material to discuss and apply goal setting through the application of the S.M.A.R.T. Method. Flowsheet Row Cardiac Rehab from 06/26/2016 in [facility] Cardiac and Pulmonary Rehab  Date  06/12/16  Educator  SB  Instruction Review Code  2- meets goals/outcomes      Other Matters of the Heart: - Provides group verbal, written materials and models to describe Heart Failure, Angina, Valve Disease, and Diabetes in the realm of heart disease. Includes description of the disease process and treatment options available to the cardiac patient. Flowsheet Row Cardiac Rehab from 06/26/2016 in [facility] Cardiac and Pulmonary Rehab  Date  06/05/16  Educator  SB  Instruction Review Code  2- meets goals/outcomes      Exercise & Equipment Safety: - Individual verbal instruction and demonstration of equipment use and safety with use of the equipment. Flowsheet Row Cardiac Rehab from 06/26/2016 in [facility] Cardiac and Pulmonary Rehab  Date  05/21/16  Educator  SB  Instruction Review Code  2- meets goals/outcomes      Infection Prevention: - Provides verbal and written material to individual with discussion of infection control including proper hand washing and proper equipment cleaning during exercise session. Flowsheet Row Cardiac Rehab from 06/26/2016 in [facility] Cardiac and Pulmonary Rehab  Date  05/21/16  Educator  SB  Instruction Review Code  2- meets goals/outcomes      Falls Prevention: - Provides verbal and written material to individual with discussion of falls prevention and safety.   Diabetes: - Individual verbal and written instruction to review signs/symptoms of  diabetes, desired ranges of glucose level fasting, after meals and with exercise. Advice that pre and post exercise glucose checks will be done for 3 sessions at entry of program. Flowsheet Row Cardiac Rehab from 06/26/2016 in [facility] Cardiac and Pulmonary Rehab  Date  05/21/16  Educator  SB  Instruction Review Code  2- meets goals/outcomes       Knowledge Questionnaire Score:     Knowledge Questionnaire Score - 05/21/16 1440      Knowledge Questionnaire Score   Pre Score 22/28      Core Components/Risk Factors/Patient Goals at Admission:     Personal Goals and Risk Factors at Admission - 05/21/16 1443      Core Components/Risk Factors/Patient Goals on Admission    Weight Management Yes;Weight Loss   Intervention Weight Management: Develop a combined nutrition and exercise program designed to reach desired caloric intake, while maintaining appropriate intake of nutrient and fiber, sodium and fats, and appropriate energy expenditure required for the weight goal.;Weight Management: Provide education and appropriate resources to help participant work on and attain dietary goals.   Admit Weight 193 lb 11.2 oz (87.9 kg)   Goal Weight: Short Term 190 lb (86.2 kg)   Goal Weight: Long Term 180 lb (81.6 kg)   Expected Outcomes Short Term: Continue to assess and modify interventions until short term weight is achieved;Long Term: Adherence to nutrition and physical activity/exercise program aimed toward attainment of established weight goal;Weight Loss: Understanding of general recommendations for a balanced deficit meal plan, which promotes 1-2 lb weight loss per week and includes a negative energy balance of [phone] kcal/d;Understanding recommendations for meals to include 15-35% energy as protein, 25-35% energy from fat, 35-60% energy from carbohydrates, less than 253m of dietary cholesterol, 20-35 gm of total fiber daily;Understanding of distribution of calorie intake throughout the day with the  consumption of 4-5 meals/snacks   Increase Strength and Stamina Yes   Intervention Provide advice, education, support and counseling about physical activity/exercise needs.;Develop an individualized exercise prescription for aerobic and resistive training based on initial evaluation findings, risk stratification, comorbidities and participant's personal goals.   Expected Outcomes Achievement of increased cardiorespiratory fitness and enhanced flexibility, muscular endurance and strength shown through measurements of functional capacity and personal statement of participant.   Diabetes Yes  Diet and exercise controlled   Intervention Provide education about signs/symptoms and action to take for hypo/hyperglycemia.;Provide education about proper nutrition, including hydration, and aerobic/resistive exercise prescription along with prescribed medications to achieve blood glucose in normal ranges: Fasting glucose 65-99 mg/dL   Expected Outcomes Short Term: Participant verbalizes understanding of the signs/symptoms and immediate care of hyper/hypoglycemia, proper foot care and importance of medication, aerobic/resistive exercise and nutrition plan for blood glucose control.;Long Term: Attainment of HbA1C < 7%.   Hypertension Yes   Intervention Provide education on lifestyle modifcations including regular physical activity/exercise, weight management, moderate sodium restriction and increased consumption of fresh fruit,  vegetables, and low fat dairy, alcohol moderation, and smoking cessation.;Monitor prescription use compliance.   Expected Outcomes Short Term: Continued assessment and intervention until BP is < 140/44m HG in hypertensive participants. < 130/858mHG in hypertensive participants with diabetes, heart failure or chronic kidney disease.;Long Term: Maintenance of blood pressure at goal levels.   Lipids Yes   Intervention Provide education and support for participant on nutrition & aerobic/resistive  exercise along with prescribed medications to achieve LDL <7032mHDL >39m14m Expected Outcomes Short Term: Participant states understanding of desired cholesterol values and is compliant with medications prescribed. Participant is following exercise prescription and nutrition guidelines.;Long Term: Cholesterol controlled with medications as prescribed, with individualized exercise RX and with personalized nutrition plan. Value goals: LDL < 70mg48mL > 40 mg.   Personal Goal Other Yes   Personal Goal Better exercise routine that can be maintained long term   Intervention Provide exercise prescription in class and for at home   Expected Outcomes Able to be independent with exercise and establish routine      Core Components/Risk Factors/Patient Goals Review:      Goals and Risk Factor Review    Row Name 06/07/16 1420             Core Components/Risk Factors/Patient Goals Review   Personal Goals Review Weight Management/Obesity;Sedentary;Increase Strength and Stamina;Hypertension;Lipids;Diabetes       Review Jim iClair Gullingoing well in rehab.  His weight has started to trend down some.  He is walking every day for 35-40 min.  He is not checking his blood pressure at home, but it has been good here.  He is monitor his diabetes with exercise and diet.  He had tried to make improvements with his diet and plans to meet with the dietician next week.  Overall he is feeling better.  No problems with his statins.       Expected Outcomes Jim wClair Gulling continue to come to education and exercise classes to work on risk factor modification.  We will continue to monitor his blod pressures and weight as well.          Core Components/Risk Factors/Patient Goals at Discharge (Final Review):      Goals and Risk Factor Review - 06/07/16 1420      Core Components/Risk Factors/Patient Goals Review   Personal Goals Review Weight Management/Obesity;Sedentary;Increase Strength and Stamina;Hypertension;Lipids;Diabetes    Review Jim iClair Gullingoing well in rehab.  His weight has started to trend down some.  He is walking every day for 35-40 min.  He is not checking his blood pressure at home, but it has been good here.  He is monitor his diabetes with exercise and diet.  He had tried to make improvements with his diet and plans to meet with the dietician next week.  Overall he is feeling better.  No problems with his statins.   Expected Outcomes Jim wClair Gulling continue to come to education and exercise classes to work on risk factor modification.  We will continue to monitor his blod pressures and weight as well.      ITP Comments:     ITP Comments    Row Name 05/21/16 1447 05/30/16 0644 06/27/16 0655       ITP Comments Initial ITP created during medical review. Diagnosis documentation found [facility] in [facility] [id number] 30 day review. Continue with ITP unless changes noted by Medical Director at signature of review. New to program 30 day review completed for Medical Director physician review  and signature. Continue ITP unless changes made by physician.        Comments:

## 2016-06-28 DIAGNOSIS — Z48812 Encounter for surgical aftercare following surgery on the circulatory system: Secondary | ICD-10-CM | POA: Diagnosis not present

## 2016-06-28 DIAGNOSIS — Z955 Presence of coronary angioplasty implant and graft: Secondary | ICD-10-CM

## 2016-06-28 NOTE — Progress Notes (Signed)
Daily Session Note  Patient Details  Name: Paul Choi MRN: [id number] Date of Birth: 29-Oct-1945 Referring Provider:   Flowsheet Row Cardiac Rehab from 05/21/2016 in [facility] Cardiac and Pulmonary Rehab  Referring Provider  Virl Axe MD      Encounter Date: 06/28/2016  Check In:     Session Check In - 06/28/16 0836      Check-In   Location [facility]-Cardiac & Pulmonary Rehab   Staff Present Gerlene Burdock, RN, BSN;Jessica Luan Pulling, MA, ACSM RCEP, Exercise Physiologist;Amanda Oletta Darter, BA, ACSM CEP, Exercise Physiologist   Supervising physician immediately available to respond to emergencies See telemetry face sheet for immediately available ER MD   Medication changes reported     No   Fall or balance concerns reported    No   Warm-up and Cool-down Performed on first and last piece of equipment   Resistance Training Performed Yes   VAD Patient? No     Pain Assessment   Currently in Pain? No/denies   Multiple Pain Sites No         Goals Met:  Independence with exercise equipment Exercise tolerated well No report of cardiac concerns or symptoms Strength training completed today  Goals Unmet:  Not Applicable  Comments: Pt able to follow exercise prescription today without complaint.  Will continue to monitor for progression.    Dr. Emily Filbert is Medical Director for [place] and [facility].

## 2016-07-03 ENCOUNTER — Encounter: Payer: Medicare Other | Admitting: *Deleted

## 2016-07-03 DIAGNOSIS — Z48812 Encounter for surgical aftercare following surgery on the circulatory system: Secondary | ICD-10-CM | POA: Diagnosis not present

## 2016-07-03 DIAGNOSIS — Z955 Presence of coronary angioplasty implant and graft: Secondary | ICD-10-CM

## 2016-07-03 NOTE — Progress Notes (Signed)
Daily Session Note  Patient Details  Name: Paul Choi MRN: [id number] Date of Birth: [birth year] Referring Provider:   Flowsheet Row Cardiac Rehab from 05/21/2016 in [facility] Cardiac and Pulmonary Rehab  Referring Provider  Virl Axe MD      Encounter Date: 07/03/2016  Check In:     Session Check In - 07/03/16 0819      Check-In   Location [facility]-Cardiac & Pulmonary Rehab   Staff Present Alberteen Sam, MA, ACSM RCEP, Exercise Physiologist;Susanne Bice, RN, BSN, CCRP;Laureen Owens Shark, BS, RRT, Respiratory Therapist   Supervising physician immediately available to respond to emergencies See telemetry face sheet for immediately available ER MD   Medication changes reported     No   Fall or balance concerns reported    No   Warm-up and Cool-down Performed on first and last piece of equipment   Resistance Training Performed Yes   VAD Patient? No     Pain Assessment   Currently in Pain? No/denies   Multiple Pain Sites No         Goals Met:  Independence with exercise equipment Exercise tolerated well No report of cardiac concerns or symptoms Strength training completed today  Goals Unmet:  Not Applicable  Comments: Pt able to follow exercise prescription today without complaint.  Will continue to monitor for progression.    Dr. Emily Filbert is Medical Director for [place] and [facility].

## 2016-07-05 ENCOUNTER — Encounter: Payer: Medicare Other | Admitting: *Deleted

## 2016-07-05 DIAGNOSIS — Z955 Presence of coronary angioplasty implant and graft: Secondary | ICD-10-CM

## 2016-07-05 DIAGNOSIS — Z48812 Encounter for surgical aftercare following surgery on the circulatory system: Secondary | ICD-10-CM | POA: Diagnosis not present

## 2016-07-05 NOTE — Progress Notes (Signed)
Daily Session Note  Patient Details  Name: Paul Choi MRN: [id number] Date of Birth: 1945/08/25 Referring Provider:   Flowsheet Row Cardiac Rehab from 05/21/2016 in [facility] Cardiac and Pulmonary Rehab  Referring Provider  Virl Axe MD      Encounter Date: 07/05/2016  Check In:     Session Check In - 07/05/16 0825      Check-In   Location [facility]-Cardiac & Pulmonary Rehab   Staff Present Alberteen Sam, MA, ACSM RCEP, Exercise Physiologist;Amanda Oletta Darter, BA, ACSM CEP, Exercise Physiologist;Carroll Enterkin, RN, BSN;Other   Supervising physician immediately available to respond to emergencies See telemetry face sheet for immediately available ER MD   Medication changes reported     No   Fall or balance concerns reported    No   Warm-up and Cool-down Performed on first and last piece of equipment   Resistance Training Performed Yes   VAD Patient? No     Pain Assessment   Currently in Pain? No/denies   Multiple Pain Sites No         Goals Met:  Independence with exercise equipment Exercise tolerated well No report of cardiac concerns or symptoms Strength training completed today  Goals Unmet:  Not Applicable  Comments: Pt able to follow exercise prescription today without complaint.  Will continue to monitor for progression.    Dr. Emily Filbert is Medical Director for [place] and [facility].

## 2016-07-10 ENCOUNTER — Encounter: Payer: Medicare Other | Admitting: *Deleted

## 2016-07-10 DIAGNOSIS — Z955 Presence of coronary angioplasty implant and graft: Secondary | ICD-10-CM

## 2016-07-10 DIAGNOSIS — Z48812 Encounter for surgical aftercare following surgery on the circulatory system: Secondary | ICD-10-CM | POA: Diagnosis not present

## 2016-07-10 NOTE — Progress Notes (Signed)
Daily Session Note  Patient Details  Name: Paul Choi MRN: [id number] Date of Birth: 28-Jan-1946 Referring Provider:   Flowsheet Row Cardiac Rehab from 05/21/2016 in [facility] Cardiac and Pulmonary Rehab  Referring Provider  Virl Axe MD      Encounter Date: 07/10/2016  Check In:     Session Check In - 07/10/16 0826      Check-In   Location [facility]-Cardiac & Pulmonary Rehab   Staff Present Alberteen Sam, MA, ACSM RCEP, Exercise Physiologist;Susanne Bice, RN, BSN, CCRP;Laureen Owens Shark, BS, RRT, Respiratory Therapist   Supervising physician immediately available to respond to emergencies See telemetry face sheet for immediately available ER MD   Medication changes reported     No   Fall or balance concerns reported    No   Warm-up and Cool-down Performed as group-led instruction   Resistance Training Performed Yes   VAD Patient? No     Pain Assessment   Currently in Pain? No/denies   Multiple Pain Sites No         Goals Met:  Independence with exercise equipment Exercise tolerated well No report of cardiac concerns or symptoms Strength training completed today  Goals Unmet:  Not Applicable  Comments: Pt able to follow exercise prescription today without complaint.  Will continue to monitor for progression.    Dr. Emily Filbert is Medical Director for [place] and [facility].

## 2016-07-17 ENCOUNTER — Encounter: Payer: Medicare Other | Admitting: *Deleted

## 2016-07-17 DIAGNOSIS — Z955 Presence of coronary angioplasty implant and graft: Secondary | ICD-10-CM

## 2016-07-17 DIAGNOSIS — Z48812 Encounter for surgical aftercare following surgery on the circulatory system: Secondary | ICD-10-CM | POA: Diagnosis not present

## 2016-07-17 NOTE — Progress Notes (Signed)
Daily Session Note  Patient Details  Name: Paul Choi MRN: [id number] Date of Birth: 1946-07-19 Referring Provider:   Flowsheet Row Cardiac Rehab from 05/21/2016 in [facility] Cardiac and Pulmonary Rehab  Referring Provider  Virl Axe MD      Encounter Date: 07/17/2016  Check In:     Session Check In - 07/17/16 0839      Check-In   Location [facility]-Cardiac & Pulmonary Rehab   Staff Present Alberteen Sam, MA, ACSM RCEP, Exercise Physiologist;Susanne Bice, RN, BSN, CCRP;Laureen Owens Shark, BS, RRT, Respiratory Therapist   Supervising physician immediately available to respond to emergencies See telemetry face sheet for immediately available ER MD   Medication changes reported     No   Fall or balance concerns reported    No   Warm-up and Cool-down Performed on first and last piece of equipment   Resistance Training Performed Yes   VAD Patient? No     Pain Assessment   Currently in Pain? No/denies         Goals Met:  Independence with exercise equipment Exercise tolerated well No report of cardiac concerns or symptoms Strength training completed today  Goals Unmet:  Not Applicable  Comments: Pt able to follow exercise prescription today without complaint.  Will continue to monitor for progression.    Dr. Emily Filbert is Medical Director for [place] and [facility].

## 2016-07-19 DIAGNOSIS — Z955 Presence of coronary angioplasty implant and graft: Secondary | ICD-10-CM

## 2016-07-19 DIAGNOSIS — Z48812 Encounter for surgical aftercare following surgery on the circulatory system: Secondary | ICD-10-CM | POA: Diagnosis not present

## 2016-07-19 NOTE — Progress Notes (Signed)
Daily Session Note  Patient Details  Name: Paul Choi MRN: [id number] Date of Birth: [birth year] Referring Provider:   Flowsheet Row Cardiac Rehab from 05/21/2016 in [facility] Cardiac and Pulmonary Rehab  Referring Provider  Virl Axe MD      Encounter Date: 07/19/2016  Check In:     Session Check In - 07/19/16 0828      Check-In   Location [facility]-Cardiac & Pulmonary Rehab   Staff Present Alberteen Sam, MA, ACSM RCEP, Exercise Physiologist;Amanda Oletta Darter, [place], ACSM CEP, Exercise Physiologist;Other   Medication changes reported     No   Fall or balance concerns reported    No   Warm-up and Cool-down Performed on first and last piece of equipment   Resistance Training Performed Yes   VAD Patient? No     Pain Assessment   Currently in Pain? No/denies   Multiple Pain Sites No           Exercise Prescription Changes - 07/18/16 1400      Exercise Review   Progression Yes     Response to Exercise   Blood Pressure (Admit) 120/70   Blood Pressure (Exercise) 148/80   Blood Pressure (Exit) 100/60   Heart Rate (Admit) 68 bpm   Heart Rate (Exercise) 108 bpm   Heart Rate (Exit) 89 bpm   Rating of Perceived Exertion (Exercise) 12   Symptoms none   Comments Home Exercise Guidelines given 06/05/16   Duration Progress to 45 minutes of aerobic exercise without signs/symptoms of physical distress   Intensity THRR unchanged     Progression   Progression Continue to progress workloads to maintain intensity without signs/symptoms of physical distress.   Average METs 4.21     Resistance Training   Training Prescription Yes   Weight 6 lbs   Reps 10-12     Interval Training   Interval Training No     Treadmill   MPH 3   Grade 2.5   Minutes 15   METs 4.54     NuStep   Level 4   Minutes 15   METs 3.5     REL-XR   Level 3   Minutes 15   METs 4.6     Home Exercise Plan   Plans to continue exercise at Home  walking, weights, and possibly going to community club  house   Frequency Add 3 additional days to program exercise sessions.      Goals Met:  Independence with exercise equipment Exercise tolerated well No report of cardiac concerns or symptoms Strength training completed today  Goals Unmet:  Not Applicable  Comments: Pt able to follow exercise prescription today without complaint.  Will continue to monitor for progression.     [place] Name 05/21/16 1524 07/19/16 1027       6 Minute Walk   Phase Initial Discharge    Distance 1245 feet 1500 feet    Distance % Change  - 20.5 %  255    Walk Time 6 minutes 6 minutes    # of Rest Breaks 0 0    MPH 2.36 2.84    METS 3 3.44    RPE 9 13    VO2 Peak 10.53 12.03    Symptoms No No    Resting HR 74 bpm 83 bpm    Resting BP 146/74 116/60    Max Ex. HR 114 bpm 104 bpm    Max Ex. BP 130/60 136/64  2 Minute Post BP 126/70  -         Dr. Emily Filbert is Medical Director for [place] and [facility].

## 2016-07-24 ENCOUNTER — Encounter: Payer: Medicare Other | Attending: Cardiovascular Disease | Admitting: Respiratory Therapy

## 2016-07-24 DIAGNOSIS — Z9889 Other specified postprocedural states: Secondary | ICD-10-CM | POA: Diagnosis present

## 2016-07-24 DIAGNOSIS — Z955 Presence of coronary angioplasty implant and graft: Secondary | ICD-10-CM

## 2016-07-24 DIAGNOSIS — Z48812 Encounter for surgical aftercare following surgery on the circulatory system: Secondary | ICD-10-CM | POA: Diagnosis present

## 2016-07-24 NOTE — Progress Notes (Signed)
Daily Session Note  Patient Details  Name: Paul Choi MRN: [id number] Date of Birth: 1945-09-04 Referring Provider:   Flowsheet Row Cardiac Rehab from 05/21/2016 in [facility] Cardiac and Pulmonary Rehab  Referring Provider  Virl Axe MD      Encounter Date: 07/24/2016  Check In:     Session Check In - 07/24/16 0842      Check-In   Location [facility]-Cardiac & Pulmonary Rehab   Staff Present Alberteen Sam, MA, ACSM RCEP, Exercise Physiologist;Susanne Bice, RN, BSN, CCRP;Laureen Owens Shark, BS, RRT, Respiratory Therapist   Supervising physician immediately available to respond to emergencies See telemetry face sheet for immediately available ER MD   Medication changes reported     No   Fall or balance concerns reported    No   Warm-up and Cool-down Performed on first and last piece of equipment   Resistance Training Performed Yes   VAD Patient? No     Pain Assessment   Currently in Pain? No/denies         Goals Met:  Independence with exercise equipment Exercise tolerated well No report of cardiac concerns or symptoms Strength training completed today  Goals Unmet:  Not Applicable  Comments: Pt able to follow exercise prescription today without complaint.  Will continue to monitor for progression.    Dr. Emily Filbert is Medical Director for [place] and [facility].

## 2016-07-25 ENCOUNTER — Encounter: Payer: Self-pay | Admitting: *Deleted

## 2016-07-25 DIAGNOSIS — Z955 Presence of coronary angioplasty implant and graft: Secondary | ICD-10-CM

## 2016-07-25 NOTE — Progress Notes (Signed)
Cardiac Individual Treatment Plan  Patient Details  Name: Paul Choi MRN: [id number] Date of Birth: [birth year] Referring Provider:   Flowsheet Row Cardiac Rehab from 05/21/2016 in [facility] Cardiac and Pulmonary Rehab  Referring Provider  Virl Axe MD      Initial Encounter Date:  Flowsheet Row Cardiac Rehab from 05/21/2016 in [facility] Cardiac and Pulmonary Rehab  Date  05/21/16  Referring Provider  Virl Axe MD      Visit Diagnosis: Status post coronary artery stent placement  Patient's Home Medications on Admission:  Current Outpatient Prescriptions:  .  allopurinol (ZYLOPRIM) 100 MG tablet, Take 200 mg by mouth every evening. , Disp: , Rfl:  .  amLODipine (NORVASC) 5 MG tablet, Take 5 mg by mouth every evening. , Disp: , Rfl:  .  aspirin EC 81 MG tablet, Take 81 mg by mouth daily., Disp: , Rfl:  .  atorvastatin (LIPITOR) 40 MG tablet, Take 1 tablet (40 mg total) by mouth every evening., Disp: , Rfl:  .  cholecalciferol (VITAMIN D) 1000 units tablet, Take 1,000 Units by mouth every evening. , Disp: , Rfl:  .  clopidogrel (PLAVIX) 75 MG tablet, Take 1 tablet (75 mg total) by mouth every evening., Disp: 90 tablet, Rfl: 3 .  colchicine 0.6 MG tablet, Take 0.6 mg by mouth daily as needed (for gout flare ups.). , Disp: , Rfl:  .  docusate sodium (COLACE) 100 MG capsule, Take 200-300 mg by mouth daily as needed for mild constipation., Disp: , Rfl:  .  Fish Oil-Cholecalciferol (FISH OIL + D3 PO), Take 1 capsule by mouth daily. , Disp: , Rfl:  .  losartan (COZAAR) 100 MG tablet, Take 1 tablet (100 mg total) by mouth every evening., Disp: , Rfl:  .  polyethylene glycol (MIRALAX / GLYCOLAX) packet, Take 17 g by mouth daily as needed for mild constipation., Disp: , Rfl:   Past Medical History: Past Medical History:  Diagnosis Date  . Arteriosclerotic cardiovascular disease   . Arthritis 1997  . Coronary artery disease    Cardiac catheterization in August 2003 with occluded left  circumflex. Echo 8/13 with mild segmental LV dysfunction, EF 40%, moderate mitral insufficiency and mild tricuspid insufficiency; aortic valve appears bicuspid  . Diabetes mellitus without complication ([place])    TYPE 2   . ED (erectile dysfunction)   . Gout   . Gout   . Heart disease   . Hyperlipidemia   . Hypertension 1982  . Insomnia   . Personal history of colonic polyps   . Renal insufficiency     Tobacco Use: History  Smoking Status  . Former Smoker  . Packs/day: 1.00  . Years: 25.00  . Quit date: 09/22/1995  Smokeless Tobacco  . Never Used    Labs: Recent Review Flowsheet Data    There is no flowsheet data to display.       Exercise Target Goals:    Exercise Program Goal: Individual exercise prescription set with THRR, safety & activity barriers. Participant demonstrates ability to understand and report RPE using BORG scale, to self-measure pulse accurately, and to acknowledge the importance of the exercise prescription.  Exercise Prescription Goal: Starting with aerobic activity 30 plus minutes a day, 3 days per week for initial exercise prescription. Provide home exercise prescription and guidelines that participant acknowledges understanding prior to discharge.  Activity Barriers & Risk Stratification:     Activity Barriers & Cardiac Risk Stratification - 05/21/16 1440      Activity Barriers &  Cardiac Risk Stratification   Activity Barriers Other (comment);Balance Concerns  Gout, no flare in quite awhile, previous knee surgery   Cardiac Risk Stratification High      6 Minute Walk:     6 Minute Walk    Row Name 05/21/16 1524 07/19/16 1027       6 Minute Walk   Phase Initial Discharge    Distance 1245 feet 1500 feet    Distance % Change  - 20.5 %  255    Walk Time 6 minutes 6 minutes    # of Rest Breaks 0 0    MPH 2.36 2.84    METS 3 3.44    RPE 9 13    VO2 Peak 10.53 12.03    Symptoms No No    Resting HR 74 bpm 83 bpm    Resting BP 146/74  116/60    Max Ex. HR 114 bpm 104 bpm    Max Ex. BP 130/60 136/64    2 Minute Post BP 126/70  -       Initial Exercise Prescription:     Initial Exercise Prescription - 05/21/16 1500      Date of Initial Exercise RX and Referring Provider   Date 05/21/16   Referring Provider Virl Axe MD     Treadmill   MPH 2   Grade 1.5   Minutes 15   METs 2.94     NuStep   Level 3   Watts --  80-100 spm   Minutes 15   METs 2     REL-XR   Level 3   Watts --  speed 50 rpm   Minutes 15   METs 3     Prescription Details   Frequency (times per week) 2   Duration Progress to 45 minutes of aerobic exercise without signs/symptoms of physical distress     Intensity   THRR 40-80% of Max Heartrate 104-133   Ratings of Perceived Exertion 11-13   Perceived Dyspnea 0-4     Progression   Progression Continue to progress workloads to maintain intensity without signs/symptoms of physical distress.     Resistance Training   Training Prescription Yes   Weight 3 lbs   Reps 10-12      Perform Capillary Blood Glucose checks as needed.  Exercise Prescription Changes:     Exercise Prescription Changes    Row Name 05/21/16 1500 06/05/16 1400 06/20/16 0900 07/03/16 1500 07/18/16 1400     Exercise Review   Progression -  walk test results Yes Yes Yes Yes     Response to Exercise   Blood Pressure (Admit) 146/74 134/70 148/70 114/70 120/70   Blood Pressure (Exercise) 130/60 126/70 160/84 160/70 148/80   Blood Pressure (Exit) 126/70 126/70 148/70 110/60 100/60   Heart Rate (Admit) 74 bpm 60 bpm 72 bpm 67 bpm 68 bpm   Heart Rate (Exercise) 114 bpm 127 bpm 107 bpm 116 bpm 108 bpm   Heart Rate (Exit) 75 bpm 73 bpm 83 bpm 81 bpm 89 bpm   Oxygen Saturation (Admit) 100 %  -  -  -  -   Oxygen Saturation (Exit) 100 %  -  -  -  -   Rating of Perceived Exertion (Exercise) _0 Symptoms _1    Comments  - Home Exercise Guidelines given 06/05/16 Home Exercise  Guidelines given 06/05/16 Home Exercise Guidelines given 06/05/16 Home Exercise Guidelines given 06/05/16  Duration  - Progress to 45 minutes of aerobic exercise without signs/symptoms of physical distress Progress to 45 minutes of aerobic exercise without signs/symptoms of physical distress Progress to 45 minutes of aerobic exercise without signs/symptoms of physical distress Progress to 45 minutes of aerobic exercise without signs/symptoms of physical distress   Intensity  - THRR unchanged THRR unchanged THRR unchanged THRR unchanged     Progression   Progression  - Continue to progress workloads to maintain intensity without signs/symptoms of physical distress. Continue to progress workloads to maintain intensity without signs/symptoms of physical distress. Continue to progress workloads to maintain intensity without signs/symptoms of physical distress. Continue to progress workloads to maintain intensity without signs/symptoms of physical distress.   Average METs  - 3 3.55 4.65 4.21     Resistance Training   Training Prescription  - Yes Yes Yes Yes   Weight  - 6 lbs 6 lbs 6 lbs 6 lbs   Reps  - 10-12 10-12 10-12 10-12     Interval Training   Interval Training  - No No No No     Treadmill   MPH  - 2.3 2._0 Grade  - 2 2.5 2.5 2.5   Minutes  - _1 METs  - 3.4 3.95 4.54 4.54     NuStep   Level  - _2 Minutes  - _3 METs  - 2.7 2.7 3.5 3.5     REL-XR   Level  - _4 Minutes  - _5 METs  - 3.9 4 3.7 4.6     Home Exercise Plan   Plans to continue exercise at  - Home  walking, weights, and possibly going to community club house Home  walking, weights, and possibly going to [place]  walking, weights, and possibly going to [place]  walking, weights, and possibly going to community club house   Frequency  - Add 3 additional days to program exercise sessions. Add 3 additional days to program exercise  sessions. Add 3 additional days to program exercise sessions. Add 3 additional days to program exercise sessions.      Exercise Comments:     Exercise Comments    Row Name 05/21/16 1527 06/05/16 1057 06/05/16 1437 06/20/16 0928 07/03/16 1544   Exercise Comments Exercise goals are to lose weight and get into a better exercise routine that he can maintain long term. Reviewed home exercise with pt today.  Pt plans to walk and go to clubhouse in neighborhood for exercise.  Reviewed THR, pulse, RPE, sign and symptoms, and when to call 911 or MD.  Also discussed weather considerations and indoor options.  Pt voiced understanding. Clair Gulling is off to a good start with his exercise.  He is already doing level 4 on the NuStep.  We will continue to monitor for progression. Jim continunes to do well in rehab.  He is working hard and getting 4 METs on the XR.  We will continue to monitor his progress. Clair Gulling has been doing well in rehab.  He is now up to 3 mph on the treadmill. We will continue to monitor for progression.   [place] Name 07/18/16 1454 07/19/16 1030 07/19/16 1105       Exercise Comments Clair Gulling will be graduating soon.  He continues to make improvements.  We will continue to  monitor his progression. Clair Gulling improved his walk test by 20.5%! Reviewed METs and progression today.        Discharge Exercise Prescription (Final Exercise Prescription Changes):     Exercise Prescription Changes - 07/18/16 1400      Exercise Review   Progression Yes     Response to Exercise   Blood Pressure (Admit) 120/70   Blood Pressure (Exercise) 148/80   Blood Pressure (Exit) 100/60   Heart Rate (Admit) 68 bpm   Heart Rate (Exercise) 108 bpm   Heart Rate (Exit) 89 bpm   Rating of Perceived Exertion (Exercise) 12   Symptoms none   Comments Home Exercise Guidelines given 06/05/16   Duration Progress to 45 minutes of aerobic exercise without signs/symptoms of physical distress   Intensity THRR unchanged     Progression    Progression Continue to progress workloads to maintain intensity without signs/symptoms of physical distress.   Average METs 4.21     Resistance Training   Training Prescription Yes   Weight 6 lbs   Reps 10-12     Interval Training   Interval Training No     Treadmill   MPH 3   Grade 2.5   Minutes 15   METs 4.54     NuStep   Level 4   Minutes 15   METs 3.5     REL-XR   Level 3   Minutes 15   METs 4.6     Home Exercise Plan   Plans to continue exercise at Home  walking, weights, and possibly going to community club house   Frequency Add 3 additional days to program exercise sessions.      Nutrition:  Target Goals: Understanding of nutrition guidelines, daily intake of sodium <1567m, cholesterol <2021m calories 30% from fat and 7% or less from saturated fats, daily to have 5 or more servings of fruits and vegetables.  Biometrics:     Pre Biometrics - 05/21/16 1530      Pre Biometrics   Height 6' 0.4" (1.839 m)   Weight 193 lb 11.2 oz (87.9 kg)   Waist Circumference 39 inches   Hip Circumference 35 inches   Waist to Hip Ratio 1.11 %   BMI (Calculated) 26   Single Leg Stand 1.38 seconds       Nutrition Therapy Plan and Nutrition Goals:     Nutrition Therapy & Goals - 07/10/16 0924      Personal Nutrition Goals   Personal Goal #1 Control food portions at dinner meal -- especially starches, maybe meats. Eat pleny of low-carb vegetables.   Personal Goal #2 Add a snack/ lite meal at about noon, and another at about 8-9pm. (for blood sugar control and better appetite control at main meal)      Nutrition Discharge: Rate Your Plate Scores:     Nutrition Assessments - 07/17/16 1122      Rate Your Plate Scores   Pre Score 48   Pre Score % 55 %   Post Score 69   Post Score % 79.3 %   % Change 24.3 %      Nutrition Goals Re-Evaluation:     Nutrition Goals Re-Evaluation    Row Name 07/10/16 0936             Personal Goal #1 Re-Evaluation    Personal Goal #1 Control food portions at dinner meal -- especially starches, maybe meats. Eat pleny of low-carb vegetables.       Goal Progress  Seen Yes       Comments HAs been working on portion sizes and  has seen positive changes in his weight change.  Expected outcome: Continued weight control and continued progress in meal planning         Personal Goal #2 Re-Evaluation   Personal Goal #2 Add a snack/ lite meal at about noon, and another at about 8-9pm. (for blood sugar control and better appetite control at main meal)       Goal Progress Seen Yes       Comments Has added the lite snack at noon and bedtime and states is doing well with this change in nutrition.  Expected outcome: Continue with advised meals plan          Psychosocial: Target Goals: Acknowledge presence or absence of depression, maximize coping skills, provide positive support system. Participant is able to verbalize types and ability to use techniques and skills needed for reducing stress and depression.  Initial Review & Psychosocial Screening:     Initial Psych Review & Screening - 05/21/16 1452      Initial Review   Current issues with Current Sleep Concerns  Will use OTC to help with sleep occasionally      Quality of Life Scores:     Quality of Life - 07/17/16 1122      Quality of Life Scores   Health/Function Pre 21 %   Health/Function Post 29.57 %   Health/Function % Change 40.81 %   Socioeconomic Pre 20.29 %   Socioeconomic Post 29.64 %   Socioeconomic % Change  46.08 %   Psych/Spiritual Pre 20.86 %   Psych/Spiritual Post 30 %   Psych/Spiritual % Change 43.82 %   Family Pre 21 %   Family Post 30 %   Family % Change 42.86 %   GLOBAL Pre 20.82 %   GLOBAL Post 29.74 %   GLOBAL % Change 42.84 %      PHQ-9: Recent Review Flowsheet Data    Depression screen [facility] 2/9 07/17/2016 05/21/2016   Decreased Interest 0 0   Down, Depressed, Hopeless 0 0   PHQ - 2 Score 0 0   Altered sleeping 0 0    Tired, decreased energy 0 0   Change in appetite 0 2   Feeling bad or failure about yourself  0 0   Trouble concentrating 0 0   Moving slowly or fidgety/restless 0 0   Suicidal thoughts 0 0   PHQ-9 Score 0 2   Difficult doing work/chores Not difficult at all Not difficult at all      Psychosocial Evaluation and Intervention:     Psychosocial Evaluation - 06/05/16 0932      Psychosocial Evaluation & Interventions   Comments Counselor met with Mr. Palmeri (Mr. D) today for initial psychosocial evaluation.  He is a 70 year old who had a stent inserted in September.  He has a strong support system with a spouse of 39 years and (3) adult children who live close by.  Mr. D also lives in a retirement community and is actively involved in his local church.  He also has borderline diabetes that is being treated with exercise and diet currently and he also has a liver disease currently.  He reports sleeping well with an occasional OTC sleep aid PRN; and his appetite is good.  He denies a history or current symptoms of depression or anxiety and states he is typically in a positive mood most of  the time.  Mr. D states he has minimal stress other than "worrying" about his (67) grandchildren ages 74-26 occasionally.  His goals for this program are to drop some weight and to increase his stamina and strength.        Psychosocial Re-Evaluation:     Psychosocial Re-Evaluation    [place] Name 07/10/16 0935             Psychosocial Re-Evaluation   Comments JIm continues to stay active daily at home and with his family. No voiced concerns this visit.       Continued Psychosocial Services Needed No          Vocational Rehabilitation: Provide vocational rehab assistance to qualifying candidates.   Vocational Rehab Evaluation & Intervention:     Vocational Rehab - 05/21/16 1441      Initial Vocational Rehab Evaluation & Intervention   Assessment shows need for Vocational Rehabilitation No       Education: Education Goals: Education classes will be provided on a weekly basis, covering required topics. Participant will state understanding/return demonstration of topics presented.  Learning Barriers/Preferences:     Learning Barriers/Preferences - 05/21/16 1440      Learning Barriers/Preferences   Learning Barriers None   Learning Preferences None      Education Topics: General Nutrition Guidelines/Fats and Fiber: -Group instruction provided by verbal, written material, models and posters to present the general guidelines for heart healthy nutrition. Gives an explanation and review of dietary fats and fiber. Flowsheet Row Cardiac Rehab from 07/24/2016 in [facility] Cardiac and Pulmonary Rehab  Date  07/03/16  Educator  SB  Instruction Review Code  2- meets goals/outcomes      Controlling Sodium/Reading Food Labels: -Group verbal and written material supporting the discussion of sodium use in heart healthy nutrition. Review and explanation with models, verbal and written materials for utilization of the food label. Flowsheet Row Cardiac Rehab from 07/24/2016 in [facility] Cardiac and Pulmonary Rehab  Date  07/10/16  Educator  PI  Instruction Review Code  2- meets goals/outcomes      Exercise Physiology & Risk Factors: - Group verbal and written instruction with models to review the exercise physiology of the cardiovascular system and associated critical values. Details cardiovascular disease risk factors and the goals associated with each risk factor. Flowsheet Row Cardiac Rehab from 07/24/2016 in [facility] Cardiac and Pulmonary Rehab  Date  06/28/16  Educator  [facility]  Instruction Review Code  2- meets goals/outcomes      Aerobic Exercise & Resistance Training: - Gives group verbal and written discussion on the health impact of inactivity. On the components of aerobic and resistive training programs and the benefits of this training and how to safely progress through these  programs. Flowsheet Row Cardiac Rehab from 07/24/2016 in [facility] Cardiac and Pulmonary Rehab  Date  07/17/16  Educator  [facility]  Instruction Review Code  2- meets goals/outcomes      Flexibility, Balance, General Exercise Guidelines: - Provides group verbal and written instruction on the benefits of flexibility and balance training programs. Provides general exercise guidelines with specific guidelines to those with heart or lung disease. Demonstration and skill practice provided. Flowsheet Row Cardiac Rehab from 07/24/2016 in [facility] Cardiac and Pulmonary Rehab  Date  07/19/16  Educator  [facility]  Instruction Review Code  R- Review/reinforce [Second Class]      Stress Management: - Provides group verbal and written instruction about the health risks of elevated stress, cause of high stress, and  healthy ways to reduce stress. Flowsheet Row Cardiac Rehab from 07/24/2016 in [facility] Cardiac and Pulmonary Rehab  Date  06/07/16  Educator  SB  Instruction Review Code  2- meets goals/outcomes      Depression: - Provides group verbal and written instruction on the correlation between heart/lung disease and depressed mood, treatment options, and the stigmas associated with seeking treatment. Flowsheet Row Cardiac Rehab from 07/24/2016 in [facility] Cardiac and Pulmonary Rehab  Date  07/05/16  Educator  CE  Instruction Review Code  2- meets goals/outcomes      Anatomy & Physiology of the Heart: - Group verbal and written instruction and models provide basic cardiac anatomy and physiology, with the coronary electrical and arterial systems. Review of: AMI, Angina, Valve disease, Heart Failure, Cardiac Arrhythmia, Pacemakers, and the ICD. Flowsheet Row Cardiac Rehab from 07/24/2016 in [facility] Cardiac and Pulmonary Rehab  Date  07/24/16  Educator  SB  Instruction Review Code  2- meets goals/outcomes      Cardiac Procedures: - Group verbal and written instruction and models to describe the testing methods done to  diagnose heart disease. Reviews the outcomes of the test results. Describes the treatment choices: Medical Management, Angioplasty, or Coronary Bypass Surgery. Flowsheet Row Cardiac Rehab from 07/24/2016 in [facility] Cardiac and Pulmonary Rehab  Date  06/12/16  Educator  SB  Instruction Review Code  2- meets goals/outcomes      Cardiac Medications: - Group verbal and written instruction to review commonly prescribed medications for heart disease. Reviews the medication, class of the drug, and side effects. Includes the steps to properly store meds and maintain the prescription regimen. Flowsheet Row Cardiac Rehab from 07/24/2016 in [facility] Cardiac and Pulmonary Rehab  Date  06/14/16 [10/31 Part 2]  Educator  C. EnterkinRN  Instruction Review Code  2- meets goals/outcomes      Go Sex-Intimacy & Heart Disease, Get SMART - Goal Setting: - Group verbal and written instruction through game format to discuss heart disease and the return to sexual intimacy. Provides group verbal and written material to discuss and apply goal setting through the application of the S.M.A.R.T. Method. Flowsheet Row Cardiac Rehab from 07/24/2016 in [facility] Cardiac and Pulmonary Rehab  Date  06/12/16  Educator  SB  Instruction Review Code  2- meets goals/outcomes      Other Matters of the Heart: - Provides group verbal, written materials and models to describe Heart Failure, Angina, Valve Disease, and Diabetes in the realm of heart disease. Includes description of the disease process and treatment options available to the cardiac patient. Flowsheet Row Cardiac Rehab from 07/24/2016 in [facility] Cardiac and Pulmonary Rehab  Date  06/05/16  Educator  SB  Instruction Review Code  2- meets goals/outcomes      Exercise & Equipment Safety: - Individual verbal instruction and demonstration of equipment use and safety with use of the equipment. Flowsheet Row Cardiac Rehab from 07/24/2016 in [facility] Cardiac and Pulmonary Rehab  Date   05/21/16  Educator  SB  Instruction Review Code  2- meets goals/outcomes      Infection Prevention: - Provides verbal and written material to individual with discussion of infection control including proper hand washing and proper equipment cleaning during exercise session. Flowsheet Row Cardiac Rehab from 07/24/2016 in [facility] Cardiac and Pulmonary Rehab  Date  05/21/16  Educator  SB  Instruction Review Code  2- meets goals/outcomes      Falls Prevention: - Provides verbal and written material to individual with discussion of  falls prevention and safety.   Diabetes: - Individual verbal and written instruction to review signs/symptoms of diabetes, desired ranges of glucose level fasting, after meals and with exercise. Advice that pre and post exercise glucose checks will be done for 3 sessions at entry of program. Flowsheet Row Cardiac Rehab from 07/24/2016 in [facility] Cardiac and Pulmonary Rehab  Date  05/21/16  Educator  SB  Instruction Review Code  2- meets goals/outcomes       Knowledge Questionnaire Score:     Knowledge Questionnaire Score - 07/17/16 1122      Knowledge Questionnaire Score   Pre Score 22/28   Post Score 27/28      Core Components/Risk Factors/Patient Goals at Admission:     Personal Goals and Risk Factors at Admission - 05/21/16 1443      Core Components/Risk Factors/Patient Goals on Admission    Weight Management Yes;Weight Loss   Intervention Weight Management: Develop a combined nutrition and exercise program designed to reach desired caloric intake, while maintaining appropriate intake of nutrient and fiber, sodium and fats, and appropriate energy expenditure required for the weight goal.;Weight Management: Provide education and appropriate resources to help participant work on and attain dietary goals.   Admit Weight 193 lb 11.2 oz (87.9 kg)   Goal Weight: Short Term 190 lb (86.2 kg)   Goal Weight: Long Term 180 lb (81.6 kg)   Expected Outcomes  Short Term: Continue to assess and modify interventions until short term weight is achieved;Long Term: Adherence to nutrition and physical activity/exercise program aimed toward attainment of established weight goal;Weight Loss: Understanding of general recommendations for a balanced deficit meal plan, which promotes 1-2 lb weight loss per week and includes a negative energy balance of [phone] kcal/d;Understanding recommendations for meals to include 15-35% energy as protein, 25-35% energy from fat, 35-60% energy from carbohydrates, less than 247m of dietary cholesterol, 20-35 gm of total fiber daily;Understanding of distribution of calorie intake throughout the day with the consumption of 4-5 meals/snacks   Increase Strength and Stamina Yes   Intervention Provide advice, education, support and counseling about physical activity/exercise needs.;Develop an individualized exercise prescription for aerobic and resistive training based on initial evaluation findings, risk stratification, comorbidities and participant's personal goals.   Expected Outcomes Achievement of increased cardiorespiratory fitness and enhanced flexibility, muscular endurance and strength shown through measurements of functional capacity and personal statement of participant.   Diabetes Yes  Diet and exercise controlled   Intervention Provide education about signs/symptoms and action to take for hypo/hyperglycemia.;Provide education about proper nutrition, including hydration, and aerobic/resistive exercise prescription along with prescribed medications to achieve blood glucose in normal ranges: Fasting glucose 65-99 mg/dL   Expected Outcomes Short Term: Participant verbalizes understanding of the signs/symptoms and immediate care of hyper/hypoglycemia, proper foot care and importance of medication, aerobic/resistive exercise and nutrition plan for blood glucose control.;Long Term: Attainment of HbA1C < 7%.   Hypertension Yes    Intervention Provide education on lifestyle modifcations including regular physical activity/exercise, weight management, moderate sodium restriction and increased consumption of fresh fruit, vegetables, and low fat dairy, alcohol moderation, and smoking cessation.;Monitor prescription use compliance.   Expected Outcomes Short Term: Continued assessment and intervention until BP is < 140/962mHG in hypertensive participants. < 130/8016mG in hypertensive participants with diabetes, heart failure or chronic kidney disease.;Long Term: Maintenance of blood pressure at goal levels.   Lipids Yes   Intervention Provide education and support for participant on nutrition & aerobic/resistive exercise along with prescribed  medications to achieve LDL <88m, HDL >432m   Expected Outcomes Short Term: Participant states understanding of desired cholesterol values and is compliant with medications prescribed. Participant is following exercise prescription and nutrition guidelines.;Long Term: Cholesterol controlled with medications as prescribed, with individualized exercise RX and with personalized nutrition plan. Value goals: LDL < 7028mHDL > 40 mg.   Personal Goal Other Yes   Personal Goal Better exercise routine that can be maintained long term   Intervention Provide exercise prescription in class and for at home   Expected Outcomes Able to be independent with exercise and establish routine      Core Components/Risk Factors/Patient Goals Review:      Goals and Risk Factor Review    Row Name 06/07/16 1420 07/10/16 0931           Core Components/Risk Factors/Patient Goals Review   Personal Goals Review Weight Management/Obesity;Sedentary;Increase Strength and Stamina;Hypertension;Lipids;Diabetes Weight Management/Obesity;Hypertension;Lipids;Diabetes      Review JimClair Gulling doing well in rehab.  His weight has started to trend down some.  He is walking every day for 35-40 min.  He is not checking his blood  pressure at home, but it has been good here.  He is monitor his diabetes with exercise and diet.  He had tried to make improvements with his diet and plans to meet with the dietician next week.  Overall he is feeling better.  No problems with his statins. JIm continues to do well working on Risk Factor goals.  He has reached his short term weight goal and per the RD he should reset his long term goal to 188 lbs. JinAlbertine Gratestributes his progress to working with the nutrition goals, adding the home exercise and taking his meds to his progress.       Expected Outcomes JimClair Gullingll continue to come to education and exercise classes to work on risk factor modification.  We will continue to monitor his blod pressures and weight as well. JIm will continue to maintain his new weight, BP and cholsesterol and blood sugar control with his activiity, exercise, nutrtion goals and meds as ordered         Core Components/Risk Factors/Patient Goals at Discharge (Final Review):      Goals and Risk Factor Review - 07/10/16 0931      Core Components/Risk Factors/Patient Goals Review   Personal Goals Review Weight Management/Obesity;Hypertension;Lipids;Diabetes   Review JIm continues to do well working on Risk Factor goals.  He has reached his short term weight goal and per the RD he should reset his long term goal to 188 lbs. JinAlbertine Gratestributes his progress to working with the nutrition goals, adding the home exercise and taking his meds to his progress.    Expected Outcomes JIm will continue to maintain his new weight, BP and cholsesterol and blood sugar control with his activiity, exercise, nutrtion goals and meds as ordered      ITP Comments:     ITP Comments    Row Name 05/21/16 1447 05/30/16 0644 06/27/16 0655 07/05/16 0945 07/25/16 0554   ITP Comments Initial ITP created during medical review. Diagnosis documentation found [facility] 30 day review. Continue with ITP unless changes noted by  Medical Director at signature of review. New to program 30 day review completed for Medical Director physician review and signature. Continue ITP unless changes made by physician. [place] PVC's today while exercsing on the treadmill at 3.0mp43mJim Clair Gullingd he felt short of breath when he was  having those irregular beats. When we had him stop on the treadmill he went back into sinus rhythm. Given a banana to eat since he said he did not have any caffeine and his sleep was good last night. Given 240cc water also.  30 day review completed for review by Dr Emily Filbert.  Continue with ITP unless changes noted by Dr Sabra Heck.      Comments:

## 2016-07-26 DIAGNOSIS — Z955 Presence of coronary angioplasty implant and graft: Secondary | ICD-10-CM

## 2016-07-26 DIAGNOSIS — Z48812 Encounter for surgical aftercare following surgery on the circulatory system: Secondary | ICD-10-CM | POA: Diagnosis not present

## 2016-07-26 NOTE — Progress Notes (Signed)
Daily Session Note  Patient Details  Name: Paul Choi MRN: [id number] Date of Birth: 1946-06-23 Referring Provider:   Flowsheet Row Cardiac Rehab from 05/21/2016 in [facility]  Referring Provider  Virl Axe MD      Encounter Date: 07/26/2016  Check In:     Session Check In - 07/26/16 0901      Check-In   Location [facility]-Cardiac & Pulmonary Rehab   Staff Present Alberteen Sam, MA, ACSM RCEP, Exercise Physiologist;Gladyce Mcray Oletta Darter, BA, ACSM CEP, Exercise Physiologist;Other  Jena Gauss, RN   Supervising physician immediately available to respond to emergencies See telemetry face sheet for immediately available ER MD   Medication changes reported     No   Fall or balance concerns reported    No   Warm-up and Cool-down Performed on first and last piece of equipment   Resistance Training Performed Yes   VAD Patient? No     Pain Assessment   Currently in Pain? No/denies   Multiple Pain Sites No         Goals Met:  Proper associated with RPD/PD & O2 Sat Independence with exercise equipment Exercise tolerated well Strength training completed today  Goals Unmet:  Not Applicable  Comments: Pt able to follow exercise prescription today without complaint.  Will continue to monitor for progression.    Dr. Emily Filbert is Medical Director for [facility].

## 2016-07-27 NOTE — Patient Instructions (Signed)
Discharge Instructions  Patient Details  Name: Paul Choi MRN: [id number] Date of Birth: Oct 25, 1945 Referring Provider:  Sherren Mocha, MD   Number of Visits: 36  Reason for Discharge:  Patient reached a stable level of exercise. Patient independent in their exercise.  Smoking History:  History  Smoking Status  . Former Smoker  . Packs/day: 1.00  . Years: 25.00  . Quit date: 09/22/1995  Smokeless Tobacco  . Never Used    Diagnosis:  Status post coronary artery stent placement  Initial Exercise Prescription:     Initial Exercise Prescription - 05/21/16 1500      Date of Initial Exercise RX and Referring Provider   Date 05/21/16   Referring Provider Virl Axe MD     Treadmill   MPH 2   Grade 1.5   Minutes 15   METs 2.94     NuStep   Level 3   Watts --  80-100 spm   Minutes 15   METs 2     REL-XR   Level 3   Watts --  speed 50 rpm   Minutes 15   METs 3     Prescription Details   Frequency (times per week) 2   Duration Progress to 45 minutes of aerobic exercise without signs/symptoms of physical distress     Intensity   THRR 40-80% of Max Heartrate 104-133   Ratings of Perceived Exertion 11-13   Perceived Dyspnea 0-4     Progression   Progression Continue to progress workloads to maintain intensity without signs/symptoms of physical distress.     Resistance Training   Training Prescription Yes   Weight 3 lbs   Reps 10-12      Discharge Exercise Prescription (Final Exercise Prescription Changes):     Exercise Prescription Changes - 07/18/16 1400      Exercise Review   Progression Yes     Response to Exercise   Blood Pressure (Admit) 120/70   Blood Pressure (Exercise) 148/80   Blood Pressure (Exit) 100/60   Heart Rate (Admit) 68 bpm   Heart Rate (Exercise) 108 bpm   Heart Rate (Exit) 89 bpm   Rating of Perceived Exertion (Exercise) 12   Symptoms none   Comments Home Exercise Guidelines given 06/05/16   Duration Progress  to 45 minutes of aerobic exercise without signs/symptoms of physical distress   Intensity THRR unchanged     Progression   Progression Continue to progress workloads to maintain intensity without signs/symptoms of physical distress.   Average METs 4.21     Resistance Training   Training Prescription Yes   Weight 6 lbs   Reps 10-12     Interval Training   Interval Training No     Treadmill   MPH 3   Grade 2.5   Minutes 15   METs 4.54     NuStep   Level 4   Minutes 15   METs 3.5     REL-XR   Level 3   Minutes 15   METs 4.6     Home Exercise Plan   Plans to continue exercise at Home  walking, weights, and possibly going to community club house   Frequency Add 3 additional days to program exercise sessions.      Functional Capacity:     6 Minute Walk    Row Name 05/21/16 1524 07/19/16 1027       6 Minute Walk   Phase Initial Discharge    Distance 1245 feet 1500  feet    Distance % Change  - 20.5 %  255    Walk Time 6 minutes 6 minutes    # of Rest Breaks 0 0    MPH 2.36 2.84    METS 3 3.44    RPE 9 13    VO2 Peak 10.53 12.03    Symptoms No No    Resting HR 74 bpm 83 bpm    Resting BP 146/74 116/60    Max Ex. HR 114 bpm 104 bpm    Max Ex. BP 130/60 136/64    2 Minute Post BP 126/70  -       Quality of Life:     Quality of Life - 07/17/16 1122      Quality of Life Scores   Health/Function Pre 21 %   Health/Function Post 29.57 %   Health/Function % Change 40.81 %   Socioeconomic Pre 20.29 %   Socioeconomic Post 29.64 %   Socioeconomic % Change  46.08 %   Psych/Spiritual Pre 20.86 %   Psych/Spiritual Post 30 %   Psych/Spiritual % Change 43.82 %   Family Pre 21 %   Family Post 30 %   Family % Change 42.86 %   GLOBAL Pre 20.82 %   GLOBAL Post 29.74 %   GLOBAL % Change 42.84 %      Personal Goals: Goals established at orientation with interventions provided to work toward goal.     Personal Goals and Risk Factors at Admission - 05/21/16  1443      Core Components/Risk Factors/Patient Goals on Admission    Weight Management Yes;Weight Loss   Intervention Weight Management: Develop a combined nutrition and exercise program designed to reach desired caloric intake, while maintaining appropriate intake of nutrient and fiber, sodium and fats, and appropriate energy expenditure required for the weight goal.;Weight Management: Provide education and appropriate resources to help participant work on and attain dietary goals.   Admit Weight 193 lb 11.2 oz (87.9 kg)   Goal Weight: Short Term 190 lb (86.2 kg)   Goal Weight: Long Term 180 lb (81.6 kg)   Expected Outcomes Short Term: Continue to assess and modify interventions until short term weight is achieved;Long Term: Adherence to nutrition and physical activity/exercise program aimed toward attainment of established weight goal;Weight Loss: Understanding of general recommendations for a balanced deficit meal plan, which promotes 1-2 lb weight loss per week and includes a negative energy balance of [phone] kcal/d;Understanding recommendations for meals to include 15-35% energy as protein, 25-35% energy from fat, 35-60% energy from carbohydrates, less than 200mg  of dietary cholesterol, 20-35 gm of total fiber daily;Understanding of distribution of calorie intake throughout the day with the consumption of 4-5 meals/snacks   Increase Strength and Stamina Yes   Intervention Provide advice, education, support and counseling about physical activity/exercise needs.;Develop an individualized exercise prescription for aerobic and resistive training based on initial evaluation findings, risk stratification, comorbidities and participant's personal goals.   Expected Outcomes Achievement of increased cardiorespiratory fitness and enhanced flexibility, muscular endurance and strength shown through measurements of functional capacity and personal statement of participant.   Diabetes Yes  Diet and exercise  controlled   Intervention Provide education about signs/symptoms and action to take for hypo/hyperglycemia.;Provide education about proper nutrition, including hydration, and aerobic/resistive exercise prescription along with prescribed medications to achieve blood glucose in normal ranges: Fasting glucose 65-99 mg/dL   Expected Outcomes Short Term: Participant verbalizes understanding of the signs/symptoms and immediate care of  hyper/hypoglycemia, proper foot care and importance of medication, aerobic/resistive exercise and nutrition plan for blood glucose control.;Long Term: Attainment of HbA1C < 7%.   Hypertension Yes   Intervention Provide education on lifestyle modifcations including regular physical activity/exercise, weight management, moderate sodium restriction and increased consumption of fresh fruit, vegetables, and low fat dairy, alcohol moderation, and smoking cessation.;Monitor prescription use compliance.   Expected Outcomes Short Term: Continued assessment and intervention until BP is < 140/64mm HG in hypertensive participants. < 130/2mm HG in hypertensive participants with diabetes, heart failure or chronic kidney disease.;Long Term: Maintenance of blood pressure at goal levels.   Lipids Yes   Intervention Provide education and support for participant on nutrition & aerobic/resistive exercise along with prescribed medications to achieve LDL 70mg , HDL >40mg .   Expected Outcomes Short Term: Participant states understanding of desired cholesterol values and is compliant with medications prescribed. Participant is following exercise prescription and nutrition guidelines.;Long Term: Cholesterol controlled with medications as prescribed, with individualized exercise RX and with personalized nutrition plan. Value goals: LDL < 70mg , HDL > 40 mg.   Personal Goal Other Yes   Personal Goal Better exercise routine that can be maintained long term   Intervention Provide exercise prescription in  class and for at home   Expected Outcomes Able to be independent with exercise and establish routine       Personal Goals Discharge:     Goals and Risk Factor Review - 07/10/16 0931      Core Components/Risk Factors/Patient Goals Review   Personal Goals Review Weight Management/Obesity;Hypertension;Lipids;Diabetes   Review JIm continues to do well working on Risk Factor goals.  He has reached his short term weight goal and per the RD he should reset his long term goal to 188 lbs. Albertine Grates attributes his progress to working with the nutrition goals, adding the home exercise and taking his meds to his progress.    Expected Outcomes JIm will continue to maintain his new weight, BP and cholsesterol and blood sugar control with his activiity, exercise, nutrtion goals and meds as ordered      Nutrition & Weight - Outcomes:     Pre Biometrics - 05/21/16 1530      Pre Biometrics   Height 6' 0.4" (1.839 m)   Weight 193 lb 11.2 oz (87.9 kg)   Waist Circumference 39 inches   Hip Circumference 35 inches   Waist to Hip Ratio 1.11 %   BMI (Calculated) 26   Single Leg Stand 1.38 seconds       Nutrition:     Nutrition Therapy & Goals - 07/10/16 0924      Personal Nutrition Goals   Personal Goal #1 Control food portions at dinner meal -- especially starches, maybe meats. Eat pleny of low-carb vegetables.   Personal Goal #2 Add a snack/ lite meal at about noon, and another at about 8-9pm. (for blood sugar control and better appetite control at main meal)      Nutrition Discharge:     Nutrition Assessments - 07/17/16 1122      Rate Your Plate Scores   Pre Score 48   Pre Score % 55 %   Post Score 69   Post Score % 79.3 %   % Change 24.3 %      Education Questionnaire Score:     Knowledge Questionnaire Score - 07/17/16 1122      Knowledge Questionnaire Score   Pre Score 22/28   Post Score 27/28  Goals reviewed with patient; copy given to patient.

## 2016-07-31 ENCOUNTER — Encounter: Payer: Medicare Other | Admitting: Respiratory Therapy

## 2016-07-31 VITALS — Ht 72.4 in | Wt 190.4 lb

## 2016-07-31 DIAGNOSIS — Z48812 Encounter for surgical aftercare following surgery on the circulatory system: Secondary | ICD-10-CM | POA: Diagnosis not present

## 2016-07-31 DIAGNOSIS — Z955 Presence of coronary angioplasty implant and graft: Secondary | ICD-10-CM

## 2016-07-31 NOTE — Progress Notes (Signed)
Discharge Summary  Patient Details  Name: Paul Choi MRN: [id number] Date of Birth: July 14, 1946 Referring Provider:   Flowsheet Row Cardiac Rehab from 05/21/2016 in [facility]  Referring Provider  Virl Axe MD       Number of Visits: 36  Reason for Discharge:  Patient reached a stable level of exercise. Patient independent in their exercise.  Smoking History:  History  Smoking Status  . Former Smoker  . Packs/day: 1.00  . Years: 25.00  . Quit date: 09/22/1995  Smokeless Tobacco  . Never Used    Diagnosis:  Status post coronary artery stent placement  ADL UCSD:   Initial Exercise Prescription:     Initial Exercise Prescription - 05/21/16 1500      Date of Initial Exercise RX and Referring Provider   Date 05/21/16   Referring Provider Virl Axe MD     Treadmill   MPH 2   Grade 1.5   Minutes 15   METs 2.94     NuStep   Level 3   Watts --  80-100 spm   Minutes 15   METs 2     REL-XR   Level 3   Watts --  speed 50 rpm   Minutes 15   METs 3     Prescription Details   Frequency (times per week) 2   Duration Progress to 45 minutes of aerobic exercise without signs/symptoms of physical distress     Intensity   THRR 40-80% of Max Heartrate 104-133   Ratings of Perceived Exertion 11-13   Perceived Dyspnea 0-4     Progression   Progression Continue to progress workloads to maintain intensity without signs/symptoms of physical distress.     Resistance Training   Training Prescription Yes   Weight 3 lbs   Reps 10-12      Discharge Exercise Prescription (Final Exercise Prescription Changes):     Exercise Prescription Changes - 07/18/16 1400      Exercise Review   Progression Yes     Response to Exercise   Blood Pressure (Admit) 120/70   Blood Pressure (Exercise) 148/80   Blood Pressure (Exit) 100/60   Heart Rate (Admit) 68 bpm   Heart Rate (Exercise) 108 bpm   Heart Rate (Exit) 89 bpm   Rating of  Perceived Exertion (Exercise) 12   Symptoms none   Comments Home Exercise Guidelines given 06/05/16   Duration Progress to 45 minutes of aerobic exercise without signs/symptoms of physical distress   Intensity THRR unchanged     Progression   Progression Continue to progress workloads to maintain intensity without signs/symptoms of physical distress.   Average METs 4.21     Resistance Training   Training Prescription Yes   Weight 6 lbs   Reps 10-12     Interval Training   Interval Training No     Treadmill   MPH 3   Grade 2.5   Minutes 15   METs 4.54     NuStep   Level 4   Minutes 15   METs 3.5     REL-XR   Level 3   Minutes 15   METs 4.6     Home Exercise Plan   Plans to continue exercise at Home  walking, weights, and possibly going to community club house   Frequency Add 3 additional days to program exercise sessions.      Functional Capacity:     6 Minute Walk    Row Name  05/21/16 1524 07/19/16 1027       6 Minute Walk   Phase Initial Discharge    Distance 1245 feet 1500 feet    Distance % Change  - 20.5 %  255    Walk Time 6 minutes 6 minutes    # of Rest Breaks 0 0    MPH 2.36 2.84    METS 3 3.44    RPE 9 13    VO2 Peak 10.53 12.03    Symptoms No No    Resting HR 74 bpm 83 bpm    Resting BP 146/74 116/60    Max Ex. HR 114 bpm 104 bpm    Max Ex. BP 130/60 136/64    2 Minute Post BP 126/70  -       Psychological, QOL, Others - Outcomes: PHQ 2/9: Depression screen [facility] 2/9 07/17/2016 05/21/2016  Decreased Interest 0 0  Down, Depressed, Hopeless 0 0  PHQ - 2 Score 0 0  Altered sleeping 0 0  Tired, decreased energy 0 0  Change in appetite 0 2  Feeling bad or failure about yourself  0 0  Trouble concentrating 0 0  Moving slowly or fidgety/restless 0 0  Suicidal thoughts 0 0  PHQ-9 Score 0 2  Difficult doing work/chores Not difficult at all Not difficult at all    Quality of Life:     Quality of Life - 07/17/16 1122      Quality  of Life Scores   Health/Function Pre 21 %   Health/Function Post 29.57 %   Health/Function % Change 40.81 %   Socioeconomic Pre 20.29 %   Socioeconomic Post 29.64 %   Socioeconomic % Change  46.08 %   Psych/Spiritual Pre 20.86 %   Psych/Spiritual Post 30 %   Psych/Spiritual % Change 43.82 %   Family Pre 21 %   Family Post 30 %   Family % Change 42.86 %   GLOBAL Pre 20.82 %   GLOBAL Post 29.74 %   GLOBAL % Change 42.84 %      Personal Goals: Goals established at orientation with interventions provided to work toward goal.     Personal Goals and Risk Factors at Admission - 05/21/16 1443      Core Components/Risk Factors/Patient Goals on Admission    Weight Management Yes;Weight Loss   Intervention Weight Management: Develop a combined nutrition and exercise program designed to reach desired caloric intake, while maintaining appropriate intake of nutrient and fiber, sodium and fats, and appropriate energy expenditure required for the weight goal.;Weight Management: Provide education and appropriate resources to help participant work on and attain dietary goals.   Admit Weight 193 lb 11.2 oz (87.9 kg)   Goal Weight: Short Term 190 lb (86.2 kg)   Goal Weight: Long Term 180 lb (81.6 kg)   Expected Outcomes Short Term: Continue to assess and modify interventions until short term weight is achieved;Long Term: Adherence to nutrition and physical activity/exercise program aimed toward attainment of established weight goal;Weight Loss: Understanding of general recommendations for a balanced deficit meal plan, which promotes 1-2 lb weight loss per week and includes a negative energy balance of [phone] kcal/d;Understanding recommendations for meals to include 15-35% energy as protein, 25-35% energy from fat, 35-60% energy from carbohydrates, less than 200mg  of dietary cholesterol, 20-35 gm of total fiber daily;Understanding of distribution of calorie intake throughout the day with the consumption  of 4-5 meals/snacks   Increase Strength and Stamina Yes   Intervention Provide  advice, education, support and counseling about physical activity/exercise needs.;Develop an individualized exercise prescription for aerobic and resistive training based on initial evaluation findings, risk stratification, comorbidities and participant's personal goals.   Expected Outcomes Achievement of increased cardiorespiratory fitness and enhanced flexibility, muscular endurance and strength shown through measurements of functional capacity and personal statement of participant.   Diabetes Yes  Diet and exercise controlled   Intervention Provide education about signs/symptoms and action to take for hypo/hyperglycemia.;Provide education about proper nutrition, including hydration, and aerobic/resistive exercise prescription along with prescribed medications to achieve blood glucose in normal ranges: Fasting glucose 65-99 mg/dL   Expected Outcomes Short Term: Participant verbalizes understanding of the signs/symptoms and immediate care of hyper/hypoglycemia, proper foot care and importance of medication, aerobic/resistive exercise and nutrition plan for blood glucose control.;Long Term: Attainment of HbA1C < 7%.   Hypertension Yes   Intervention Provide education on lifestyle modifcations including regular physical activity/exercise, weight management, moderate sodium restriction and increased consumption of fresh fruit, vegetables, and low fat dairy, alcohol moderation, and smoking cessation.;Monitor prescription use compliance.   Expected Outcomes Short Term: Continued assessment and intervention until BP is < 140/33mm HG in hypertensive participants. < 130/43mm HG in hypertensive participants with diabetes, heart failure or chronic kidney disease.;Long Term: Maintenance of blood pressure at goal levels.   Lipids Yes   Intervention Provide education and support for participant on nutrition & aerobic/resistive exercise  along with prescribed medications to achieve LDL 70mg , HDL >40mg .   Expected Outcomes Short Term: Participant states understanding of desired cholesterol values and is compliant with medications prescribed. Participant is following exercise prescription and nutrition guidelines.;Long Term: Cholesterol controlled with medications as prescribed, with individualized exercise RX and with personalized nutrition plan. Value goals: LDL < 70mg , HDL > 40 mg.   Personal Goal Other Yes   Personal Goal Better exercise routine that can be maintained long term   Intervention Provide exercise prescription in class and for at home   Expected Outcomes Able to be independent with exercise and establish routine       Personal Goals Discharge:     Goals and Risk Factor Review    Row Name 06/07/16 1420 07/10/16 0931           Core Components/Risk Factors/Patient Goals Review   Personal Goals Review Weight Management/Obesity;Sedentary;Increase Strength and Stamina;Hypertension;Lipids;Diabetes Weight Management/Obesity;Hypertension;Lipids;Diabetes      Review Paul Choi is doing well in rehab.  His weight has started to trend down some.  He is walking every day for 35-40 min.  He is not checking his blood pressure at home, but it has been good here.  He is monitor his diabetes with exercise and diet.  He had tried to make improvements with his diet and plans to meet with the dietician next week.  Overall he is feeling better.  No problems with his statins. Paul Choi continues to do well working on Risk Factor goals.  He has reached his short term weight goal and per the RD he should reset his long term goal to 188 lbs. Albertine Grates attributes his progress to working with the nutrition goals, adding the home exercise and taking his meds to his progress.       Expected Outcomes Paul Choi will continue to come to education and exercise classes to work on risk factor modification.  We will continue to monitor his blod pressures and weight as well.  Paul Choi will continue to maintain his new weight, BP and cholsesterol and blood sugar control with his activiity,  exercise, nutrtion goals and meds as ordered         Nutrition & Weight - Outcomes:     Pre Biometrics - 05/21/16 1530      Pre Biometrics   Height 6' 0.4" (1.839 m)   Weight 193 lb 11.2 oz (87.9 kg)   Waist Circumference 39 inches   Hip Circumference 35 inches   Waist to Hip Ratio 1.11 %   BMI (Calculated) 26   Single Leg Stand 1.38 seconds         Post Biometrics - 07/31/16 1518       Post  Biometrics   Height 6' 0.4" (1.839 m)   Weight 190 lb 6.4 oz (86.4 kg)   Waist Circumference 38 inches   Hip Circumference 35.25 inches   Waist to Hip Ratio 1.08 %   BMI (Calculated) 25.6   Single Leg Stand 1.63 seconds      Nutrition:     Nutrition Therapy & Goals - 07/10/16 0924      Personal Nutrition Goals   Personal Goal #1 Control food portions at dinner meal -- especially starches, maybe meats. Eat pleny of low-carb vegetables.   Personal Goal #2 Add a snack/ lite meal at about noon, and another at about 8-9pm. (for blood sugar control and better appetite control at main meal)      Nutrition Discharge:     Nutrition Assessments - 07/17/16 1122      Rate Your Plate Scores   Pre Score 48   Pre Score % 55 %   Post Score 69   Post Score % 79.3 %   % Change 24.3 %      Education Questionnaire Score:     Knowledge Questionnaire Score - 07/17/16 1122      Knowledge Questionnaire Score   Pre Score 22/28   Post Score 27/28      Goals reviewed with patient; copy given to patient.

## 2016-07-31 NOTE — Progress Notes (Signed)
Daily Session Note  Patient Details  Name: Paul Choi MRN: [id number] Date of Birth: [birth year] Referring Provider:   Flowsheet Row Cardiac Rehab from 05/21/2016 in [facility] Re[facility]  Referring Provider  Virl Axe MD      Encounter Date: 07/31/2016  Check In:     Session Check In - 07/31/16 0847      Check-In   Location [facility]-Cardiac & Pulmonary Rehab   Staff Present Alberteen Sam, MA, ACSM RCEP, Exercise Physiologist;Susanne Bice, RN, BSN, CCRP;Laureen Owens Shark, BS, RRT, Respiratory Therapist   Supervising physician immediately available to respond to emergencies See telemetry face sheet for immediately available ER MD   Medication changes reported     No   Fall or balance concerns reported    No   Warm-up and Cool-down Performed on first and last piece of equipment   Resistance Training Performed Yes   VAD Patient? No     Pain Assessment   Currently in Pain? No/denies   Multiple Pain Sites No         Goals Met:  Independence with exercise equipment Exercise tolerated well No report of cardiac concerns or symptoms Strength training completed today  Goals Unmet:  Not Applicable  Comments:  Paul Choi graduated today from cardiac rehab with 36 sessions completed.  Details of the patient's exercise prescription and what He needs to do in order to continue the prescription and progress were discussed with patient.  Patient was given a copy of prescription and goals.  Patient verbalized understanding.  [name] plans to continue to exercise by walking and going to [employer].    Dr. Emily Filbert is Medical Director for [place] and [facility].

## 2016-07-31 NOTE — Progress Notes (Signed)
Cardiac Individual Treatment Plan  Patient Details  Name: Paul Choi MRN: [id number] Date of Birth: [birth year] Referring Provider:   Flowsheet Row Cardiac Rehab from 05/21/2016 in [facility] Cardiac and Pulmonary Rehab  Referring Provider  Virl Axe MD      Initial Encounter Date:  Flowsheet Row Cardiac Rehab from 05/21/2016 in [facility] Cardiac and Pulmonary Rehab  Date  05/21/16  Referring Provider  Virl Axe MD      Visit Diagnosis: Status post coronary artery stent placement  Patient's Home Medications on Admission:  Current Outpatient Prescriptions:  .  allopurinol (ZYLOPRIM) 100 MG tablet, Take 200 mg by mouth every evening. , Disp: , Rfl:  .  amLODipine (NORVASC) 5 MG tablet, Take 5 mg by mouth every evening. , Disp: , Rfl:  .  aspirin EC 81 MG tablet, Take 81 mg by mouth daily., Disp: , Rfl:  .  atorvastatin (LIPITOR) 40 MG tablet, Take 1 tablet (40 mg total) by mouth every evening., Disp: , Rfl:  .  cholecalciferol (VITAMIN D) 1000 units tablet, Take 1,000 Units by mouth every evening. , Disp: , Rfl:  .  clopidogrel (PLAVIX) 75 MG tablet, Take 1 tablet (75 mg total) by mouth every evening., Disp: 90 tablet, Rfl: 3 .  colchicine 0.6 MG tablet, Take 0.6 mg by mouth daily as needed (for gout flare ups.). , Disp: , Rfl:  .  docusate sodium (COLACE) 100 MG capsule, Take 200-300 mg by mouth daily as needed for mild constipation., Disp: , Rfl:  .  Fish Oil-Cholecalciferol (FISH OIL + D3 PO), Take 1 capsule by mouth daily. , Disp: , Rfl:  .  losartan (COZAAR) 100 MG tablet, Take 1 tablet (100 mg total) by mouth every evening., Disp: , Rfl:  .  polyethylene glycol (MIRALAX / GLYCOLAX) packet, Take 17 g by mouth daily as needed for mild constipation., Disp: , Rfl:   Past Medical History: Past Medical History:  Diagnosis Date  . Arteriosclerotic cardiovascular disease   . Arthritis 1997  . Coronary artery disease    Cardiac catheterization in August 2003 with occluded left  circumflex. Echo 8/13 with mild segmental LV dysfunction, EF 40%, moderate mitral insufficiency and mild tricuspid insufficiency; aortic valve appears bicuspid  . Diabetes mellitus without complication ([place])    TYPE 2   . ED (erectile dysfunction)   . Gout   . Gout   . Heart disease   . Hyperlipidemia   . Hypertension 1982  . Insomnia   . Personal history of colonic polyps   . Renal insufficiency     Tobacco Use: History  Smoking Status  . Former Smoker  . Packs/day: 1.00  . Years: 25.00  . Quit date: 09/22/1995  Smokeless Tobacco  . Never Used    Labs: Recent Review Flowsheet Data    There is no flowsheet data to display.       Exercise Target Goals:    Exercise Program Goal: Individual exercise prescription set with THRR, safety & activity barriers. Participant demonstrates ability to understand and report RPE using BORG scale, to self-measure pulse accurately, and to acknowledge the importance of the exercise prescription.  Exercise Prescription Goal: Starting with aerobic activity 30 plus minutes a day, 3 days per week for initial exercise prescription. Provide home exercise prescription and guidelines that participant acknowledges understanding prior to discharge.  Activity Barriers & Risk Stratification:     Activity Barriers & Cardiac Risk Stratification - 05/21/16 1440      Activity Barriers &  Cardiac Risk Stratification   Activity Barriers Other (comment);Balance Concerns  Gout, no flare in quite awhile, previous knee surgery   Cardiac Risk Stratification High      6 Minute Walk:     6 Minute Walk    Row Name 05/21/16 1524 07/19/16 1027       6 Minute Walk   Phase Initial Discharge    Distance 1245 feet 1500 feet    Distance % Change  - 20.5 %  255    Walk Time 6 minutes 6 minutes    # of Rest Breaks 0 0    MPH 2.36 2.84    METS 3 3.44    RPE 9 13    VO2 Peak 10.53 12.03    Symptoms No No    Resting HR 74 bpm 83 bpm    Resting BP 146/74  116/60    Max Ex. HR 114 bpm 104 bpm    Max Ex. BP 130/60 136/64    2 Minute Post BP 126/70  -       Initial Exercise Prescription:     Initial Exercise Prescription - 05/21/16 1500      Date of Initial Exercise RX and Referring Provider   Date 05/21/16   Referring Provider Virl Axe MD     Treadmill   MPH 2   Grade 1.5   Minutes 15   METs 2.94     NuStep   Level 3   Watts --  80-100 spm   Minutes 15   METs 2     REL-XR   Level 3   Watts --  speed 50 rpm   Minutes 15   METs 3     Prescription Details   Frequency (times per week) 2   Duration Progress to 45 minutes of aerobic exercise without signs/symptoms of physical distress     Intensity   THRR 40-80% of Max Heartrate 104-133   Ratings of Perceived Exertion 11-13   Perceived Dyspnea 0-4     Progression   Progression Continue to progress workloads to maintain intensity without signs/symptoms of physical distress.     Resistance Training   Training Prescription Yes   Weight 3 lbs   Reps 10-12      Perform Capillary Blood Glucose checks as needed.  Exercise Prescription Changes:     Exercise Prescription Changes    Row Name 05/21/16 1500 06/05/16 1400 06/20/16 0900 07/03/16 1500 07/18/16 1400     Exercise Review   Progression -  walk test results Yes Yes Yes Yes     Response to Exercise   Blood Pressure (Admit) 146/74 134/70 148/70 114/70 120/70   Blood Pressure (Exercise) 130/60 126/70 160/84 160/70 148/80   Blood Pressure (Exit) 126/70 126/70 148/70 110/60 100/60   Heart Rate (Admit) 74 bpm 60 bpm 72 bpm 67 bpm 68 bpm   Heart Rate (Exercise) 114 bpm 127 bpm 107 bpm 116 bpm 108 bpm   Heart Rate (Exit) 75 bpm 73 bpm 83 bpm 81 bpm 89 bpm   Oxygen Saturation (Admit) 100 %  -  -  -  -   Oxygen Saturation (Exit) 100 %  -  -  -  -   Rating of Perceived Exertion (Exercise) _0 Symptoms _1    Comments  - Home Exercise Guidelines given 06/05/16 Home Exercise  Guidelines given 06/05/16 Home Exercise Guidelines given 06/05/16 Home Exercise Guidelines given 06/05/16  Duration  - Progress to 45 minutes of aerobic exercise without signs/symptoms of physical distress Progress to 45 minutes of aerobic exercise without signs/symptoms of physical distress Progress to 45 minutes of aerobic exercise without signs/symptoms of physical distress Progress to 45 minutes of aerobic exercise without signs/symptoms of physical distress   Intensity  - THRR unchanged THRR unchanged THRR unchanged THRR unchanged     Progression   Progression  - Continue to progress workloads to maintain intensity without signs/symptoms of physical distress. Continue to progress workloads to maintain intensity without signs/symptoms of physical distress. Continue to progress workloads to maintain intensity without signs/symptoms of physical distress. Continue to progress workloads to maintain intensity without signs/symptoms of physical distress.   Average METs  - 3 3.55 4.65 4.21     Resistance Training   Training Prescription  - Yes Yes Yes Yes   Weight  - 6 lbs 6 lbs 6 lbs 6 lbs   Reps  - 10-12 10-12 10-12 10-12     Interval Training   Interval Training  - No No No No     Treadmill   MPH  - 2.3 2._0 Grade  - 2 2.5 2.5 2.5   Minutes  - _1 METs  - 3.4 3.95 4.54 4.54     NuStep   Level  - _2 Minutes  - _3 METs  - 2.7 2.7 3.5 3.5     REL-XR   Level  - _4 Minutes  - _5 METs  - 3.9 4 3.7 4.6     Home Exercise Plan   Plans to continue exercise at  - Home  walking, weights, and possibly going to community club house Home  walking, weights, and possibly going to [place]  walking, weights, and possibly going to [place]  walking, weights, and possibly going to community club house   Frequency  - Add 3 additional days to program exercise sessions. Add 3 additional days to program exercise  sessions. Add 3 additional days to program exercise sessions. Add 3 additional days to program exercise sessions.      Exercise Comments:     Exercise Comments    Row Name 05/21/16 1527 06/05/16 1057 06/05/16 1437 06/20/16 0928 07/03/16 1544   Exercise Comments Exercise goals are to lose weight and get into a better exercise routine that he can maintain long term. Reviewed home exercise with pt today.  Pt plans to walk and go to clubhouse in neighborhood for exercise.  Reviewed THR, pulse, RPE, sign and symptoms, and when to call 911 or MD.  Also discussed weather considerations and indoor options.  Pt voiced understanding. Clair Gulling is off to a good start with his exercise.  He is already doing level 4 on the NuStep.  We will continue to monitor for progression. Jim continunes to do well in rehab.  He is working hard and getting 4 METs on the XR.  We will continue to monitor his progress. Clair Gulling has been doing well in rehab.  He is now up to 3 mph on the treadmill. We will continue to monitor for progression.   [place] Name 07/18/16 1454 07/19/16 1030 07/19/16 1105 07/31/16 1517     Exercise Comments Clair Gulling will be graduating soon.  He continues to make improvements.  We will continue to  monitor his progression. Clair Gulling improved his walk test by 20.5%! Reviewed METs and progression today. Buell graduated today from cardiac rehab with 36 sessions completed.  Details of the patient's exercise prescription and what He needs to do in order to continue the prescription and progress were discussed with patient.  Patient was given a copy of prescription and goals.  Patient verbalized understanding.  Dondi plans to continue to exercise by walking and going to [employer].       Discharge Exercise Prescription (Final Exercise Prescription Changes):     Exercise Prescription Changes - 07/18/16 1400      Exercise Review   Progression Yes     Response to Exercise   Blood Pressure (Admit) 120/70   Blood  Pressure (Exercise) 148/80   Blood Pressure (Exit) 100/60   Heart Rate (Admit) 68 bpm   Heart Rate (Exercise) 108 bpm   Heart Rate (Exit) 89 bpm   Rating of Perceived Exertion (Exercise) 12   Symptoms none   Comments Home Exercise Guidelines given 06/05/16   Duration Progress to 45 minutes of aerobic exercise without signs/symptoms of physical distress   Intensity THRR unchanged     Progression   Progression Continue to progress workloads to maintain intensity without signs/symptoms of physical distress.   Average METs 4.21     Resistance Training   Training Prescription Yes   Weight 6 lbs   Reps 10-12     Interval Training   Interval Training No     Treadmill   MPH 3   Grade 2.5   Minutes 15   METs 4.54     NuStep   Level 4   Minutes 15   METs 3.5     REL-XR   Level 3   Minutes 15   METs 4.6     Home Exercise Plan   Plans to continue exercise at Home  walking, weights, and possibly going to community club house   Frequency Add 3 additional days to program exercise sessions.      Nutrition:  Target Goals: Understanding of nutrition guidelines, daily intake of sodium <1522m, cholesterol <2051m calories 30% from fat and 7% or less from saturated fats, daily to have 5 or more servings of fruits and vegetables.  Biometrics:     Pre Biometrics - 05/21/16 1530      Pre Biometrics   Height 6' 0.4" (1.839 m)   Weight 193 lb 11.2 oz (87.9 kg)   Waist Circumference 39 inches   Hip Circumference 35 inches   Waist to Hip Ratio 1.11 %   BMI (Calculated) 26   Single Leg Stand 1.38 seconds         Post Biometrics - 07/31/16 1518       Post  Biometrics   Height 6' 0.4" (1.839 m)   Weight 190 lb 6.4 oz (86.4 kg)   Waist Circumference 38 inches   Hip Circumference 35.25 inches   Waist to Hip Ratio 1.08 %   BMI (Calculated) 25.6   Single Leg Stand 1.63 seconds      Nutrition Therapy Plan and Nutrition Goals:     Nutrition Therapy & Goals - 07/10/16  0924      Personal Nutrition Goals   Personal Goal #1 Control food portions at dinner meal -- especially starches, maybe meats. Eat pleny of low-carb vegetables.   Personal Goal #2 Add a snack/ lite meal at about noon, and another at about 8-9pm. (for blood sugar control  and better appetite control at main meal)      Nutrition Discharge: Rate Your Plate Scores:     Nutrition Assessments - 07/17/16 1122      Rate Your Plate Scores   Pre Score 48   Pre Score % 55 %   Post Score 69   Post Score % 79.3 %   % Change 24.3 %      Nutrition Goals Re-Evaluation:     Nutrition Goals Re-Evaluation    Row Name 07/10/16 0936             Personal Goal #1 Re-Evaluation   Personal Goal #1 Control food portions at dinner meal -- especially starches, maybe meats. Eat pleny of low-carb vegetables.       Goal Progress Seen Yes       Comments HAs been working on portion sizes and  has seen positive changes in his weight change.  Expected outcome: Continued weight control and continued progress in meal planning         Personal Goal #2 Re-Evaluation   Personal Goal #2 Add a snack/ lite meal at about noon, and another at about 8-9pm. (for blood sugar control and better appetite control at main meal)       Goal Progress Seen Yes       Comments Has added the lite snack at noon and bedtime and states is doing well with this change in nutrition.  Expected outcome: Continue with advised meals plan          Psychosocial: Target Goals: Acknowledge presence or absence of depression, maximize coping skills, provide positive support system. Participant is able to verbalize types and ability to use techniques and skills needed for reducing stress and depression.  Initial Review & Psychosocial Screening:     Initial Psych Review & Screening - 05/21/16 1452      Initial Review   Current issues with Current Sleep Concerns  Will use OTC to help with sleep occasionally      Quality of Life  Scores:     Quality of Life - 07/17/16 1122      Quality of Life Scores   Health/Function Pre 21 %   Health/Function Post 29.57 %   Health/Function % Change 40.81 %   Socioeconomic Pre 20.29 %   Socioeconomic Post 29.64 %   Socioeconomic % Change  46.08 %   Psych/Spiritual Pre 20.86 %   Psych/Spiritual Post 30 %   Psych/Spiritual % Change 43.82 %   Family Pre 21 %   Family Post 30 %   Family % Change 42.86 %   GLOBAL Pre 20.82 %   GLOBAL Post 29.74 %   GLOBAL % Change 42.84 %      PHQ-9: Recent Review Flowsheet Data    Depression screen [facility] 2/9 07/17/2016 05/21/2016   Decreased Interest 0 0   Down, Depressed, Hopeless 0 0   PHQ - 2 Score 0 0   Altered sleeping 0 0   Tired, decreased energy 0 0   Change in appetite 0 2   Feeling bad or failure about yourself  0 0   Trouble concentrating 0 0   Moving slowly or fidgety/restless 0 0   Suicidal thoughts 0 0   PHQ-9 Score 0 2   Difficult doing work/chores Not difficult at all Not difficult at all      Psychosocial Evaluation and Intervention:     Psychosocial Evaluation - 06/05/16 0932      Psychosocial  Evaluation & Interventions   Comments Counselor met with Mr. Henard (Mr. D) today for initial psychosocial evaluation.  He is a 70 year old who had a stent inserted in September.  He has a strong support system with a spouse of 32 years and (3) adult children who live close by.  Mr. D also lives in a retirement community and is actively involved in his local church.  He also has borderline diabetes that is being treated with exercise and diet currently and he also has a liver disease currently.  He reports sleeping well with an occasional OTC sleep aid PRN; and his appetite is good.  He denies a history or current symptoms of depression or anxiety and states he is typically in a positive mood most of the time.  Mr. D states he has minimal stress other than "worrying" about his (81) grandchildren ages 52-26 occasionally.  His  goals for this program are to drop some weight and to increase his stamina and strength.        Psychosocial Re-Evaluation:     Psychosocial Re-Evaluation    [place] Name 07/10/16 0935             Psychosocial Re-Evaluation   Comments JIm continues to stay active daily at home and with his family. No voiced concerns this visit.       Continued Psychosocial Services Needed No          Vocational Rehabilitation: Provide vocational rehab assistance to qualifying candidates.   Vocational Rehab Evaluation & Intervention:     Vocational Rehab - 05/21/16 1441      Initial Vocational Rehab Evaluation & Intervention   Assessment shows need for Vocational Rehabilitation No      Education: Education Goals: Education classes will be provided on a weekly basis, covering required topics. Participant will state understanding/return demonstration of topics presented.  Learning Barriers/Preferences:     Learning Barriers/Preferences - 05/21/16 1440      Learning Barriers/Preferences   Learning Barriers None   Learning Preferences None      Education Topics: General Nutrition Guidelines/Fats and Fiber: -Group instruction provided by verbal, written material, models and posters to present the general guidelines for heart healthy nutrition. Gives an explanation and review of dietary fats and fiber. Flowsheet Row Cardiac Rehab from 07/31/2016 in [facility] Cardiac and Pulmonary Rehab  Date  07/03/16  Educator  SB  Instruction Review Code  2- meets goals/outcomes      Controlling Sodium/Reading Food Labels: -Group verbal and written material supporting the discussion of sodium use in heart healthy nutrition. Review and explanation with models, verbal and written materials for utilization of the food label. Flowsheet Row Cardiac Rehab from 07/31/2016 in [facility] Cardiac and Pulmonary Rehab  Date  07/10/16  Educator  PI  Instruction Review Code  2- meets goals/outcomes      Exercise  Physiology & Risk Factors: - Group verbal and written instruction with models to review the exercise physiology of the cardiovascular system and associated critical values. Details cardiovascular disease risk factors and the goals associated with each risk factor. Flowsheet Row Cardiac Rehab from 07/31/2016 in [facility] Cardiac and Pulmonary Rehab  Date  06/28/16  Educator  [place]  Instruction Review Code  2- meets goals/outcomes      Aerobic Exercise & Resistance Training: - Gives group verbal and written discussion on the health impact of inactivity. On the components of aerobic and resistive training programs and the benefits of this training and how to  safely progress through these programs. Flowsheet Row Cardiac Rehab from 07/31/2016 in [facility] Cardiac and Pulmonary Rehab  Date  07/17/16  Educator  [facility]  Instruction Review Code  2- meets goals/outcomes      Flexibility, Balance, General Exercise Guidelines: - Provides group verbal and written instruction on the benefits of flexibility and balance training programs. Provides general exercise guidelines with specific guidelines to those with heart or lung disease. Demonstration and skill practice provided. Flowsheet Row Cardiac Rehab from 07/31/2016 in [facility] Cardiac and Pulmonary Rehab  Date  07/19/16  Educator  [facility]  Instruction Review Code  R- Review/reinforce [Second Class]      Stress Management: - Provides group verbal and written instruction about the health risks of elevated stress, cause of high stress, and healthy ways to reduce stress. Flowsheet Row Cardiac Rehab from 07/31/2016 in [facility] Cardiac and Pulmonary Rehab  Date  06/07/16  Educator  SB  Instruction Review Code  2- meets goals/outcomes      Depression: - Provides group verbal and written instruction on the correlation between heart/lung disease and depressed mood, treatment options, and the stigmas associated with seeking treatment. Flowsheet Row Cardiac Rehab from 07/31/2016  in [facility] Cardiac and Pulmonary Rehab  Date  07/05/16  Educator  CE  Instruction Review Code  2- meets goals/outcomes      Anatomy & Physiology of the Heart: - Group verbal and written instruction and models provide basic cardiac anatomy and physiology, with the coronary electrical and arterial systems. Review of: AMI, Angina, Valve disease, Heart Failure, Cardiac Arrhythmia, Pacemakers, and the ICD. Flowsheet Row Cardiac Rehab from 07/31/2016 in [facility] Cardiac and Pulmonary Rehab  Date  07/24/16  Educator  SB  Instruction Review Code  2- meets goals/outcomes      Cardiac Procedures: - Group verbal and written instruction and models to describe the testing methods done to diagnose heart disease. Reviews the outcomes of the test results. Describes the treatment choices: Medical Management, Angioplasty, or Coronary Bypass Surgery. Flowsheet Row Cardiac Rehab from 07/31/2016 in [facility] Cardiac and Pulmonary Rehab  Date  07/31/16  Educator  SB  Instruction Review Code  2- meets goals/outcomes      Cardiac Medications: - Group verbal and written instruction to review commonly prescribed medications for heart disease. Reviews the medication, class of the drug, and side effects. Includes the steps to properly store meds and maintain the prescription regimen. Flowsheet Row Cardiac Rehab from 07/31/2016 in [facility] Cardiac and Pulmonary Rehab  Date  06/14/16 [10/31 Part 2]  Educator  C. EnterkinRN  Instruction Review Code  2- meets goals/outcomes      Go Sex-Intimacy & Heart Disease, Get SMART - Goal Setting: - Group verbal and written instruction through game format to discuss heart disease and the return to sexual intimacy. Provides group verbal and written material to discuss and apply goal setting through the application of the S.M.A.R.T. Method. Flowsheet Row Cardiac Rehab from 07/31/2016 in [facility] Cardiac and Pulmonary Rehab  Date  07/31/16  Educator  SB  Instruction Review Code  2- meets  goals/outcomes      Other Matters of the Heart: - Provides group verbal, written materials and models to describe Heart Failure, Angina, Valve Disease, and Diabetes in the realm of heart disease. Includes description of the disease process and treatment options available to the cardiac patient. Flowsheet Row Cardiac Rehab from 07/31/2016 in [facility] Cardiac and Pulmonary Rehab  Date  06/05/16  Educator  SB  Instruction Review Code  2- meets goals/outcomes      Exercise & Equipment Safety: - Individual verbal instruction and demonstration of equipment use and safety with use of the equipment. Flowsheet Row Cardiac Rehab from 07/31/2016 in [facility] Cardiac and Pulmonary Rehab  Date  05/21/16  Educator  SB  Instruction Review Code  2- meets goals/outcomes      Infection Prevention: - Provides verbal and written material to individual with discussion of infection control including proper hand washing and proper equipment cleaning during exercise session. Flowsheet Row Cardiac Rehab from 07/31/2016 in [facility] Cardiac and Pulmonary Rehab  Date  05/21/16  Educator  SB  Instruction Review Code  2- meets goals/outcomes      Falls Prevention: - Provides verbal and written material to individual with discussion of falls prevention and safety.   Diabetes: - Individual verbal and written instruction to review signs/symptoms of diabetes, desired ranges of glucose level fasting, after meals and with exercise. Advice that pre and post exercise glucose checks will be done for 3 sessions at entry of program. Flowsheet Row Cardiac Rehab from 07/31/2016 in [facility] Cardiac and Pulmonary Rehab  Date  05/21/16  Educator  SB  Instruction Review Code  2- meets goals/outcomes       Knowledge Questionnaire Score:     Knowledge Questionnaire Score - 07/17/16 1122      Knowledge Questionnaire Score   Pre Score 22/28   Post Score 27/28      Core Components/Risk Factors/Patient Goals at Admission:      Personal Goals and Risk Factors at Admission - 05/21/16 1443      Core Components/Risk Factors/Patient Goals on Admission    Weight Management Yes;Weight Loss   Intervention Weight Management: Develop a combined nutrition and exercise program designed to reach desired caloric intake, while maintaining appropriate intake of nutrient and fiber, sodium and fats, and appropriate energy expenditure required for the weight goal.;Weight Management: Provide education and appropriate resources to help participant work on and attain dietary goals.   Admit Weight 193 lb 11.2 oz (87.9 kg)   Goal Weight: Short Term 190 lb (86.2 kg)   Goal Weight: Long Term 180 lb (81.6 kg)   Expected Outcomes Short Term: Continue to assess and modify interventions until short term weight is achieved;Long Term: Adherence to nutrition and physical activity/exercise program aimed toward attainment of established weight goal;Weight Loss: Understanding of general recommendations for a balanced deficit meal plan, which promotes 1-2 lb weight loss per week and includes a negative energy balance of [phone] kcal/d;Understanding recommendations for meals to include 15-35% energy as protein, 25-35% energy from fat, 35-60% energy from carbohydrates, less than 246m of dietary cholesterol, 20-35 gm of total fiber daily;Understanding of distribution of calorie intake throughout the day with the consumption of 4-5 meals/snacks   Increase Strength and Stamina Yes   Intervention Provide advice, education, support and counseling about physical activity/exercise needs.;Develop an individualized exercise prescription for aerobic and resistive training based on initial evaluation findings, risk stratification, comorbidities and participant's personal goals.   Expected Outcomes Achievement of increased cardiorespiratory fitness and enhanced flexibility, muscular endurance and strength shown through measurements of functional capacity and personal  statement of participant.   Diabetes Yes  Diet and exercise controlled   Intervention Provide education about signs/symptoms and action to take for hypo/hyperglycemia.;Provide education about proper nutrition, including hydration, and aerobic/resistive exercise prescription along with prescribed medications to achieve blood glucose in normal ranges: Fasting glucose 65-99 mg/dL   Expected Outcomes Short Term: Participant verbalizes  understanding of the signs/symptoms and immediate care of hyper/hypoglycemia, proper foot care and importance of medication, aerobic/resistive exercise and nutrition plan for blood glucose control.;Long Term: Attainment of HbA1C < 7%.   Hypertension Yes   Intervention Provide education on lifestyle modifcations including regular physical activity/exercise, weight management, moderate sodium restriction and increased consumption of fresh fruit, vegetables, and low fat dairy, alcohol moderation, and smoking cessation.;Monitor prescription use compliance.   Expected Outcomes Short Term: Continued assessment and intervention until BP is < 140/8m HG in hypertensive participants. < 130/816mHG in hypertensive participants with diabetes, heart failure or chronic kidney disease.;Long Term: Maintenance of blood pressure at goal levels.   Lipids Yes   Intervention Provide education and support for participant on nutrition & aerobic/resistive exercise along with prescribed medications to achieve LDL <7071mHDL >65m60m Expected Outcomes Short Term: Participant states understanding of desired cholesterol values and is compliant with medications prescribed. Participant is following exercise prescription and nutrition guidelines.;Long Term: Cholesterol controlled with medications as prescribed, with individualized exercise RX and with personalized nutrition plan. Value goals: LDL < 70mg64mL > 40 mg.   Personal Goal Other Yes   Personal Goal Better exercise routine that can be maintained  long term   Intervention Provide exercise prescription in class and for at home   Expected Outcomes Able to be independent with exercise and establish routine      Core Components/Risk Factors/Patient Goals Review:      Goals and Risk Factor Review    Row Name 06/07/16 1420 07/10/16 0931           Core Components/Risk Factors/Patient Goals Review   Personal Goals Review Weight Management/Obesity;Sedentary;Increase Strength and Stamina;Hypertension;Lipids;Diabetes Weight Management/Obesity;Hypertension;Lipids;Diabetes      Review Jim iClair Gullingoing well in rehab.  His weight has started to trend down some.  He is walking every day for 35-40 min.  He is not checking his blood pressure at home, but it has been good here.  He is monitor his diabetes with exercise and diet.  He had tried to make improvements with his diet and plans to meet with the dietician next week.  Overall he is feeling better.  No problems with his statins. JIm continues to do well working on Risk Factor goals.  He has reached his short term weight goal and per the RD he should reset his long term goal to 188 lbs. Jin aAlbertine Gratesibutes his progress to working with the nutrition goals, adding the home exercise and taking his meds to his progress.       Expected Outcomes Jim wClair Gulling continue to come to education and exercise classes to work on risk factor modification.  We will continue to monitor his blod pressures and weight as well. JIm will continue to maintain his new weight, BP and cholsesterol and blood sugar control with his activiity, exercise, nutrtion goals and meds as ordered         Core Components/Risk Factors/Patient Goals at Discharge (Final Review):      Goals and Risk Factor Review - 07/10/16 0931      Core Components/Risk Factors/Patient Goals Review   Personal Goals Review Weight Management/Obesity;Hypertension;Lipids;Diabetes   Review JIm continues to do well working on Risk Factor goals.  He has reached his short  term weight goal and per the RD he should reset his long term goal to 188 lbs. Jin aAlbertine Gratesibutes his progress to working with the nutrition goals, adding the home exercise and taking his meds to his progress.  Expected Outcomes JIm will continue to maintain his new weight, BP and cholsesterol and blood sugar control with his activiity, exercise, nutrtion goals and meds as ordered      ITP Comments:     ITP Comments    Row Name 05/21/16 1447 05/30/16 0644 06/27/16 0655 07/05/16 0945 07/25/16 0554   ITP Comments Initial ITP created during medical review. Diagnosis documentation found [facility]OUN[facility] in [place] 04/26/2016 30 day review. Continue with ITP unless changes noted by Medical Director at signature of review. New to program 30 day review completed for Medical Director physician review and signature. Continue ITP unless changes made by physician. [place] PVC's today while exercsing on the treadmill at 3.46mh. JClair Gullingsaid he felt short of breath when he was having those irregular beats. When we had him stop on the treadmill he went back into sinus rhythm. Given a banana to eat since he said he did not have any caffeine and his sleep was good last night. Given 240cc water also.  30 day review completed for review by Dr MEmily Filbert  Continue with ITP unless changes noted by Dr MSabra Heck      Comments: Discharge ITP

## 2016-11-08 ENCOUNTER — Ambulatory Visit (INDEPENDENT_AMBULATORY_CARE_PROVIDER_SITE_OTHER): Payer: Medicare Other | Admitting: General Surgery

## 2016-11-08 ENCOUNTER — Encounter: Payer: Self-pay | Admitting: General Surgery

## 2016-11-08 VITALS — BP 132/82 | HR 86 | Resp 14 | Ht 72.0 in | Wt 195.0 lb

## 2016-11-08 DIAGNOSIS — Z86010 Personal history of colonic polyps: Secondary | ICD-10-CM | POA: Insufficient documentation

## 2016-11-08 DIAGNOSIS — R1031 Right lower quadrant pain: Secondary | ICD-10-CM | POA: Diagnosis not present

## 2016-11-08 NOTE — Progress Notes (Signed)
Patient ID: Paul Choi, male   DOB: Jun 20, 1946, 71 y.o.   MRN: [id number]  Chief Complaint  Patient presents with  . Colonoscopy    HPI Paul Choi is a 71 y.o. male here for a colonoscopy discussion. Last colonoscopy was 10/31/2011. Moves his bowels daily.  HPI  Past Medical History:  Diagnosis Date  . Arteriosclerotic cardiovascular disease   . Arthritis 1997  . Coronary artery disease    Cardiac catheterization in August 2003 with occluded left circumflex. Echo 8/13 with mild segmental LV dysfunction, EF 40%, moderate mitral insufficiency and mild tricuspid insufficiency; aortic valve appears bicuspid  . Diabetes mellitus without complication ([place])    TYPE 2   . ED (erectile dysfunction)   . Gout   . Gout   . Heart disease   . Hyperlipidemia   . Hypertension 1982  . Insomnia   . Personal history of colonic polyps   . Renal insufficiency     Past Surgical History:  Procedure Laterality Date  . CARDIAC CATHETERIZATION  2006  . CARDIAC CATHETERIZATION N/A 05/04/2016   Procedure: Left Heart Cath and Coronary Angiography;  Surgeon: Sherren Mocha, MD;  Location: [facility];  Service: Cardiovascular;  Laterality: N/A;  . CARDIAC CATHETERIZATION N/A 05/04/2016   Procedure: Coronary Stent Intervention;  Surgeon: Sherren Mocha, MD;  Location: [facility];  Service: Cardiovascular;  Laterality: N/A;  . COLONOSCOPY  2007  . CORONARY STENT PLACEMENT  05/04/2016   proximal RCA treated successfully with coronary stenting using a 3.5 x 16 mm Promus drug-eluting stent  . CORONARY STENT PLACEMENT  05/04/2016   Mid LAD lesion, 30 %stenosed.  Marland Kitchen HERNIA REPAIR Left 08/23/2006   Direct inguinal hernia repair with a large ultra Pro mesh.  Marland Kitchen KNEE SURGERY  1988    Family History  Problem Relation Age of Onset  . Hypertension Mother   . Hypertension Brother     Social History Social History  Substance Use Topics  . Smoking status: Former Smoker    Packs/day:  1.00    Years: 25.00    Quit date: 09/22/1995  . Smokeless tobacco: Never Used  . Alcohol use Yes     Comment: OCCASIONAL    Allergies  Allergen Reactions  . Ace Inhibitors Cough  . Contrast Media [Iodinated Diagnostic Agents] Other (See Comments)    Cough and difficulty breathing   . Lovastatin Other (See Comments)    Myalgia at 80-mg dose  . Other Nausea Only, Hives and Cough    Contrast media  Had difficulty breathing  . Shellfish Allergy Hives  . Trazodone Other (See Comments)    "felt like zombie"    Current Outpatient Prescriptions  Medication Sig Dispense Refill  . allopurinol (ZYLOPRIM) 100 MG tablet Take 200 mg by mouth every evening.     Marland Kitchen amLODipine (NORVASC) 5 MG tablet Take 5 mg by mouth every evening.     Marland Kitchen aspirin EC 81 MG tablet Take 81 mg by mouth daily.    Marland Kitchen atorvastatin (LIPITOR) 40 MG tablet     . cholecalciferol (VITAMIN D) 1000 units tablet Take 1,000 Units by mouth every evening.     . clopidogrel (PLAVIX) 75 MG tablet Take 1 tablet (75 mg total) by mouth every evening. 90 tablet 3  . colchicine 0.6 MG tablet Take 0.6 mg by mouth daily as needed (for gout flare ups.).     Marland Kitchen docusate sodium (COLACE) 100 MG capsule Take 200-300 mg by mouth  daily as needed for mild constipation.    . Fish Oil-Cholecalciferol (FISH OIL + D3 PO) Take 1 capsule by mouth daily.     Marland Kitchen losartan (COZAAR) 100 MG tablet Take 1 tablet (100 mg total) by mouth every evening.    . polyethylene glycol (MIRALAX / GLYCOLAX) packet Take 17 g by mouth daily as needed for mild constipation.     No current facility-administered medications for this visit.     Review of Systems Review of Systems  Constitutional: Negative.   Respiratory: Negative.   Cardiovascular: Negative.   Gastrointestinal: Negative.     Blood pressure 132/82, pulse 86, resp. rate 14, height 6' (1.829 m), weight 195 lb (88.5 kg).  Physical Exam Physical Exam  Constitutional: He is oriented to person, place, and  time. He appears well-developed and well-nourished.  Genitourinary:     Neurological: He is alert and oriented to person, place, and time.  Skin: Skin is warm and dry.  Psychiatric: He has a normal mood and affect.    Data Reviewed Colonoscopy dated 10/19/2011 showed a 5 mm polyp in the ascending colon. Tubular adenoma.  Colonoscopy dated 07/25/2006 showed polyps in the transverse based bar and sigmoid colons. Diverticulosis noted. Cecal polyp, tubular adenoma; transverse colon polyp: Tubular adenoma, sigmoid colon polyp pedunculated villous adenoma with high-grade dysplasia limited to the head of the adenoma. Stalk clear. Sigmoid colon polyp, hyperplastic.  Review of recent cardiac stent placement reviewed. Drug-eluting stent placed.   Assessment    Previous pedunculated villous adenoma with high-grade dysplasia in the sigmoid colon 2007, tubular adenoma in the ascending colon 2013.    Plan    The patient will be a candidate for a follow-up colonoscopy in September 2018 when he has completed a year of antiplatelet therapy secondary to placement of a drug-eluting stent in September 2017.  Weakness in the right groin without major symptoms, continued exercises tolerated.   Patient to return in 6 months for his colonoscopy.   This information has been scribed by Gaspar Cola CMA.     Robert Bellow 11/08/2016, 1:39 PM

## 2016-11-08 NOTE — Progress Notes (Deleted)
Patient ID: Paul Choi, male   DOB: September 09, 1945, 71 y.o.   MRN: [id number]  Chief Complaint  Patient presents with  . Colonoscopy    HPI Paul Choi is a 71 y.o. male   HPI  Past Medical History:  Diagnosis Date  . Arteriosclerotic cardiovascular disease   . Arthritis 1997  . Coronary artery disease    Cardiac catheterization in August 2003 with occluded left circumflex. Echo 8/13 with mild segmental LV dysfunction, EF 40%, moderate mitral insufficiency and mild tricuspid insufficiency; aortic valve appears bicuspid  . Diabetes mellitus without complication ([place])    TYPE 2   . ED (erectile dysfunction)   . Gout   . Gout   . Heart disease   . Hyperlipidemia   . Hypertension 1982  . Insomnia   . Personal history of colonic polyps   . Renal insufficiency     Past Surgical History:  Procedure Laterality Date  . CARDIAC CATHETERIZATION  2006  . CARDIAC CATHETERIZATION N/A 05/04/2016   Procedure: Left Heart Cath and Coronary Angiography;  Surgeon: Sherren Mocha, MD;  Location: [facility];  Service: Cardiovascular;  Laterality: N/A;  . CARDIAC CATHETERIZATION N/A 05/04/2016   Procedure: Coronary Stent Intervention;  Surgeon: Sherren Mocha, MD;  Location: [facility];  Service: Cardiovascular;  Laterality: N/A;  . COLONOSCOPY  2007  . CORONARY STENT PLACEMENT  05/04/2016   proximal RCA treated successfully with coronary stenting using a 3.5 x 16 mm Promus drug-eluting stent  . CORONARY STENT PLACEMENT  05/04/2016   Mid LAD lesion, 30 %stenosed.  Marland Kitchen HERNIA REPAIR    . KNEE SURGERY  1988    Family History  Problem Relation Age of Onset  . Hypertension Mother   . Hypertension Brother     Social History Social History  Substance Use Topics  . Smoking status: Former Smoker    Packs/day: 1.00    Years: 25.00    Quit date: 09/22/1995  . Smokeless tobacco: Never Used  . Alcohol use Yes     Comment: OCCASIONAL    Allergies  Allergen Reactions  .  Ace Inhibitors Cough  . Contrast Media [Iodinated Diagnostic Agents] Other (See Comments)    Cough and difficulty breathing   . Lovastatin Other (See Comments)    Myalgia at 80-mg dose  . Other Nausea Only, Hives and Cough    Contrast media  Had difficulty breathing  . Shellfish Allergy Hives  . Trazodone Other (See Comments)    "felt like zombie"    Current Outpatient Prescriptions  Medication Sig Dispense Refill  . allopurinol (ZYLOPRIM) 100 MG tablet Take 200 mg by mouth every evening.     Marland Kitchen amLODipine (NORVASC) 5 MG tablet Take 5 mg by mouth every evening.     Marland Kitchen aspirin EC 81 MG tablet Take 81 mg by mouth daily.    Marland Kitchen atorvastatin (LIPITOR) 40 MG tablet Take 1 tablet (40 mg total) by mouth every evening.    . cholecalciferol (VITAMIN D) 1000 units tablet Take 1,000 Units by mouth every evening.     . clopidogrel (PLAVIX) 75 MG tablet Take 1 tablet (75 mg total) by mouth every evening. 90 tablet 3  . colchicine 0.6 MG tablet Take 0.6 mg by mouth daily as needed (for gout flare ups.).     Marland Kitchen docusate sodium (COLACE) 100 MG capsule Take 200-300 mg by mouth daily as needed for mild constipation.    . Fish Oil-Cholecalciferol (FISH OIL +  D3 PO) Take 1 capsule by mouth daily.     Marland Kitchen losartan (COZAAR) 100 MG tablet Take 1 tablet (100 mg total) by mouth every evening.    . polyethylene glycol (MIRALAX / GLYCOLAX) packet Take 17 g by mouth daily as needed for mild constipation.     No current facility-administered medications for this visit.     Review of Systems Review of Systems  Constitutional: Negative.   Respiratory: Negative.   Cardiovascular: Negative.   Gastrointestinal: Negative.     There were no vitals taken for this visit.  Physical Exam Physical Exam  Constitutional: He is oriented to person, place, and time. He appears well-developed and well-nourished.  Cardiovascular: Normal rate, regular rhythm and normal heart sounds.   Neurological: He is alert and oriented to  person, place, and time.  Skin: Skin is warm and dry.    Data Reviewed ***  Assessment    ***    Plan    ***      Paul Choi 11/08/2016, 11:30 AM

## 2016-11-08 NOTE — Patient Instructions (Addendum)
Patient to return in one year for his colonoscopy.

## 2016-11-13 ENCOUNTER — Encounter: Payer: Self-pay | Admitting: General Surgery

## 2016-11-15 ENCOUNTER — Encounter: Payer: Self-pay | Admitting: Internal Medicine

## 2016-11-15 ENCOUNTER — Ambulatory Visit (INDEPENDENT_AMBULATORY_CARE_PROVIDER_SITE_OTHER): Payer: Medicare Other | Admitting: Internal Medicine

## 2016-11-15 VITALS — BP 128/74 | HR 78 | Ht 72.0 in | Wt 196.5 lb

## 2016-11-15 DIAGNOSIS — I340 Nonrheumatic mitral (valve) insufficiency: Secondary | ICD-10-CM | POA: Diagnosis not present

## 2016-11-15 DIAGNOSIS — I429 Cardiomyopathy, unspecified: Secondary | ICD-10-CM

## 2016-11-15 DIAGNOSIS — I493 Ventricular premature depolarization: Secondary | ICD-10-CM

## 2016-11-15 DIAGNOSIS — I359 Nonrheumatic aortic valve disorder, unspecified: Secondary | ICD-10-CM

## 2016-11-15 DIAGNOSIS — R002 Palpitations: Secondary | ICD-10-CM | POA: Diagnosis not present

## 2016-11-15 NOTE — Progress Notes (Signed)
Patient Care Team: Adin Hector, MD as PCP - General (Internal Medicine) Robert Bellow, MD (General Surgery)   HPI  Paul Choi is a 71 y.o. male Seen in followup for PVCs and bicuspid aortic valve; he is also status post PCI 9/17 as noted below.  During the eval of the above, he underwent CTA with Ca score 750 with high grade RCA disease   Nuclear scan had EF 42% with predominantly fixed defect in the basal to mid lateral and inferolateral walls and a small to moderate reversible defect in the mid to apical anterior and anteroseptal walls  Given the absence of symptoms, he was referred for preeval with Dr [facility] who after exhaustive evaluation,  LHC 9/17 >>   1. Critical stenosis of the proximal RCA treated successfully with coronary stenting using a 3.5 x 16 mm Promus drug-eluting stent 2. Chronic occlusion of the mid circumflex with the first OM branch collateralized from the LAD and right coronary artery 3. Moderate stenosis of a large ramus intermedius branch 4. Diffuse nonobstructive stenosis of the LAD 5. Normal LV function by noninvasive assessment    No further chest discomfort. Functional status remains improved.  He is having hematuria. He spoke to his PCP decided to wait until 9/18 i.e. one year post intervention.  PVCs are asymptomatic.  Records and Results Reviewed  9/17/  K 5.4 05/13/16 K 4.7  Past Medical History:  Diagnosis Date  . Arteriosclerotic cardiovascular disease   . Arthritis 1997  . Coronary artery disease    Cardiac catheterization in August 2003 with occluded left circumflex. Echo 8/13 with mild segmental LV dysfunction, EF 40%, moderate mitral insufficiency and mild tricuspid insufficiency; aortic valve appears bicuspid  . Diabetes mellitus without complication ([place])    TYPE 2   . ED (erectile dysfunction)   . Gout   . Gout   . Heart disease   . Hyperlipidemia   . Hypertension 1982  . Insomnia   . Personal history of  colonic polyps   . Renal insufficiency     Past Surgical History:  Procedure Laterality Date  . CARDIAC CATHETERIZATION  2006  . CARDIAC CATHETERIZATION N/A 05/04/2016   Procedure: Left Heart Cath and Coronary Angiography;  Surgeon: Sherren Mocha, MD;  Location: [facility];  Service: Cardiovascular;  Laterality: N/A;  . CARDIAC CATHETERIZATION N/A 05/04/2016   Procedure: Coronary Stent Intervention;  Surgeon: Sherren Mocha, MD;  Location: [facility];  Service: Cardiovascular;  Laterality: N/A;  . COLONOSCOPY  2007  . CORONARY STENT PLACEMENT  05/04/2016   proximal RCA treated successfully with coronary stenting using a 3.5 x 16 mm Promus drug-eluting stent  . CORONARY STENT PLACEMENT  05/04/2016   Mid LAD lesion, 30 %stenosed.  Marland Kitchen HERNIA REPAIR Left 08/23/2006   Direct inguinal hernia repair with a large ultra Pro mesh.  Marland Kitchen KNEE SURGERY  1988    Current Outpatient Prescriptions  Medication Sig Dispense Refill  . allopurinol (ZYLOPRIM) 100 MG tablet Take 200 mg by mouth every evening.     Marland Kitchen amLODipine (NORVASC) 5 MG tablet Take 5 mg by mouth every evening.     Marland Kitchen aspirin EC 81 MG tablet Take 81 mg by mouth daily.    Marland Kitchen atorvastatin (LIPITOR) 40 MG tablet     . cholecalciferol (VITAMIN D) 1000 units tablet Take 1,000 Units by mouth every evening.     . clopidogrel (PLAVIX) 75 MG tablet Take 1  tablet (75 mg total) by mouth every evening. 90 tablet 3  . colchicine 0.6 MG tablet Take 0.6 mg by mouth daily as needed (for gout flare ups.).     Marland Kitchen docusate sodium (COLACE) 100 MG capsule Take 200-300 mg by mouth daily as needed for mild constipation.    . Fish Oil-Cholecalciferol (FISH OIL + D3 PO) Take 1 capsule by mouth daily.     Marland Kitchen losartan (COZAAR) 100 MG tablet Take 1 tablet (100 mg total) by mouth every evening.    . polyethylene glycol (MIRALAX / GLYCOLAX) packet Take 17 g by mouth daily as needed for mild constipation.     No current facility-administered medications for  this visit.     Allergies  Allergen Reactions  . Ace Inhibitors Cough  . Contrast Media [Iodinated Diagnostic Agents] Other (See Comments)    Cough and difficulty breathing   . Lovastatin Other (See Comments)    Myalgia at 80-mg dose  . Other Nausea Only, Hives and Cough    Contrast media  Had difficulty breathing  . Shellfish Allergy Hives  . Trazodone Other (See Comments)    "felt like zombie"      Review of Systems negative except from HPI and PMH  Physical Exam BP 128/74 (BP Location: Left Arm, Patient Position: Sitting, Cuff Size: Normal)   Pulse 78   Ht 6' (1.829 m)   Wt 196 lb 8 oz (89.1 kg)   BMI 26.65 kg/m  Well developed and well nourished in no acute distress HENT normal E scleral and icterus clear Neck Supple JVP flat; carotids brisk and full Clear to ausculation  Regular rate and rhythm, 2/6  Soft with active bowel sounds No clubbing cyanosis  Edema Alert and oriented, grossly normal motor and sensory function Skin Warm and Dry    ECG personally reviewed rSinus rhythm at 78 Intervals 03/28/38 frequent PVCs left bundle intermediate axis    Assessment and  Plan  Palpitations-PVC   Coronary artery disease with an occluded circumflex, left-right collaterals 2003  High-grade RCA stenosis status post DES   Cardiomyopathy  Resolved  >>>>>>Mitral regurgitation-moderate  Bicuspid aortic valve/mild aortic root dilitation   Hypertension  Hematuria  Patient remains much improved following intervention with less exercise intolerance   He will need annual surveillance of his aortic root and aortic valve. We will undertake an echo    Discussed with Dr. Billee Cashing. 6 months is sufficient for DAPT given the elective nature of his PCI. Hence, we will be in touch with Dr. Caryn Section and have mentioned to the patient that seen the urologist now makes sense  Blood pressure is well-controlled  PVCs are asymptomatic. The next issue will be if there is any untoward  effect on his LV function that might prompt the need for intervention   More than 50% of 45 min was spent in counseling related to the above   Current medicines are reviewed at length with the patient today .  The patient does not  have concerns regarding medicines.

## 2016-11-15 NOTE — Patient Instructions (Signed)
Medication Instructions: - Your physician recommends that you continue on your current medications as directed. Please refer to the Current Medication list given to you today.  Labwork: - none ordered  Procedures/Testing: - Your physician has requested that you have an echocardiogram. Echocardiography is a painless test that uses sound waves to create images of your heart. It provides your doctor with information about the size and shape of your heart and how well your heart's chambers and valves are working. This procedure takes approximately one hour. There are no restrictions for this procedure.  Follow-Up: - Your physician wants you to follow-up in: 1 year with Dr. Caryl Comes. You will receive a reminder letter in the mail two months in advance. If you don't receive a letter, please call our office to schedule the follow-up appointment.   Any Additional Special Instructions Will Be Listed Below (If Applicable).     If you need a refill on your cardiac medications before your next appointment, please call your pharmacy.

## 2016-11-30 ENCOUNTER — Other Ambulatory Visit: Payer: Self-pay | Admitting: Ophthalmology

## 2016-11-30 DIAGNOSIS — H34211 Partial retinal artery occlusion, right eye: Secondary | ICD-10-CM

## 2016-12-05 ENCOUNTER — Ambulatory Visit
Admission: RE | Admit: 2016-12-05 | Discharge: 2016-12-05 | Disposition: A | Discharge status: Home / Self Care | Payer: Medicare Other | Source: Ambulatory Visit | Attending: Ophthalmology | Admitting: Ophthalmology

## 2016-12-05 DIAGNOSIS — H34211 Partial retinal artery occlusion, right eye: Secondary | ICD-10-CM

## 2016-12-05 DIAGNOSIS — I6523 Occlusion and stenosis of bilateral carotid arteries: Secondary | ICD-10-CM | POA: Diagnosis not present

## 2016-12-27 ENCOUNTER — Ambulatory Visit (INDEPENDENT_AMBULATORY_CARE_PROVIDER_SITE_OTHER): Payer: Medicare Other

## 2016-12-27 ENCOUNTER — Other Ambulatory Visit: Payer: Self-pay

## 2016-12-27 DIAGNOSIS — I359 Nonrheumatic aortic valve disorder, unspecified: Secondary | ICD-10-CM | POA: Diagnosis not present

## 2017-01-08 ENCOUNTER — Other Ambulatory Visit: Payer: Self-pay | Admitting: *Deleted

## 2017-01-08 DIAGNOSIS — I350 Nonrheumatic aortic (valve) stenosis: Secondary | ICD-10-CM

## 2017-01-08 DIAGNOSIS — I77819 Aortic ectasia, unspecified site: Secondary | ICD-10-CM

## 2017-02-18 ENCOUNTER — Other Ambulatory Visit: Payer: Self-pay | Admitting: Internal Medicine

## 2017-02-18 ENCOUNTER — Other Ambulatory Visit: Payer: Self-pay | Admitting: Cardiovascular Disease

## 2017-04-30 ENCOUNTER — Telehealth: Payer: Self-pay | Admitting: Cardiovascular Disease

## 2017-04-30 NOTE — Telephone Encounter (Signed)
Scheduled patient 9/18 with Richardson Dopp to discuss discontinuing Plavix.  He was grateful for call and agrees with treatment plan.

## 2017-04-30 NOTE — Telephone Encounter (Signed)
New message       Pt c/o medication issue:  1. Name of Medication: generic plavix 2. How are you currently taking this medication (dosage and times per day)? 75mg  3. Are you having a reaction (difficulty breathing--STAT)?  no 4. What is your medication issue? Pt has been taking medication for 1 year.  Can he stop taking it?  Also, pt is having a colonoscopy and need to know if he can stop taking the plavix.  GI office should be sending clearance, but pt wanted to know since he is asking the stop plavix question.  Please call

## 2017-05-07 ENCOUNTER — Encounter: Payer: Self-pay | Admitting: Physician Assistant

## 2017-05-07 ENCOUNTER — Ambulatory Visit (INDEPENDENT_AMBULATORY_CARE_PROVIDER_SITE_OTHER): Payer: Medicare Other | Admitting: Physician Assistant

## 2017-05-07 VITALS — BP 130/50 | HR 70 | Ht 73.0 in | Wt 188.1 lb

## 2017-05-07 DIAGNOSIS — I2510 Atherosclerotic heart disease of native coronary artery without angina pectoris: Secondary | ICD-10-CM | POA: Diagnosis not present

## 2017-05-07 DIAGNOSIS — I10 Essential (primary) hypertension: Secondary | ICD-10-CM | POA: Diagnosis not present

## 2017-05-07 NOTE — Patient Instructions (Addendum)
Medication Instructions:  You can hold your Plavix for 7 days prior to your colonoscopy. I will check with Dr. Sherren Mocha and let you know if it is ok to stop it altogether.  Please remain on Aspirin. If possible, we would prefer you remain on Aspirin without stopping for your colonoscopy.  If the surgeon is concerned about a high risk of bleeding, the Aspirin can be held but should be resumed as soon as possible when felt to be safe.   Labwork: None   Testing/Procedures: None   Follow-Up:  Your physician wants you to follow-up in: 10/2017 Dr. Virl Axe in the [place] will receive a reminder letter in the mail two months in advance. If you don't receive a letter, please call our office to schedule the follow-up appointment.   Any Other Special Instructions Will Be Listed Below (If Applicable).  If you need a refill on your cardiac medications before your next appointment, please call your pharmacy.

## 2017-05-07 NOTE — Progress Notes (Signed)
Cardiology Office Note:    Date:  05/07/2017   ID:  Paul Choi, DOB 21-Mar-1946, MRN [id number]  PCP:  Adin Hector, MD  Cardiologist:  Dr. Virl Axe     Referring MD: Adin Hector, MD   Chief Complaint  Patient presents with  . Coronary Artery Disease    follow up    History of Present Illness:    Paul Choi is a 71 y.o. male with a hx of CAD with known total occlusion of the LCx by cardiac catheterization in 2003 treated medically, cardiomyopathy with prior EF 40%, moderate mitral regurgitation and bicuspid aortic valve by echocardiogram in 2013, HTN, HL. He established with Dr. Caryl Comes in 6/17 and underwent further evaluation for palpitations and PVCs. In his workup, a Cardiac CTA in 6/17 demonstrated functional bicuspid aortic valve with aneurysmal dilatation of the ascending aorta measuring 43 x 42 mm. There was also 70-99% stenosis in the proximal RCA, patent left main and LAD with mid stenosis less than 50% and an occluded LCx in the proximal vessel. He underwent a cardiac catheterization by Dr. Burt Knack and this demonstrated high-grade proximal RCA stenosis which was treated with a DES. He had residual mild to moderate nonobstructive disease in the LAD and ramus intermediate.  The LCx was occluded as previously documented with left to left collaterals.  Last seen by Dr. Caryl Comes 3/18. Follow-up echocardiogram demonstrated normal LV function with bicuspid aortic valve, stable aortic valve gradients, stable aortic root and ascending aortic dilatation and mild mitral regurgitation. Of note, Dr. Caryl Comes had discussed his case with Dr. Burt Knack who felt that 6 months of dual antiplatelet therapy was sufficient given the elective nature of his PCI. Apparently, at that visit, the patient had issues with hematuria and was to be seen by urology in the near future.  Paul Choi returns for follow-up. He is here alone. He plans to get scheduled for routine screening colonoscopy in next  several weeks. He will need to be off of Plavix for this. He came in today to discuss whether or not he could come off of Plavix permanently. He denies chest discomfort, shortness of breath, orthopnea, PND, syncope. He denies any bleeding issues. He does have mild pedal edema without significant change. His primary care doctor plans to recheck a urinalysis in the near future to see if he has continued hematuria.  Prior CV studies:   The following studies were reviewed today:  Echocardiogram 12/27/16 EF 50, normal wall motion, grade 1 diastolic dysfunction, functionally bicuspid aortic valve (mean 6, peak 12), dilated aortic root (40 mm), dilated ascending aorta (41 mm), mild MR, mild BAE  Carotid US 4/18 Bilateral <50  Cardiac Catheterization 05/04/16 LAD proximal 30, mid 30 RI 60 LCx proximal 100; OM1, OM2 and lateral marginal 2 all filled by collaterals from first septal perforator RCA proximal 95, mid 40 PCI: 3.5 x 16 mm Promus Premier DES to the proximal RCA   Nuclear stress test 04/11/16 Lateral and inferolateral defect predominantly fixed with small area of peri-infarct ischemia, EF 42, moderate to high risk scan  Coronary CTA 02/29/16 IMPRESSION: 1. Anatomically tricuspid, but functionally bicuspid aortic valve with co-joined left and right aortic leaflet and minimally restricted leaflet opening with no evidence of stenosis. 2. Mildly dilated aortic root with maximum diameter 40 mm. 3. 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 An annual follow up with CTA or MRA is recommended. No prior study is available  for comparison. 4. Coronary calcium score of 750. This was 4 percentile for age and sex matched control. 5. Diffuse mild to moderate CAD, with a severe mixed plaque in the proximal RCA associated with 70-99% stenosis. A stress test is Recommended.  Echocardiogram 02/10/16 EF 10-93, grade 1 diastolic dysfunction, trivial AI, AV gradient mean  6/peak 12, mildly dilated ascending aorta (41 mm), mild dilated aortic root (40 mm), normal RVSF  Holter 6/17 PVC burden 6.3%  Past Medical History:  Diagnosis Date  . Arteriosclerotic cardiovascular disease   . Arthritis 1997  . Coronary artery disease    Cardiac catheterization in August 2003 with occluded left circumflex. Echo 8/13 with mild segmental LV dysfunction, EF 40%, moderate mitral insufficiency and mild tricuspid insufficiency; aortic valve appears bicuspid  . 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 ED (erectile dysfunction)   . Gout   . Gout   . Heart disease   . Hyperlipidemia   . Hypertension 1982  . Insomnia   . Personal history of colonic polyps   . Renal insufficiency     Past Surgical History:  Procedure Laterality Date  . CARDIAC CATHETERIZATION  2006  . CARDIAC CATHETERIZATION N/A 05/04/2016   Procedure: Left Heart Cath and Coronary Angiography;  Surgeon: Sherren Mocha, MD;  Location: [facility];  Service: Cardiovascular;  Laterality: N/A;  . CARDIAC CATHETERIZATION N/A 05/04/2016   Procedure: Coronary Stent Intervention;  Surgeon: Sherren Mocha, MD;  Location: [facility];  Service: Cardiovascular;  Laterality: N/A;  . COLONOSCOPY  2007  . CORONARY STENT PLACEMENT  05/04/2016   proximal RCA treated successfully with coronary stenting using a 3.5 x 16 mm Promus drug-eluting stent  . CORONARY STENT PLACEMENT  05/04/2016   Mid LAD lesion, 30 %stenosed.  Marland Kitchen HERNIA REPAIR Left 08/23/2006   Direct inguinal hernia repair with a large ultra Pro mesh.  Marland Kitchen KNEE SURGERY  1988    Current Medications: Current Meds  Medication Sig  . allopurinol (ZYLOPRIM) 100 MG tablet Take 200 mg by mouth every evening.   Marland Kitchen amLODipine (NORVASC) 5 MG tablet Take 5 mg by mouth every evening.   Marland Kitchen aspirin EC 81 MG tablet Take 81 mg by mouth daily.  Marland Kitchen atorvastatin (LIPITOR) 40 MG tablet TAKE 1 TABLET BY MOUTH  DAILY  . cholecalciferol (VITAMIN D)  1000 units tablet Take 1,000 Units by mouth every morning.   . clopidogrel (PLAVIX) 75 MG tablet TAKE 1 TABLET BY MOUTH  EVERY EVENING  . colchicine 0.6 MG tablet Take 0.6 mg by mouth daily as needed (for gout flare ups.).   Marland Kitchen docusate sodium (COLACE) 100 MG capsule Take 200-300 mg by mouth daily as needed for mild constipation.  Marland Kitchen losartan (COZAAR) 100 MG tablet Take 1 tablet (100 mg total) by mouth every evening.  . polyethylene glycol (MIRALAX / GLYCOLAX) packet Take 17 g by mouth daily as needed for mild constipation.     Allergies:   Ace inhibitors; Contrast media [iodinated diagnostic agents]; Lovastatin; Other; Shellfish allergy; and Trazodone   Social History   Social History  . Marital status: Married    Spouse name: N/A  . Number of children: N/A  . Years of education: N/A   Social History Main Topics  . Smoking status: Former Smoker    Packs/day: 1.00    Years: 25.00    Quit date: 09/22/1995  . Smokeless tobacco: Never Used  . Alcohol use Yes  Comment: OCCASIONAL  . Drug use: No  . Sexual activity: Not Asked   Other Topics Concern  . None   Social History Narrative  . None     Family Hx: The patient's family history includes Hypertension in his brother and mother.  ROS:   Please see the history of present illness.    ROS All other systems reviewed and are negative.   EKGs/Labs/Other Test Reviewed:    EKG:  EKG is  ordered today.  The ekg ordered today demonstrates NSR, HR 70, normal axis, low voltage, PVCs, QTc 416 ms, similar to prior tracing  Recent Labs: 05/15/2016: BUN 29; Creatinine, Ser 1.55; Potassium 4.7; Sodium 136   Recent Lipid Panel No results found for: CHOL, TRIG, HDL, CHOLHDL, LDLCALC, LDLDIRECT  Physical Exam:    VS:  BP (!) 130/50   Pulse 70   Ht 6\' 1"  (1.854 m)   Wt 188 lb 1.9 oz (85.3 kg)   BMI 24.82 kg/m     Wt Readings from Last 3 Encounters:  05/07/17 188 lb 1.9 oz (85.3 kg)  11/15/16 196 lb 8 oz (89.1 kg)  11/08/16  195 lb (88.5 kg)     Physical Exam  Constitutional: He is oriented to person, place, and time. He appears well-developed and well-nourished. No distress.  HENT:  Head: Normocephalic and atraumatic.  Eyes: No scleral icterus.  Neck: No JVD present.  Cardiovascular: Normal rate.  An irregular rhythm present.  No murmur heard. Pulmonary/Chest: Effort normal. He has no rales.  Abdominal: Soft. There is no tenderness.  Musculoskeletal: He exhibits no edema.  Neurological: He is alert and oriented to person, place, and time.  Skin: Skin is warm and dry.  Psychiatric: He has a normal mood and affect.    ASSESSMENT:    1. Coronary artery disease involving native coronary artery of native heart without angina pectoris   2. Essential hypertension    PLAN:    In order of problems listed above:  1. Coronary artery disease involving native coronary artery of native heart without angina pectoris - Known chronic total occlusion of the LCx. He underwent PCI with a DES to the RCA in 9/17. He is currently doing well without angina. As he is more than 1 year out from his procedure, he can hold Plavix for at least one week prior to his planned colonoscopy. I would recommend that he remain on aspirin without interruption unless the bleeding risk is too great. I will discuss further with Dr. Burt Knack regarding whether not he can come off of Plavix permanently. Follow-up with Dr. Caryl Comes as planned.  2. Essential hypertension The patient's blood pressure is controlled on his current regimen.  Continue current therapy.     Dispo:  Return in about 6 months (around 11/04/2017) for Routine Follow Up w/ Dr. Caryl Comes.   Medication Adjustments/Labs and Tests Ordered: Current medicines are reviewed at length with the patient today.  Concerns regarding medicines are outlined above.  Tests Ordered: Orders Placed This Encounter  Procedures  . EKG 12-Lead   Medication Changes: No orders of the defined types were  placed in this encounter.   Signed, Richardson Dopp, PA-C  05/07/2017 5:13 PM    [facility] [address] [place], [place], [place]  [address] Phone: [phone]; Fax: [phone]

## 2017-05-08 ENCOUNTER — Ambulatory Visit (INDEPENDENT_AMBULATORY_CARE_PROVIDER_SITE_OTHER): Payer: Medicare Other | Admitting: General Surgery

## 2017-05-08 ENCOUNTER — Telehealth: Payer: Self-pay | Admitting: Physician Assistant

## 2017-05-08 ENCOUNTER — Encounter: Payer: Self-pay | Admitting: General Surgery

## 2017-05-08 VITALS — BP 162/84 | HR 62 | Resp 12 | Ht 73.0 in | Wt 188.0 lb

## 2017-05-08 DIAGNOSIS — I2510 Atherosclerotic heart disease of native coronary artery without angina pectoris: Secondary | ICD-10-CM | POA: Diagnosis not present

## 2017-05-08 DIAGNOSIS — Z86010 Personal history of colonic polyps: Secondary | ICD-10-CM | POA: Diagnosis not present

## 2017-05-08 NOTE — Progress Notes (Signed)
Patient ID: Paul Choi, male   DOB: 1946-04-01, 71 y.o.   MRN: [id number]  Chief Complaint  Patient presents with  . Colonoscopy    HPI Paul Choi is a 71 y.o. male.  Who presents for a colonoscopy discussion. The last colonoscopy was completed in  2013. Denies any gastrointestinal issues. Bowels move once a week with the use of miralax  and dulcolax.  Denies bleeding. He has increased his fiber intake and probiotics. The patient had an abnormal stress test in August 2017 and subsequently underwent cardiac catheterization and stenting with a drug-eluting stent restoration of normal flow.   He has recently seen his cardiologist, Dr Jolyn Nap and Dr Gerrit Halls, and was told to stop his Plavix today, continue aspirin.   HPI  Past Medical History:  Diagnosis Date  . Arteriosclerotic cardiovascular disease   . Arthritis 1997  . Coronary artery disease    Cardiac catheterization in August 2003 with occluded left circumflex. Echo 8/13 with mild segmental LV dysfunction, EF 40%, moderate mitral insufficiency and mild tricuspid insufficiency; aortic valve appears bicuspid  . Diabetes mellitus without complication ([place])    TYPE 2   . ED (erectile dysfunction)   . Gout   . Gout   . Heart disease   . Hyperlipidemia   . Hypertension 1982  . Insomnia   . Personal history of colonic polyps   . Renal insufficiency     Past Surgical History:  Procedure Laterality Date  . CARDIAC CATHETERIZATION  2006  . CARDIAC CATHETERIZATION N/A 05/04/2016   Procedure: Left Heart Cath and Coronary Angiography;  Surgeon: Sherren Mocha, MD;  Location: [facility];  Service: Cardiovascular;  Laterality: N/A;  . CARDIAC CATHETERIZATION N/A 05/04/2016   Procedure: Coronary Stent Intervention;  Surgeon: Sherren Mocha, MD;  Location: [facility];  Service: Cardiovascular;  Laterality: N/A;  . COLONOSCOPY  2007  . CORONARY STENT PLACEMENT  05/04/2016   proximal RCA treated successfully  with coronary stenting using a 3.5 x 16 mm Promus drug-eluting stent  . CORONARY STENT PLACEMENT  05/04/2016   Mid LAD lesion, 30 %stenosed.  Marland Kitchen HERNIA REPAIR Left 08/23/2006   Direct inguinal hernia repair with a large ultra Pro mesh.  Marland Kitchen KNEE SURGERY  1988    Family History  Problem Relation Age of Onset  . Hypertension Mother   . Hypertension Brother     Social History Social History  Substance Use Topics  . Smoking status: Former Smoker    Packs/day: 1.00    Years: 25.00    Quit date: 09/22/1995  . Smokeless tobacco: Never Used  . Alcohol use Yes     Comment: OCCASIONAL    Allergies  Allergen Reactions  . Ace Inhibitors Cough  . Contrast Media [Iodinated Diagnostic Agents] Other (See Comments)    Cough and difficulty breathing   . Lovastatin Other (See Comments)    Myalgia at 80-mg dose  . Other Nausea Only, Hives and Cough    Contrast media  Had difficulty breathing  . Shellfish Allergy Hives  . Trazodone Other (See Comments)    "felt like zombie"    Current Outpatient Prescriptions  Medication Sig Dispense Refill  . allopurinol (ZYLOPRIM) 100 MG tablet Take 200 mg by mouth every evening.     Marland Kitchen amLODipine (NORVASC) 5 MG tablet Take 5 mg by mouth every evening.     Marland Kitchen aspirin EC 81 MG tablet Take 81 mg by mouth daily.    Marland Kitchen  atorvastatin (LIPITOR) 40 MG tablet TAKE 1 TABLET BY MOUTH  DAILY 90 tablet 3  . cholecalciferol (VITAMIN D) 1000 units tablet Take 1,000 Units by mouth every morning.     . colchicine 0.6 MG tablet Take 0.6 mg by mouth daily as needed (for gout flare ups.).     Marland Kitchen docusate sodium (COLACE) 100 MG capsule Take 200-300 mg by mouth daily as needed for mild constipation.    Marland Kitchen losartan (COZAAR) 100 MG tablet Take 1 tablet (100 mg total) by mouth every evening.    . polyethylene glycol (MIRALAX / GLYCOLAX) packet Take 17 g by mouth daily as needed for mild constipation.     No current facility-administered medications for this visit.     Review of  Systems Review of Systems  Constitutional: Negative.   Respiratory: Negative.   Cardiovascular: Negative.   Gastrointestinal: Positive for constipation.    Blood pressure (!) 162/84, pulse 62, resp. rate 12, height 6\' 1"  (1.854 m), weight 188 lb (85.3 kg).  Physical Exam Physical Exam  Constitutional: He is oriented to person, place, and time. He appears well-developed and well-nourished.  HENT:  Mouth/Throat: Oropharynx is clear and moist.  Eyes: Conjunctivae are normal. No scleral icterus.  Neck: Neck supple.  Cardiovascular: Normal rate, regular rhythm and normal heart sounds.   Pulmonary/Chest: Effort normal and breath sounds normal.  Lymphadenopathy:    He has no cervical adenopathy.  Neurological: He is alert and oriented to person, place, and time.  Skin: Skin is warm and dry.  Psychiatric: His behavior is normal.    Data Reviewed Tubular adenoma from the ascending colon removed on 10/19/2011.  Assessment    Candidate for follow-up colonoscopy.    Plan    The patient's bowel movements have become a little more regular and for that reason we'll have him use Dulcolax day before miralax prep  Colonoscopy with possible biopsy/polypectomy prn: Information regarding the procedure, including its potential risks and complications (including but not limited to perforation of the bowel, which may require emergency surgery to repair, and bleeding) was verbally given to the patient. Educational information regarding lower intestinal endoscopy was given to the patient. Written instructions for how to complete the bowel prep using Miralax were provided. The importance of drinking ample fluids to avoid dehydration as a result of the prep emphasized.  [place] after [employer] this weekend, and the procedure will be scheduled after that a convenient date.  HPI, Physical Exam, Assessment and Plan have been scribed under the  direction and in the presence of Robert Bellow, MD. Karie Fetch, RN  I have completed the exam and reviewed the above documentation for accuracy and completeness.  I agree with the above.  Haematologist has been used and any errors in dictation or transcription are unintentional.  Hervey Ard, M.D., F.A.C.S.  Robert Bellow 05/10/2017, 8:20 PM  Patient has been scheduled for a colonoscopy on 06-26-17 at [facility]. Miralax prescription was not sent electronically because patient states he already has this at home. He will make use of Dulcolax two- 5 mg tablets the morning and afternoon day prior to colonoscopy prep. It is okay for patient to continue an 81 mg aspirin once daily. Colonoscopy instructions have been reviewed with the patient. This patient is aware to call the office if they have further questions.   Dominga Ferry, CMA

## 2017-05-08 NOTE — Telephone Encounter (Signed)
Patient made aware that per Richardson Dopp, PA and Dr. Burt Knack, it is okay to stop taking the plavix. Patient made aware that he should continue taking ASA. Patient verbalized understanding and thanked me for the call.

## 2017-05-08 NOTE — Telephone Encounter (Signed)
Please tell Mr. Andujo that I reviewed his case with Dr. Sherren Mocha. It is ok to stop Plavix. PLAN:  DC Plavix. Richardson Dopp, PA-C    05/08/2017 9:46 AM

## 2017-05-08 NOTE — Patient Instructions (Signed)
Colonoscopy, Adult A colonoscopy is an exam to look at the entire large intestine. During the exam, a lubricated, bendable tube is inserted into the anus and then passed into the rectum, colon, and other parts of the large intestine. A colonoscopy is often done as a part of normal colorectal screening or in response to certain symptoms, such as anemia, persistent diarrhea, abdominal pain, and blood in the stool. The exam can help screen for and diagnose medical problems, including:  Tumors.  Polyps.  Inflammation.  Areas of bleeding.  Tell a health care provider about:  Any allergies you have.  All medicines you are taking, including vitamins, herbs, eye drops, creams, and over-the-counter medicines.  Any problems you or family members have had with anesthetic medicines.  Any blood disorders you have.  Any surgeries you have had.  Any medical conditions you have.  Any problems you have had passing stool. What are the risks? Generally, this is a safe procedure. However, problems may occur, including:  Bleeding.  A tear in the intestine.  A reaction to medicines given during the exam.  Infection (rare).  What happens before the procedure? Eating and drinking restrictions Follow instructions from your health care provider about eating and drinking, which may include:  A few days before the procedure - follow a low-fiber diet. Avoid nuts, seeds, dried fruit, raw fruits, and vegetables.  1-3 days before the procedure - follow a clear liquid diet. Drink only clear liquids, such as clear broth or bouillon, black coffee or tea, clear juice, clear soft drinks or sports drinks, gelatin dessert, and popsicles. Avoid any liquids that contain red or purple dye.  On the day of the procedure - do not eat or drink anything during the 2 hours before the procedure, or within the time period that your health care provider recommends.  Bowel prep If you were prescribed an oral bowel prep  to clean out your colon:  Take it as told by your health care provider. Starting the day before your procedure, you will need to drink a large amount of medicated liquid. The liquid will cause you to have multiple loose stools until your stool is almost clear or light green.  If your skin or anus gets irritated from diarrhea, you may use these to relieve the irritation: ? Medicated wipes, such as adult wet wipes with aloe and vitamin E. ? A skin soothing-product like petroleum jelly.  If you vomit while drinking the bowel prep, take a break for up to 60 minutes and then begin the bowel prep again. If vomiting continues and you cannot take the bowel prep without vomiting, call your health care provider.  General instructions  Ask your health care provider about changing or stopping your regular medicines. This is especially important if you are taking diabetes medicines or blood thinners.  Plan to have someone take you home from the hospital or clinic. What happens during the procedure?  An IV tube may be inserted into one of your veins.  You will be given medicine to help you relax (sedative).  To reduce your risk of infection: ? Your health care team will wash or sanitize their hands. ? Your anal area will be washed with soap.  You will be asked to lie on your side with your knees bent.  Your health care provider will lubricate a long, thin, flexible tube. The tube will have a camera and a light on the end.  The tube will be inserted into your   anus.  The tube will be gently eased through your rectum and colon.  Air will be delivered into your colon to keep it open. You may feel some pressure or cramping.  The camera will be used to take images during the procedure.  A small tissue sample may be removed from your body to be examined under a microscope (biopsy). If any potential problems are found, the tissue will be sent to a lab for testing.  If small polyps are found, your  health care provider may remove them and have them checked for cancer cells.  The tube that was inserted into your anus will be slowly removed. The procedure may vary among health care providers and hospitals. What happens after the procedure?  Your blood pressure, heart rate, breathing rate, and blood oxygen level will be monitored until the medicines you were given have worn off.  Do not drive for 24 hours after the exam.  You may have a small amount of blood in your stool.  You may pass gas and have mild abdominal cramping or bloating due to the air that was used to inflate your colon during the exam.  It is up to you to get the results of your procedure. Ask your health care provider, or the department performing the procedure, when your results will be ready. This information is not intended to replace advice given to you by your health care provider. Make sure you discuss any questions you have with your health care provider. Document Released: 08/03/2000 Document Revised: 06/06/2016 Document Reviewed: 10/18/2015 Elsevier Interactive Patient Education  2018 [employer]  

## 2017-05-08 NOTE — Progress Notes (Signed)
Yes he can dc plavix per current guidelines. thanks

## 2017-05-24 ENCOUNTER — Other Ambulatory Visit: Payer: Self-pay | Admitting: Internal Medicine

## 2017-05-24 DIAGNOSIS — R3129 Other microscopic hematuria: Secondary | ICD-10-CM

## 2017-05-24 DIAGNOSIS — Z9889 Other specified postprocedural states: Secondary | ICD-10-CM

## 2017-06-03 ENCOUNTER — Ambulatory Visit
Admission: RE | Admit: 2017-06-03 | Discharge: 2017-06-03 | Disposition: A | Discharge status: Home / Self Care | Payer: Medicare Other | Source: Ambulatory Visit | Attending: Internal Medicine | Admitting: Internal Medicine

## 2017-06-03 DIAGNOSIS — N200 Calculus of kidney: Secondary | ICD-10-CM | POA: Diagnosis not present

## 2017-06-03 DIAGNOSIS — I700 Atherosclerosis of aorta: Secondary | ICD-10-CM | POA: Diagnosis not present

## 2017-06-03 DIAGNOSIS — R3129 Other microscopic hematuria: Secondary | ICD-10-CM | POA: Diagnosis not present

## 2017-06-03 DIAGNOSIS — Z9889 Other specified postprocedural states: Secondary | ICD-10-CM | POA: Diagnosis not present

## 2017-06-19 ENCOUNTER — Encounter: Payer: Self-pay | Admitting: *Deleted

## 2017-06-26 ENCOUNTER — Ambulatory Visit: Payer: Medicare Other | Admitting: Anesthesiology

## 2017-06-26 ENCOUNTER — Encounter
Admission: RE | Disposition: A | Discharge status: Home / Self Care | Payer: Self-pay | Source: Ambulatory Visit | Attending: General Surgery

## 2017-06-26 ENCOUNTER — Encounter: Payer: Self-pay | Admitting: Anesthesiology

## 2017-06-26 ENCOUNTER — Ambulatory Visit
Admission: RE | Admit: 2017-06-26 | Discharge: 2017-06-26 | Disposition: A | Discharge status: Home / Self Care | Payer: Medicare Other | Source: Ambulatory Visit | Attending: General Surgery | Admitting: General Surgery

## 2017-06-26 DIAGNOSIS — D122 Benign neoplasm of ascending colon: Secondary | ICD-10-CM | POA: Diagnosis not present

## 2017-06-26 DIAGNOSIS — E119 Type 2 diabetes mellitus without complications: Secondary | ICD-10-CM | POA: Insufficient documentation

## 2017-06-26 DIAGNOSIS — Z955 Presence of coronary angioplasty implant and graft: Secondary | ICD-10-CM | POA: Diagnosis not present

## 2017-06-26 DIAGNOSIS — M109 Gout, unspecified: Secondary | ICD-10-CM | POA: Diagnosis not present

## 2017-06-26 DIAGNOSIS — Z91041 Radiographic dye allergy status: Secondary | ICD-10-CM | POA: Insufficient documentation

## 2017-06-26 DIAGNOSIS — D123 Benign neoplasm of transverse colon: Secondary | ICD-10-CM | POA: Diagnosis not present

## 2017-06-26 DIAGNOSIS — Z79899 Other long term (current) drug therapy: Secondary | ICD-10-CM | POA: Insufficient documentation

## 2017-06-26 DIAGNOSIS — G4700 Insomnia, unspecified: Secondary | ICD-10-CM | POA: Insufficient documentation

## 2017-06-26 DIAGNOSIS — I340 Nonrheumatic mitral (valve) insufficiency: Secondary | ICD-10-CM | POA: Diagnosis not present

## 2017-06-26 DIAGNOSIS — I2510 Atherosclerotic heart disease of native coronary artery without angina pectoris: Secondary | ICD-10-CM | POA: Insufficient documentation

## 2017-06-26 DIAGNOSIS — I10 Essential (primary) hypertension: Secondary | ICD-10-CM | POA: Insufficient documentation

## 2017-06-26 DIAGNOSIS — Z86010 Personal history of colonic polyps: Secondary | ICD-10-CM | POA: Diagnosis not present

## 2017-06-26 DIAGNOSIS — Z7982 Long term (current) use of aspirin: Secondary | ICD-10-CM | POA: Insufficient documentation

## 2017-06-26 DIAGNOSIS — Z888 Allergy status to other drugs, medicaments and biological substances status: Secondary | ICD-10-CM | POA: Diagnosis not present

## 2017-06-26 DIAGNOSIS — E785 Hyperlipidemia, unspecified: Secondary | ICD-10-CM | POA: Insufficient documentation

## 2017-06-26 DIAGNOSIS — Z9889 Other specified postprocedural states: Secondary | ICD-10-CM | POA: Insufficient documentation

## 2017-06-26 DIAGNOSIS — Z1211 Encounter for screening for malignant neoplasm of colon: Secondary | ICD-10-CM | POA: Diagnosis not present

## 2017-06-26 DIAGNOSIS — Z91013 Allergy to seafood: Secondary | ICD-10-CM | POA: Diagnosis not present

## 2017-06-26 DIAGNOSIS — Z87891 Personal history of nicotine dependence: Secondary | ICD-10-CM | POA: Diagnosis not present

## 2017-06-26 HISTORY — PX: COLONOSCOPY WITH PROPOFOL: SHX5780

## 2017-06-26 SURGERY — COLONOSCOPY WITH PROPOFOL
Anesthesia: General

## 2017-06-26 MED ORDER — PROPOFOL 10 MG/ML IV BOLUS
INTRAVENOUS | Status: AC
Start: 2017-06-26 — End: ?
  Filled 2017-06-26: qty 20

## 2017-06-26 MED ORDER — PHENYLEPHRINE HCL 10 MG/ML IJ SOLN
INTRAMUSCULAR | Status: DC | PRN
Start: 2017-06-26 — End: 2017-06-26
  Administered 2017-06-26 (×5): 200 ug via INTRAVENOUS
  Administered 2017-06-26: 300 ug via INTRAVENOUS
  Administered 2017-06-26: 200 ug via INTRAVENOUS
  Administered 2017-06-26: 300 ug via INTRAVENOUS
  Administered 2017-06-26 (×2): 200 ug via INTRAVENOUS
  Administered 2017-06-26: 300 ug via INTRAVENOUS

## 2017-06-26 MED ORDER — PROPOFOL 10 MG/ML IV BOLUS
INTRAVENOUS | Status: AC
  Filled 2017-06-26: qty 20

## 2017-06-26 MED ORDER — PROPOFOL 500 MG/50ML IV EMUL
INTRAVENOUS | Status: DC | PRN
  Administered 2017-06-26: 140 ug/kg/min via INTRAVENOUS

## 2017-06-26 MED ORDER — SODIUM CHLORIDE 0.9 % IV SOLN
INTRAVENOUS | Status: DC
  Administered 2017-06-26: 1000 mL via INTRAVENOUS

## 2017-06-26 MED ORDER — GLYCOPYRROLATE 0.2 MG/ML IV SOSY
PREFILLED_SYRINGE | INTRAVENOUS | Status: DC | PRN
  Administered 2017-06-26: .2 mg via INTRAVENOUS

## 2017-06-26 MED ORDER — PROPOFOL 10 MG/ML IV BOLUS
INTRAVENOUS | Status: DC | PRN
  Administered 2017-06-26: 100 mg via INTRAVENOUS
  Administered 2017-06-26 (×2): 30 mg via INTRAVENOUS

## 2017-06-26 MED ORDER — PROPOFOL 500 MG/50ML IV EMUL
INTRAVENOUS | Status: AC
  Filled 2017-06-26: qty 50

## 2017-06-26 MED ORDER — GLYCOPYRROLATE 0.2 MG/ML IJ SOLN
INTRAMUSCULAR | Status: AC
  Filled 2017-06-26: qty 1

## 2017-06-26 NOTE — Anesthesia Preprocedure Evaluation (Addendum)
Anesthesia Evaluation  Patient identified by MRN, date of birth, ID band Patient awake    Reviewed: Allergy & Precautions, H&P , NPO status , Patient's Chart, lab work & pertinent test results  History of Anesthesia Complications Negative for: history of anesthetic complications  Airway Mallampati: III  TM Distance: <3 FB Neck ROM: limited    Dental  (+) Chipped, Poor Dentition, Missing, Edentulous Upper, Upper Dentures   Pulmonary neg shortness of breath, former smoker,           Cardiovascular Exercise Tolerance: Good hypertension, (-) angina+ CAD and + Cardiac Stents  (-) Past MI and (-) DOE + dysrhythmias Atrial Fibrillation      Neuro/Psych negative neurological ROS  negative psych ROS   GI/Hepatic negative GI ROS, Neg liver ROS, neg GERD  ,  Endo/Other  diabetes, Type 2  Renal/GU CRFRenal disease  negative genitourinary   Musculoskeletal  (+) Arthritis ,   Abdominal   Peds  Hematology negative hematology ROS (+)   Anesthesia Other Findings Past Medical History: No date: Arteriosclerotic cardiovascular disease 1997: Arthritis No date: Coronary artery disease     Comment:  Cardiac catheterization in August 2003 with occluded               left circumflex. Echo 8/13 with mild segmental LV               dysfunction, EF 40%, moderate mitral insufficiency and               mild tricuspid insufficiency; aortic valve appears               bicuspid No date: Diabetes mellitus without complication (HCC)     Comment:  TYPE 2  No date: ED (erectile dysfunction) No date: Gout No date: Gout No date: Heart disease No date: Hyperlipidemia 1982: Hypertension No date: Insomnia No date: Personal history of colonic polyps No date: Renal insufficiency  Past Surgical History: 2006: CARDIAC CATHETERIZATION 2007: COLONOSCOPY 05/04/2016: CORONARY STENT PLACEMENT     Comment:  proximal RCA treated successfully with  coronary stenting              using a 3.5 x 16 mm Promus drug-eluting stent 05/04/2016: CORONARY STENT PLACEMENT     Comment:  Mid LAD lesion, 30 %stenosed. 08/23/2006: HERNIA REPAIR; Left     Comment:  Direct inguinal hernia repair with a large ultra Pro               mesh. 1988: KNEE SURGERY  BMI    Body Mass Index:  24.68 kg/m      Reproductive/Obstetrics negative OB ROS                            Anesthesia Physical Anesthesia Plan  ASA: III  Anesthesia Plan: General   Post-op Pain Management:    Induction: Intravenous  PONV Risk Score and Plan: 2 and Propofol infusion  Airway Management Planned: Natural Airway and Nasal Cannula  Additional Equipment:   Intra-op Plan:   Post-operative Plan:   Informed Consent: I have reviewed the patients History and Physical, chart, labs and discussed the procedure including the risks, benefits and alternatives for the proposed anesthesia with the patient or authorized representative who has indicated his/her understanding and acceptance.   Dental Advisory Given  Plan Discussed with: Anesthesiologist, CRNA and Surgeon  Anesthesia Plan Comments: (Patient consented for risks of anesthesia including but not limited to:  -  adverse reactions to medications - risk of intubation if required - damage to teeth, lips or other oral mucosa - sore throat or hoarseness - Damage to heart, brain, lungs or loss of life  Patient voiced understanding.)        Anesthesia Quick Evaluation

## 2017-06-26 NOTE — Anesthesia Postprocedure Evaluation (Signed)
Anesthesia Post Note  Patient: DENI LEFEVER  Procedure(s) Performed: COLONOSCOPY WITH PROPOFOL (N/A )  Patient location during evaluation: Endoscopy Anesthesia Type: General Level of consciousness: awake and alert Pain management: pain level controlled Vital Signs Assessment: post-procedure vital signs reviewed and stable Respiratory status: spontaneous breathing, nonlabored ventilation, respiratory function stable and patient connected to nasal cannula oxygen Cardiovascular status: blood pressure returned to baseline and stable Postop Assessment: no apparent nausea or vomiting Anesthetic complications: no     Last Vitals:  Vitals:   06/26/17 1135 06/26/17 1145  BP: 135/74   Pulse: 93   Resp:    Temp:    SpO2: 99% 98%    Last Pain:  Vitals:   06/26/17 1135  TempSrc: Tympanic                 Precious Haws Ras Kollman

## 2017-06-26 NOTE — Transfer of Care (Signed)
Immediate Anesthesia Transfer of Care Note  Patient: Paul Choi  Procedure(s) Performed: COLONOSCOPY WITH PROPOFOL (N/A )  Patient Location: Endoscopy Unit  Anesthesia Type:General  Level of Consciousness: sedated  Airway & Oxygen Therapy: Patient Spontanous Breathing and Patient connected to nasal cannula oxygen  Post-op Assessment: Report given to RN and Post -op Vital signs reviewed and stable  Post vital signs: Reviewed and stable  Last Vitals:  Vitals:   06/26/17 0944 06/26/17 1125  BP: (!) 155/101 (!) 113/54  Pulse: 90 79  Resp: 16 12  Temp: (!) 35.6 C (!) 36 C  SpO2: 100% 100%    Last Pain:  Vitals:   06/26/17 1125  TempSrc: Tympanic         Complications: No apparent anesthesia complications

## 2017-06-26 NOTE — Anesthesia Post-op Follow-up Note (Signed)
Anesthesia QCDR form completed.        

## 2017-06-26 NOTE — Op Note (Signed)
[facility] Gastroenterology Patient Name: Paul Choi Procedure Date: 06/26/2017 10:07 AM MRN: [id number] Account #: [id number] Date of Birth: [birth year] Admit Type: Outpatient Age: 71 Room: [facility] ENDO ROOM 1 Gender: Male Note Status: Finalized Procedure:            Colonoscopy Indications:          High risk colon cancer surveillance: Personal history                        of colonic polyps Providers:            Robert Bellow, MD Referring MD:         Ramonita Lab, MD (Referring MD) Medicines:            Monitored Anesthesia Care Complications:        No immediate complications. Procedure:            Pre-Anesthesia Assessment:                       - Prior to the procedure, a History and Physical was                        performed, and patient medications, allergies and                        sensitivities were reviewed. The patient's tolerance of                        previous anesthesia was reviewed.                       - The risks and benefits of the procedure and the                        sedation options and risks were discussed with the                        patient. All questions were answered and informed                        consent was obtained.                       After obtaining informed consent, the colonoscope was                        passed under direct vision. Throughout the procedure,                        the patient's blood pressure, pulse, and oxygen                        saturations were monitored continuously. The                        Colonoscope was introduced through the anus and                        advanced to the the cecum, identified by appendiceal  orifice and ileocecal valve. The colonoscopy was                        extremely difficult due to significant looping and a                        tortuous colon. Successful completion of the procedure                        was aided by  changing the patient to a supine position.                        The patient tolerated the procedure well. The quality                        of the bowel preparation was excellent.                       The abdominal wall was very lax, and the colon "floppy"                        and tortuous.                       An abdominal binder may be beneficial for his next exam. Findings:      A 5 mm polyp was found in the mid ascending colon. The polyp was       sessile. Biopsies were taken with a cold forceps for histology.      Three sessile polyps were found in the hepatic flexure. The polyps were       5 mm in size. These were biopsied with a cold forceps for histology.      The retroflexed view of the distal rectum and anal verge was normal and       showed no anal or rectal abnormalities. Impression:           - One 5 mm polyp in the mid ascending colon. Biopsied.                       - Three 5 mm polyps at the hepatic flexure. Biopsied.                       - The distal rectum and anal verge are normal on                        retroflexion view. Recommendation:       - Telephone endoscopist for pathology results in 1 week. Procedure Code(s):    --- Professional ---                       [id number], Colonoscopy, flexible; with biopsy, single or                        multiple Diagnosis Code(s):    --- Professional ---                       D12.3, Benign neoplasm of transverse colon (hepatic  flexure or splenic flexure)                       D12.2, Benign neoplasm of ascending colon                       Z86.010, Personal history of colonic polyps CPT copyright 2016 American Medical Association. All rights reserved. The codes documented in this report are preliminary and upon coder review may  be revised to meet current compliance requirements. Robert Bellow, MD 06/26/2017 11:25:10 AM This report has been signed electronically. Number of Addenda: 0 Note  Initiated On: 06/26/2017 10:07 AM Scope Withdrawal Time: 0 hours 16 minutes 11 seconds  Total Procedure Duration: 1 hour 0 minutes 35 seconds       [facility]

## 2017-06-26 NOTE — H&P (Signed)
Paul Choi [id number] 1945-11-05     HPI:  Healthy male s/p tubular adenoma excision in 2013 from the ascending colon. No GI issues at the present. Tolerated the prep well.  Off Plavix, still on pediatric ASA. Tolerated prep well.  For f/u colonoscopy.   Medications Prior to Admission  Medication Sig Dispense Refill Last Dose  . allopurinol (ZYLOPRIM) 100 MG tablet Take 200 mg by mouth every evening.    06/25/2017 at Unknown time  . amLODipine (NORVASC) 5 MG tablet Take 5 mg by mouth every evening.    06/25/2017 at Unknown time  . aspirin EC 81 MG tablet Take 81 mg by mouth daily.   Past Week at Unknown time  . atorvastatin (LIPITOR) 40 MG tablet TAKE 1 TABLET BY MOUTH  DAILY 90 tablet 3 06/25/2017 at Unknown time  . cholecalciferol (VITAMIN D) 1000 units tablet Take 1,000 Units by mouth every morning.    Past Week at Unknown time  . colchicine 0.6 MG tablet Take 0.6 mg by mouth daily as needed (for gout flare ups.).    Past Week at Unknown time  . docusate sodium (COLACE) 100 MG capsule Take 200-300 mg by mouth daily as needed for mild constipation.   Past Week at Unknown time  . losartan (COZAAR) 100 MG tablet Take 1 tablet (100 mg total) by mouth every evening.   06/25/2017 at Unknown time  . polyethylene glycol (MIRALAX / GLYCOLAX) packet Take 17 g by mouth daily as needed for mild constipation.   Past Week at Unknown time   Allergies  Allergen Reactions  . Ace Inhibitors Cough  . Contrast Media [Iodinated Diagnostic Agents] Other (See Comments)    Cough and difficulty breathing   . Lovastatin Other (See Comments)    Myalgia at 80-mg dose  . Other Nausea Only, Hives and Cough    Contrast media  Had difficulty breathing  . Shellfish Allergy Hives  . Trazodone Other (See Comments)    "felt like zombie"   Past Medical History:  Diagnosis Date  . Arteriosclerotic cardiovascular disease   . Arthritis 1997  . Coronary artery disease    Cardiac catheterization in August 2003 with  occluded left circumflex. Echo 8/13 with mild segmental LV dysfunction, EF 40%, moderate mitral insufficiency and mild tricuspid insufficiency; aortic valve appears bicuspid  . Diabetes mellitus without complication ([place])    TYPE 2   . ED (erectile dysfunction)   . Gout   . Gout   . Heart disease   . Hyperlipidemia   . Hypertension 1982  . Insomnia   . Personal history of colonic polyps   . Renal insufficiency    Past Surgical History:  Procedure Laterality Date  . CARDIAC CATHETERIZATION  2006  . COLONOSCOPY  2007  . CORONARY STENT PLACEMENT  05/04/2016   proximal RCA treated successfully with coronary stenting using a 3.5 x 16 mm Promus drug-eluting stent  . CORONARY STENT PLACEMENT  05/04/2016   Mid LAD lesion, 30 %stenosed.  Marland Kitchen HERNIA REPAIR Left 08/23/2006   Direct inguinal hernia repair with a large ultra Pro mesh.  Marland Kitchen KNEE SURGERY  1988   Social History   Socioeconomic History  . Marital status: Married    Spouse name: Not on file  . Number of children: Not on file  . Years of education: Not on file  . Highest education level: Not on file  Social Needs  . Financial resource strain: Not on file  . Food insecurity -  worry: Not on file  . Food insecurity - inability: Not on file  . Transportation needs - medical: Not on file  . Transportation needs - non-medical: Not on file  Occupational History  . Not on file  Tobacco Use  . Smoking status: Former Smoker    Packs/day: 1.00    Years: 25.00    Pack years: 25.00    Last attempt to quit: 09/22/1995    Years since quitting: 21.7  . Smokeless tobacco: Never Used  Substance and Sexual Activity  . Alcohol use: Yes    Comment: OCCASIONAL  . Drug use: No  . Sexual activity: Not on file  Other Topics Concern  . Not on file  Social History Narrative  . Not on file   Social History   Social History Narrative  . Not on file     ROS: Negative.     PE: HEENT: Negative. Lungs: Clear. Cardio:  RR.  Assessment/Plan:  Proceed with planned endoscopy.  Robert Bellow 06/26/2017

## 2017-06-27 ENCOUNTER — Encounter: Payer: Self-pay | Admitting: General Surgery

## 2017-06-27 LAB — SURGICAL PATHOLOGY

## 2017-06-28 ENCOUNTER — Telehealth: Payer: Self-pay | Admitting: *Deleted

## 2017-06-28 NOTE — Telephone Encounter (Signed)
-----   Message from Robert Bellow, MD sent at 06/28/2017 10:38 AM EST ----- Please notify the patient that all polyps were benign. He did have three small polyps that the guidelines recommend a follow up exam in three years.  Please put in recalls. Thanks. ----- Message ----- From: Interface, Lab In Three Zero One Sent: 06/27/2017   5:04 PM To: Robert Bellow, MD

## 2017-06-28 NOTE — Telephone Encounter (Signed)
Notified patient as instructed, patient pleased. Discussed follow-up appointments, patient agrees  

## 2017-09-27 DIAGNOSIS — D239 Other benign neoplasm of skin, unspecified: Secondary | ICD-10-CM

## 2017-09-27 HISTORY — DX: Other benign neoplasm of skin, unspecified: D23.9

## 2018-01-23 ENCOUNTER — Other Ambulatory Visit: Payer: Self-pay

## 2018-01-23 ENCOUNTER — Ambulatory Visit (INDEPENDENT_AMBULATORY_CARE_PROVIDER_SITE_OTHER): Payer: Medicare Other

## 2018-01-23 DIAGNOSIS — I77819 Aortic ectasia, unspecified site: Secondary | ICD-10-CM

## 2018-01-23 DIAGNOSIS — I350 Nonrheumatic aortic (valve) stenosis: Secondary | ICD-10-CM

## 2018-02-06 ENCOUNTER — Other Ambulatory Visit: Payer: Self-pay | Admitting: Internal Medicine

## 2018-03-13 ENCOUNTER — Encounter: Payer: Self-pay | Admitting: Internal Medicine

## 2018-03-13 ENCOUNTER — Ambulatory Visit (INDEPENDENT_AMBULATORY_CARE_PROVIDER_SITE_OTHER): Payer: Medicare Other | Admitting: Internal Medicine

## 2018-03-13 VITALS — BP 150/70 | HR 72 | Ht 72.0 in | Wt 187.0 lb

## 2018-03-13 DIAGNOSIS — R002 Palpitations: Secondary | ICD-10-CM | POA: Diagnosis not present

## 2018-03-13 DIAGNOSIS — I340 Nonrheumatic mitral (valve) insufficiency: Secondary | ICD-10-CM

## 2018-03-13 DIAGNOSIS — I359 Nonrheumatic aortic valve disorder, unspecified: Secondary | ICD-10-CM | POA: Diagnosis not present

## 2018-03-13 DIAGNOSIS — I493 Ventricular premature depolarization: Secondary | ICD-10-CM | POA: Diagnosis not present

## 2018-03-13 NOTE — Progress Notes (Signed)
Patient Care Team: Adin Hector, MD as PCP - General (Internal Medicine) Bary Castilla Forest Gleason, MD (General Surgery)   HPI  Paul Choi is a 72 y.o. male Seen in followup for PVCs and bicuspid aortic valve; he is also status post PCI 9/17 as noted below.     Given the absence of symptoms, he was referred for preeval with Dr [facility] who after exhaustive evaluation,  LHC 9/17 >>   1. Critical stenosis of the proximal RCA treated successfully with coronary stenting using a 3.5 x 16 mm Promus drug-eluting stent 2. Chronic occlusion of the mid circumflex with the first OM branch collateralized from the LAD and right coronary artery 3. Moderate stenosis of a large ramus intermedius branch 4. Diffuse nonobstructive stenosis of the LAD 5. Normal LV function by noninvasive assessment  DATE TEST EF   5/18 Echo   45-+50 % Ao 4.0/4.1No stenosis  6/19 Echo   55-60 % Ao 41/42 No stenosis        Date Cr K Hgb LDL  9/17 1.55 4.7    3/19 1.4 4.9 14.1 77    feels great  .The patient denies chest pain, shortness of breath, nocturnal dyspnea, orthopnea; mild  peripheral edema.  There have been no palpitations, lightheadedness or syncope.   Has lost weight ??   Past Medical History:  Diagnosis Date  . Arteriosclerotic cardiovascular disease   . Arthritis 1997  . Coronary artery disease    Cardiac catheterization in August 2003 with occluded left circumflex. Echo 8/13 with mild segmental LV dysfunction, EF 40%, moderate mitral insufficiency and mild tricuspid insufficiency; aortic valve appears bicuspid  . Diabetes mellitus without complication ([place])    TYPE 2   . ED (erectile dysfunction)   . Gout   . Gout   . Heart disease   . Hyperlipidemia   . Hypertension 1982  . Insomnia   . Personal history of colonic polyps   . Renal insufficiency     Past Surgical History:  Procedure Laterality Date  . CARDIAC CATHETERIZATION  2006  . CARDIAC CATHETERIZATION N/A 05/04/2016   Procedure: Left Heart Cath and Coronary Angiography;  Surgeon: Sherren Mocha, MD;  Location: [place] CV LAB;  Service: Cardiovascular;  Laterality: N/A;  . CARDIAC CATHETERIZATION N/A 05/04/2016   Procedure: Coronary Stent Intervention;  Surgeon: Sherren Mocha, MD;  Location: [place] CV LAB;  Service: Cardiovascular;  Laterality: N/A;  . COLONOSCOPY  2007  . COLONOSCOPY WITH PROPOFOL N/A 06/26/2017   Procedure: COLONOSCOPY WITH PROPOFOL;  Surgeon: Robert Bellow, MD;  Location: [facility] ENDOSCOPY;  Service: Endoscopy;  Laterality: N/A;  . CORONARY STENT PLACEMENT  05/04/2016   proximal RCA treated successfully with coronary stenting using a 3.5 x 16 mm Promus drug-eluting stent  . CORONARY STENT PLACEMENT  05/04/2016   Mid LAD lesion, 30 %stenosed.  Marland Kitchen HERNIA REPAIR Left 08/23/2006   Direct inguinal hernia repair with a large ultra Pro mesh.  Marland Kitchen KNEE SURGERY  1988    Current Outpatient Medications  Medication Sig Dispense Refill  . allopurinol (ZYLOPRIM) 100 MG tablet Take 200 mg by mouth every evening.     Marland Kitchen amLODipine (NORVASC) 5 MG tablet Take 5 mg by mouth every evening.     Marland Kitchen aspirin EC 81 MG tablet Take 81 mg by mouth daily.    Marland Kitchen atorvastatin (LIPITOR) 40 MG tablet Take 1 tablet (40 mg total) by mouth daily. Please make appt for future refills.  90 tablet 0  . cholecalciferol (VITAMIN D) 1000 units tablet Take 1,000 Units by mouth every morning.     . colchicine 0.6 MG tablet Take 0.6 mg by mouth daily as needed (for gout flare ups.).     Marland Kitchen docusate sodium (COLACE) 100 MG capsule Take 200-300 mg by mouth daily as needed for mild constipation.    Marland Kitchen losartan (COZAAR) 100 MG tablet Take 1 tablet (100 mg total) by mouth every evening.     No current facility-administered medications for this visit.     Allergies  Allergen Reactions  . Ace Inhibitors Cough  . Contrast Media [Iodinated Diagnostic Agents] Other (See Comments)    Cough and difficulty breathing   . Lovastatin  Other (See Comments)    Myalgia at 80-mg dose  . Other Nausea Only, Hives and Cough    Contrast media  Had difficulty breathing  . Shellfish Allergy Hives  . Trazodone Other (See Comments)    "felt like zombie"      Review of Systems negative except from HPI and PMH  Physical Exam BP (!) 150/70 (BP Location: Left Arm, Patient Position: Sitting, Cuff Size: Normal)   Pulse 72   Ht 6' (1.829 m)   Wt 187 lb (84.8 kg)   BMI 25.36 kg/m  Well developed and nourished in no acute distress HENT normal Neck supple with JVP-flat Clear Regular rate and rhythm, no murmurs or gallops Abd-soft with active BS No Clubbing cyanosis 1+ edema Skin-warm and dry A & Oriented  Grossly normal sensory and motor function  ECG personally reviewedsinus @ 72 19/09/39 PVC freq LBBB normal axis  Assessment and  Plan  Palpitations-PVC   Coronary artery disease with an occluded circumflex, left-right collaterals 2003  High-grade RCA stenosis status post DES   Cardiomyopathy  Resolved  >>>>>>Mitral regurgitation-moderate>>mild  Bicuspid aortic valve/mild aortic root dilitation   Hypertension  Aortic root basically stable; bicuspid valve without stenosis    BP well controlled at home.  BB have not been shown to be beneficial UPTODATE  PVCs are asymptomatic-- if anything LV is better  No therapy  LDL is above goal-- will ask Dr Roney Marion to consider increasing atorva  Edema may be 2/2 amlodipine but not bothersome    .

## 2018-03-13 NOTE — Patient Instructions (Signed)
Medication Instructions: - Your physician recommends that you continue on your current medications as directed. Please refer to the Current Medication list given to you today.  Labwork: - none ordered  Procedures/Testing: - Your physician has requested that you have an echocardiogram in 1 year (just prior to your follow up with Dr. Caryl Comes). Echocardiography is a painless test that uses sound waves to create images of your heart. It provides your doctor with information about the size and shape of your heart and how well your heart's chambers and valves are working. This procedure takes approximately one hour. There are no restrictions for this procedure.  Follow-Up: - Your physician wants you to follow-up in: 1 year with Dr. Caryl Comes. You will receive a reminder letter in the mail two months in advance. If you don't receive a letter, please call our office to schedule the follow-up appointment.    Any Additional Special Instructions Will Be Listed Below (If Applicable).     If you need a refill on your cardiac medications before your next appointment, please call your pharmacy.

## 2018-05-09 ENCOUNTER — Other Ambulatory Visit: Payer: Self-pay | Admitting: Internal Medicine

## 2019-02-14 IMAGING — CT CT ABD-PELV W/O CM
1 of 2 series · 15 of 32 positions shown, 19 images · non-contrast
Comparison: None.

CLINICAL DATA: Microscopic hematuria.

EXAM:
CT ABDOMEN AND PELVIS WITHOUT CONTRAST
TECHNIQUE: Multidetector CT imaging of the abdomen and pelvis was performed
following the standard protocol without IV contrast.

[Series 2: axial st · axial · 0.72mm/px · z∈[-905,-500]mm · 15 of 89 slices shown, 19 images]
[im 4/89  soft-tissue]
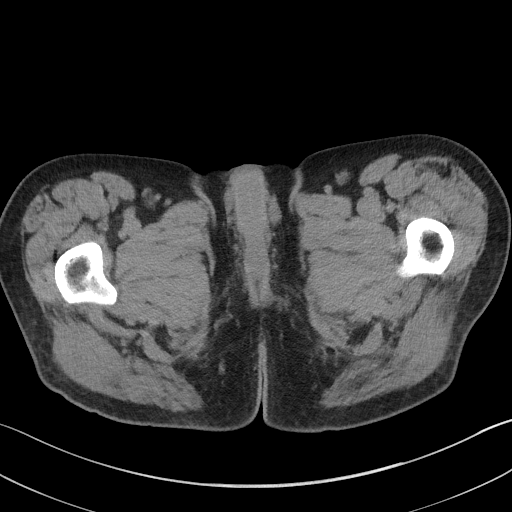
[im 4/89  bone]
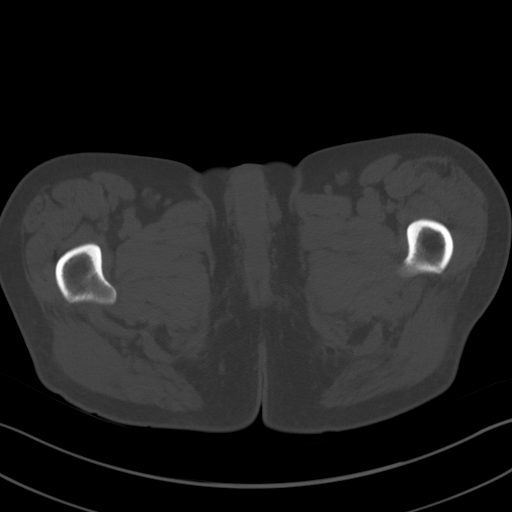
[im 11/89  soft-tissue]
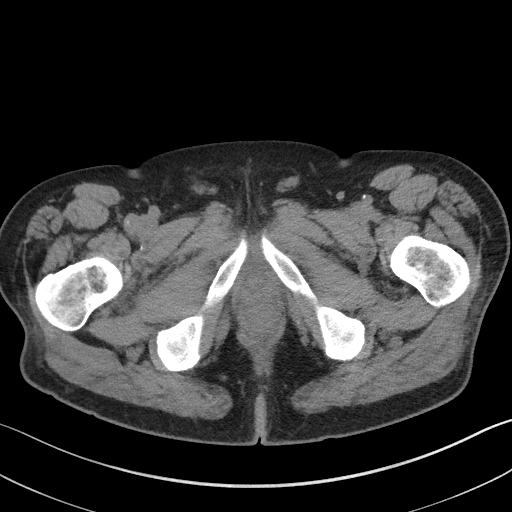
[im 18/89  soft-tissue]
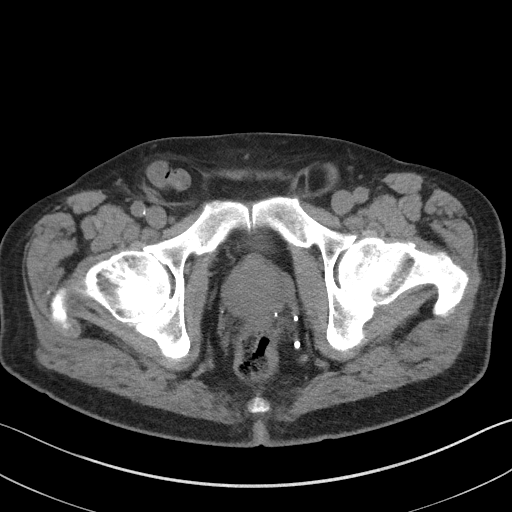
[im 25/89  soft-tissue]
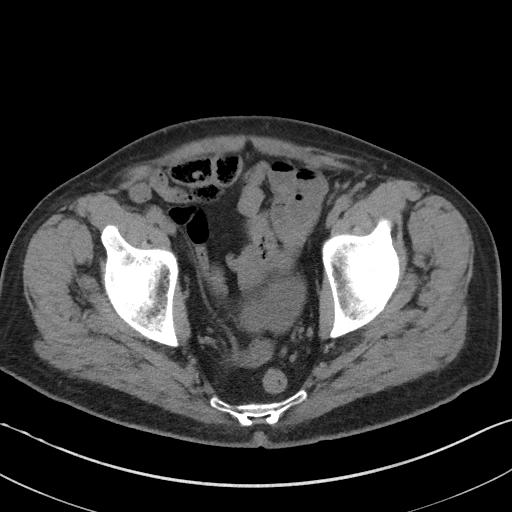
[im 32/89  soft-tissue]
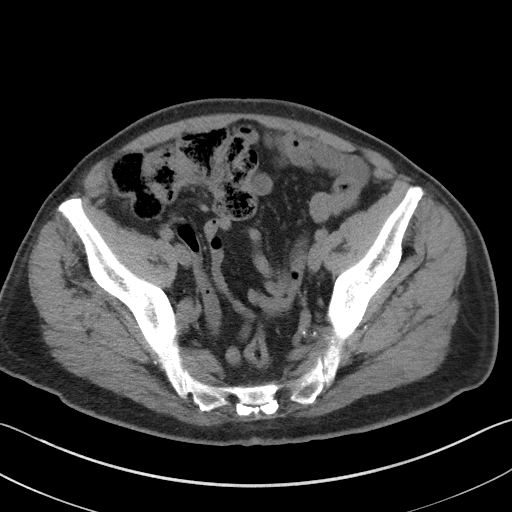
[im 39/89  soft-tissue]
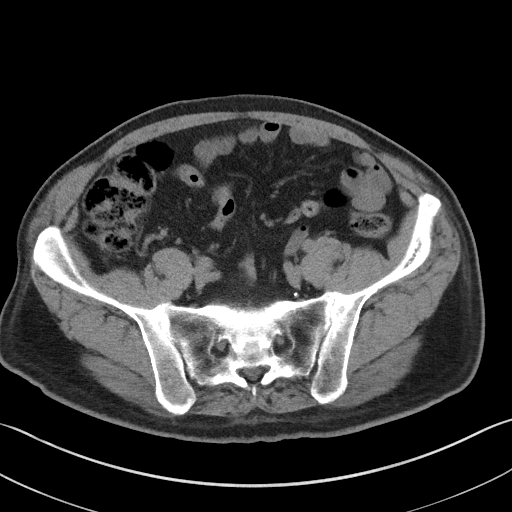
[im 46/89  soft-tissue]
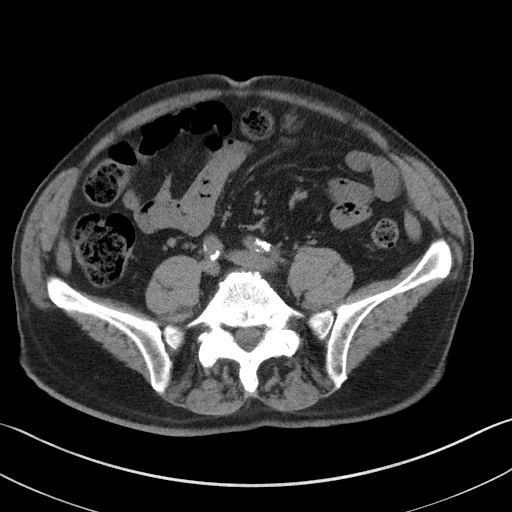
[im 50/89  soft-tissue]
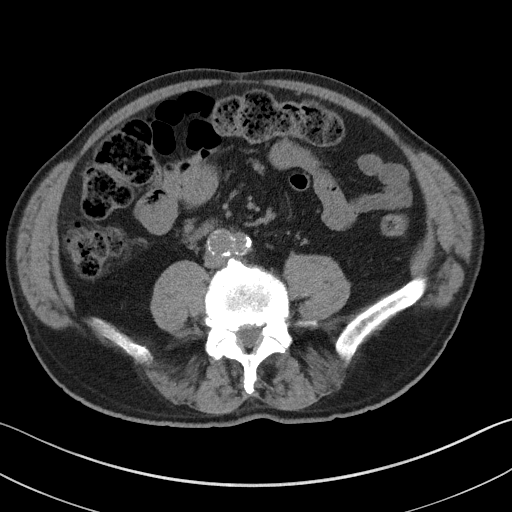
[im 57/89  soft-tissue]
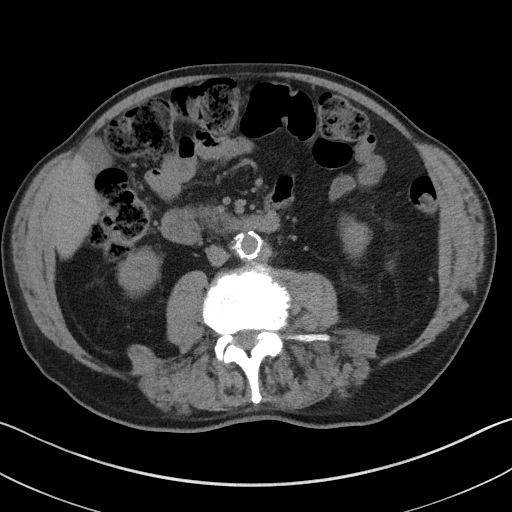
[im 57/89  bone]
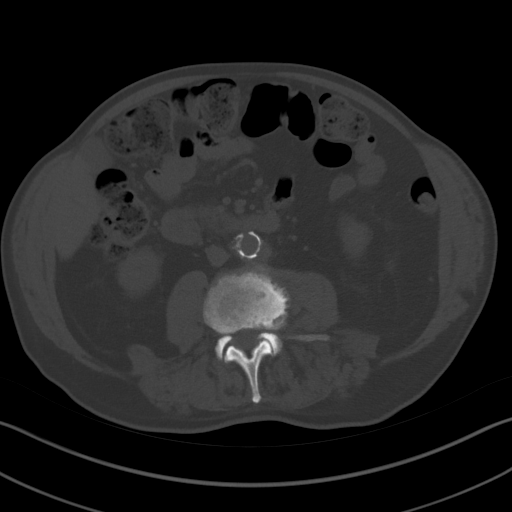
[im 64/89  soft-tissue]
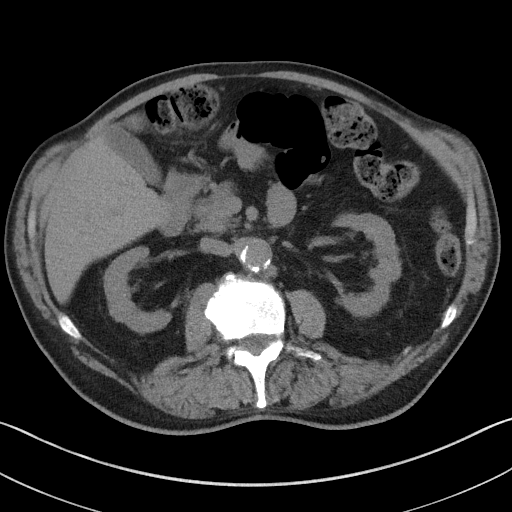
[im 71/89  soft-tissue]
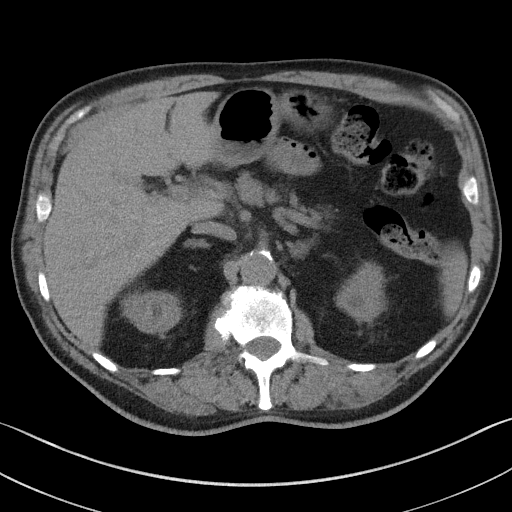
[im 74/89  lung]
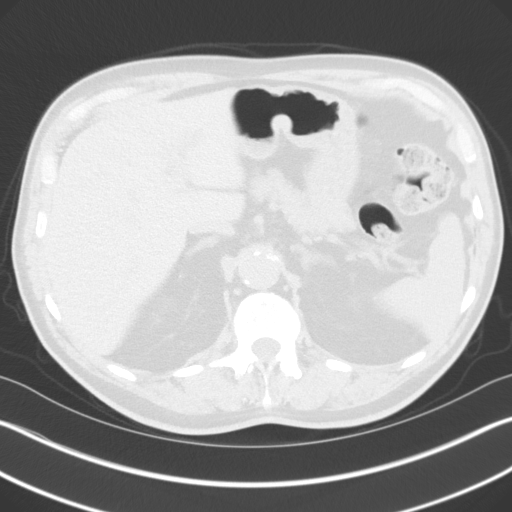
[im 78/89  soft-tissue]
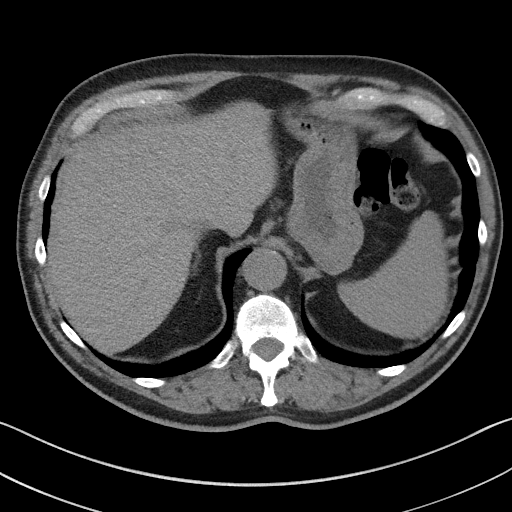
[im 78/89  lung]
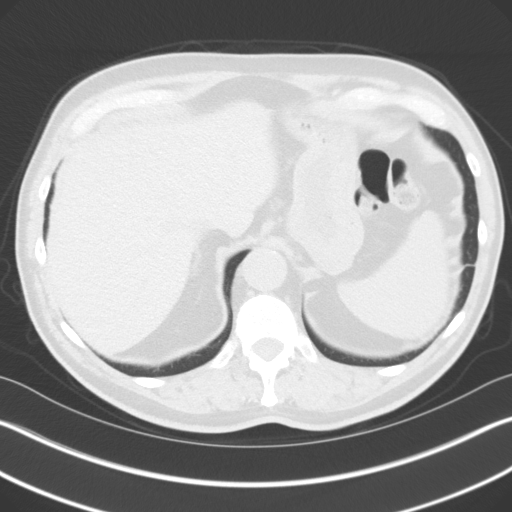
[im 81/89  lung]
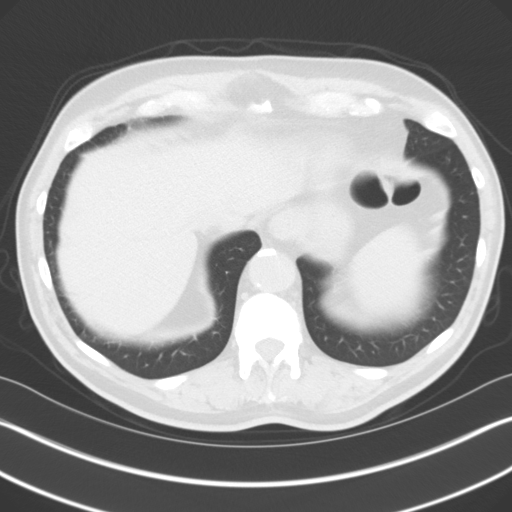
[im 85/89  soft-tissue]
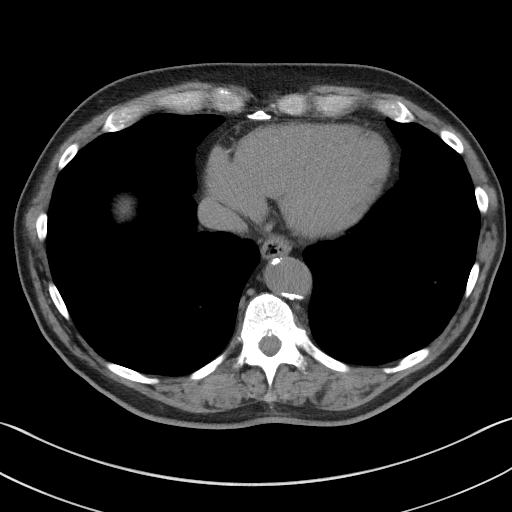
[im 85/89  lung]
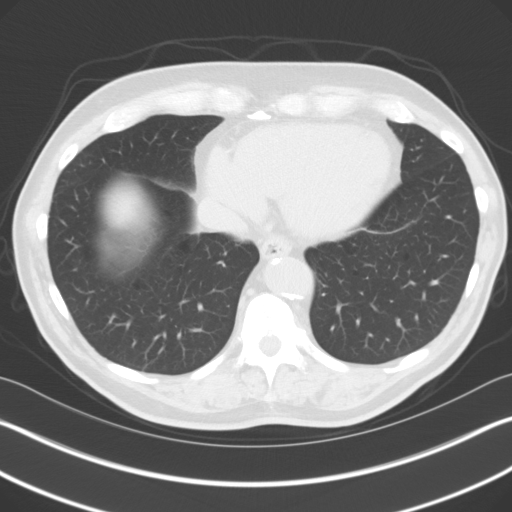

[15 of 32 positions shown; findings below may reference images not displayed]

FINDINGS: Lower chest: No acute abnormality.

Hepatobiliary: No focal liver abnormality is seen. No gallstones,
gallbladder wall thickening, or biliary dilatation.

Pancreas: Unremarkable. No pancreatic ductal dilatation or
surrounding inflammatory changes.

Spleen: Normal in size without focal abnormality.

Adrenals/Urinary Tract: Adrenal glands appear normal. Left renal
cyst is noted. Nonobstructive right nephrolithiasis is noted. No
hydronephrosis or renal obstruction is noted. No ureteral calculi
are noted. Urinary bladder is unremarkable.

Stomach/Bowel: Stomach is within normal limits. Appendix appears
normal. No evidence of bowel wall thickening, distention, or
inflammatory changes.

Vascular/Lymphatic: Aortic atherosclerosis. No enlarged abdominal or
pelvic lymph nodes.

Reproductive: Prostate is unremarkable.

Other: No abdominal wall hernia or abnormality. No abdominopelvic
ascites.

Musculoskeletal: No acute or significant osseous findings.
IMPRESSION: Nonobstructive right nephrolithiasis. No hydronephrosis or renal
obstruction is noted.

Aortic atherosclerosis.

## 2019-03-24 ENCOUNTER — Encounter: Payer: Self-pay | Admitting: General Surgery

## 2019-04-16 ENCOUNTER — Ambulatory Visit (INDEPENDENT_AMBULATORY_CARE_PROVIDER_SITE_OTHER): Payer: Medicare Other | Admitting: Internal Medicine

## 2019-04-16 ENCOUNTER — Other Ambulatory Visit: Payer: Self-pay

## 2019-04-16 ENCOUNTER — Encounter: Payer: Self-pay | Admitting: Internal Medicine

## 2019-04-16 VITALS — BP 136/80 | HR 67 | Ht 72.0 in | Wt 199.0 lb

## 2019-04-16 DIAGNOSIS — I493 Ventricular premature depolarization: Secondary | ICD-10-CM

## 2019-04-16 DIAGNOSIS — I340 Nonrheumatic mitral (valve) insufficiency: Secondary | ICD-10-CM | POA: Diagnosis not present

## 2019-04-16 DIAGNOSIS — R002 Palpitations: Secondary | ICD-10-CM | POA: Diagnosis not present

## 2019-04-16 DIAGNOSIS — I359 Nonrheumatic aortic valve disorder, unspecified: Secondary | ICD-10-CM | POA: Diagnosis not present

## 2019-04-16 MED ORDER — ROSUVASTATIN CALCIUM 20 MG PO TABS: 20.0000 mg | ORAL_TABLET | Freq: Every day | ORAL | 3 refills | Status: DC

## 2019-04-16 NOTE — Patient Instructions (Signed)
Medication Instructions:  - Your physician has recommended you make the following change in your medication:   1) STOP lipitor  2) START crestor 20 mg- take 1 tablet by mouth once daily  If you need a refill on your cardiac medications before your next appointment, please call your pharmacy.   Lab work: - none ordered  If you have labs (blood work) drawn today and your tests are completely normal, you will receive your results only by: Marland Kitchen MyChart Message (if you have MyChart) OR . A paper copy in the mail If you have any lab test that is abnormal or we need to change your treatment, we will call you to review the results.  Testing/Procedures: - Your physician has requested that you have an echocardiogram. Echocardiography is a painless test that uses sound waves to create images of your heart. It provides your doctor with information about the size and shape of your heart and how well your heart's chambers and valves are working. This procedure takes approximately one hour. There are no restrictions for this procedure.  Follow-Up: At [facility], you and your health needs are our priority.  As part of our continuing mission to provide you with exceptional heart care, we have created designated Provider Care Teams.  These Care Teams include your primary Cardiologist (physician) and Advanced Practice Providers (APPs -  Physician Assistants and Nurse Practitioners) who all work together to provide you with the care you need, when you need it.  You will need a follow up appointment in 1 year with Dr. Caryl Comes (late August/ early September 2021).   . Please call our office 2 months in advance to schedule this appointment.  (Call in early June 2021 to schedule)  Any Other Special Instructions Will Be Listed Below (If Applicable). - N/A

## 2019-04-16 NOTE — Progress Notes (Signed)
Patient Care Team: Adin Hector, MD as PCP - General (Internal Medicine) Bary Castilla Forest Gleason, MD (General Surgery)   HPI  Paul Choi is a 73 y.o. male Seen in followup for PVCs and bicuspid aortic valve; he is also status post PCI 9/17 as noted below.     Given the absence of symptoms, he was referred for preeval with Dr [facility] who after exhaustive evaluation,  LHC 9/17 >>   1. Critical stenosis of the proximal RCA treated successfully with coronary stenting using a 3.5 x 16 mm Promus drug-eluting stent 2. Chronic occlusion of the mid circumflex with the first OM branch collateralized from the LAD and right coronary artery 3. Moderate stenosis of a large ramus intermedius branch 4. Diffuse nonobstructive stenosis of the LAD 5. Normal LV function by noninvasive assessment  DATE TEST EF   5/18 Echo   45-+50 % Ao 4.0/4.1No stenosis  6/19 Echo   55-60 % Ao 41/42 No stenosis        Date Cr K Hgb LDL  9/17 1.55 4.7    3/19 1.4 4.9 14.1 77  3/20 1.4 4.3 14.6 73    The patient denies chest pain, shortness of breath, nocturnal dyspnea, orthopnea or peripheral edema.  There have been no palpitations, lightheadedness or syncope.    Struggling with the isolation of pandemic   Past Medical History:  Diagnosis Date  . Arteriosclerotic cardiovascular disease   . Arthritis 1997  . Coronary artery disease    Cardiac catheterization in August 2003 with occluded left circumflex. Echo 8/13 with mild segmental LV dysfunction, EF 40%, moderate mitral insufficiency and mild tricuspid insufficiency; aortic valve appears bicuspid  . Diabetes mellitus without complication ([place])    TYPE 2   . ED (erectile dysfunction)   . Gout   . Gout   . Heart disease   . Hyperlipidemia   . Hypertension 1982  . Insomnia   . Personal history of colonic polyps   . Renal insufficiency     Past Surgical History:  Procedure Laterality Date  . CARDIAC CATHETERIZATION  2006  . CARDIAC  CATHETERIZATION N/A 05/04/2016   Procedure: Left Heart Cath and Coronary Angiography;  Surgeon: Sherren Mocha, MD;  Location: [place] [facility];  Service: Cardiovascular;  Laterality: N/A;  . CARDIAC CATHETERIZATION N/A 05/04/2016   Procedure: Coronary Stent Intervention;  Surgeon: Sherren Mocha, MD;  Location: [place] [facility];  Service: Cardiovascular;  Laterality: N/A;  . COLONOSCOPY  2007  . COLONOSCOPY WITH PROPOFOL N/A 06/26/2017   Procedure: COLONOSCOPY WITH PROPOFOL;  Surgeon: Robert Bellow, MD;  Location: [facility] ENDOSCOPY;  Service: Endoscopy;  Laterality: N/A;  . CORONARY STENT PLACEMENT  05/04/2016   proximal RCA treated successfully with coronary stenting using a 3.5 x 16 mm Promus drug-eluting stent  . CORONARY STENT PLACEMENT  05/04/2016   Mid LAD lesion, 30 %stenosed.  Marland Kitchen HERNIA REPAIR Left 08/23/2006   Direct inguinal hernia repair with a large ultra Pro mesh.  Marland Kitchen KNEE SURGERY  1988    Current Outpatient Medications  Medication Sig Dispense Refill  . allopurinol (ZYLOPRIM) 100 MG tablet Take 200 mg by mouth every evening.     Marland Kitchen amLODipine (NORVASC) 5 MG tablet Take 5 mg by mouth every evening.     Marland Kitchen aspirin EC 81 MG tablet Take 81 mg by mouth daily.    Marland Kitchen atorvastatin (LIPITOR) 40 MG tablet TAKE 1 TABLET BY MOUTH  DAILY 90 tablet  3  . cholecalciferol (VITAMIN D) 1000 units tablet Take 1,000 Units by mouth every morning.     . colchicine 0.6 MG tablet Take 0.6 mg by mouth daily as needed (for gout flare ups.).     Marland Kitchen docusate sodium (COLACE) 100 MG capsule Take 200-300 mg by mouth daily as needed for mild constipation.    Marland Kitchen losartan (COZAAR) 100 MG tablet Take 1 tablet (100 mg total) by mouth every evening.     No current facility-administered medications for this visit.     Allergies  Allergen Reactions  . Ace Inhibitors Cough  . Contrast Media [Iodinated Diagnostic Agents] Other (See Comments)    Cough and difficulty breathing   . Lovastatin Other (See Comments)     Myalgia at 80-mg dose  . Other Nausea Only, Hives and Cough    Contrast media  Had difficulty breathing  . Shellfish Allergy Hives  . Trazodone Other (See Comments)    "felt like zombie"      Review of Systems negative except from HPI and PMH  Physical Exam BP 136/80 (BP Location: Left Arm, Patient Position: Sitting, Cuff Size: Normal)   Pulse 67   Ht 6' (1.829 m)   Wt 199 lb (90.3 kg)   SpO2 99%   BMI 26.99 kg/m  Well developed and nourished in no acute distress HENT normal Neck supple with JVP-  flat   Clear Regular rate and rhythm, no murmurs or gallops Abd-soft with active BS No Clubbing cyanosis edema Skin-warm and dry A & Oriented  Grossly normal sensory and motor function  ECG sinus at 62 Interval 16/09/41  Assessment/plan  Palpitations-PVC   Coronary artery disease with an occluded circumflex, left-right collaterals 2003  High-grade RCA stenosis status post DES   Cardiomyopathy  Resolved  >>>>>>Mitral regurgitation-moderate>>mild  Bicuspid aortic valve/mild aortic root dilitation   Hypertension  Aortic root needs annual imaging.  We will arrange  PVCs are asymptomatic.  Without symptoms of ischemia  Needs structured exercise.  Will release for the Y  Hyperlipidemia not at goal.  We will change him from atorvastatin--rosuvastatin.  He was unable to tolerate higher doses of atorvastatin.    Marland Kitchen

## 2019-05-13 ENCOUNTER — Ambulatory Visit (INDEPENDENT_AMBULATORY_CARE_PROVIDER_SITE_OTHER): Payer: Medicare Other

## 2019-05-13 ENCOUNTER — Other Ambulatory Visit: Payer: Self-pay

## 2019-05-13 DIAGNOSIS — I340 Nonrheumatic mitral (valve) insufficiency: Secondary | ICD-10-CM

## 2019-05-13 DIAGNOSIS — I359 Nonrheumatic aortic valve disorder, unspecified: Secondary | ICD-10-CM

## 2019-05-19 ENCOUNTER — Telehealth: Payer: Self-pay | Admitting: Internal Medicine

## 2019-05-19 NOTE — Telephone Encounter (Signed)
I spoke with the patient regarding his echo results.  He voices understanding.

## 2019-05-19 NOTE — Telephone Encounter (Signed)
Patient is returning your call.  

## 2019-05-19 NOTE — Telephone Encounter (Signed)
Notes recorded by Deboraha Sprang, MD on 05/17/2019 at 2:50 PM EDT  Please Inform Patient Echo showed normal heart muscle function  With stable aorta dilatation

## 2019-05-19 NOTE — Telephone Encounter (Signed)
Attempted to call the patient at his home & cell numbers.  No answer at either number- I left a message at both to please call back.

## 2019-08-27 ENCOUNTER — Other Ambulatory Visit: Payer: Self-pay | Admitting: *Deleted

## 2019-08-27 MED ORDER — ATORVASTATIN CALCIUM 40 MG PO TABS: 40.0000 mg | ORAL_TABLET | Freq: Every day | ORAL | 3 refills | Status: DC

## 2019-09-12 ENCOUNTER — Ambulatory Visit: Payer: Medicare Other | Attending: Internal Medicine

## 2019-09-12 DIAGNOSIS — Z23 Encounter for immunization: Secondary | ICD-10-CM | POA: Insufficient documentation

## 2019-09-12 NOTE — Progress Notes (Signed)
   Covid-19 Vaccination Clinic  Name:  Paul Choi    MRN: [id number] DOB: February 17, 1946  09/12/2019  [name] was observed post Covid-19 immunization for 15 minutes without incidence. He was provided with Vaccine Information Sheet and instruction to access the V-Safe system.   [name] was instructed to call 911 with any severe reactions post vaccine: Marland Kitchen Difficulty breathing  . Swelling of your face and throat  . A fast heartbeat  . A bad rash all over your body  . Dizziness and weakness    Immunizations Administered    Name Date Dose VIS Date Route   Pfizer COVID-19 Vaccine 09/12/2019 11:58 AM 0.3 mL 07/31/2019 Intramuscular   Manufacturer: [place]   Lot: [id number]   [place]: [id number]

## 2019-10-03 ENCOUNTER — Ambulatory Visit: Payer: Medicare Other

## 2019-10-03 ENCOUNTER — Ambulatory Visit: Payer: Medicare Other | Attending: Internal Medicine

## 2019-10-03 DIAGNOSIS — Z23 Encounter for immunization: Secondary | ICD-10-CM

## 2019-10-03 NOTE — Progress Notes (Signed)
   Covid-19 Vaccination Clinic  Name:  Paul Choi    MRN: [id number] DOB: [birth year]  10/03/2019  [name] was observed post Covid-19 immunization for 15 minutes without incidence. He was provided with Vaccine Information Sheet and instruction to access the V-Safe system.   [name] was instructed to call 911 with any severe reactions post vaccine: Marland Kitchen Difficulty breathing  . Swelling of your face and throat  . A fast heartbeat  . A bad rash all over your body  . Dizziness and weakness    Immunizations Administered    Name Date Dose VIS Date Route   Pfizer COVID-19 Vaccine 10/03/2019  9:51 AM 0.3 mL 07/31/2019 Intramuscular   Manufacturer: [place]   Lot: [id number]   [place]: [id number]

## 2020-05-16 ENCOUNTER — Telehealth: Payer: Self-pay

## 2020-05-16 NOTE — Telephone Encounter (Signed)
Left message for patient to return call to office regarding scheduling colonoscopy- needs to let us know if he wants Dr.Byrnett to continue care of if we need to place referral in epic for him to see [facility].

## 2020-05-16 NOTE — Telephone Encounter (Signed)
Received call back from the patient.  He will call Dr. Festus Aloe office at [place] and handle his care from there.

## 2020-05-18 ENCOUNTER — Encounter: Payer: Self-pay | Admitting: Dermatology

## 2020-05-18 ENCOUNTER — Other Ambulatory Visit: Payer: Self-pay

## 2020-05-18 ENCOUNTER — Ambulatory Visit (INDEPENDENT_AMBULATORY_CARE_PROVIDER_SITE_OTHER): Payer: Medicare Other | Admitting: Dermatology

## 2020-05-18 DIAGNOSIS — L821 Other seborrheic keratosis: Secondary | ICD-10-CM

## 2020-05-18 DIAGNOSIS — Z1283 Encounter for screening for malignant neoplasm of skin: Secondary | ICD-10-CM

## 2020-05-18 DIAGNOSIS — L720 Epidermal cyst: Secondary | ICD-10-CM

## 2020-05-18 DIAGNOSIS — Z86018 Personal history of other benign neoplasm: Secondary | ICD-10-CM

## 2020-05-18 DIAGNOSIS — D229 Melanocytic nevi, unspecified: Secondary | ICD-10-CM

## 2020-05-18 DIAGNOSIS — D485 Neoplasm of uncertain behavior of skin: Secondary | ICD-10-CM

## 2020-05-18 DIAGNOSIS — L814 Other melanin hyperpigmentation: Secondary | ICD-10-CM | POA: Diagnosis not present

## 2020-05-18 DIAGNOSIS — D1801 Hemangioma of skin and subcutaneous tissue: Secondary | ICD-10-CM

## 2020-05-18 DIAGNOSIS — L578 Other skin changes due to chronic exposure to nonionizing radiation: Secondary | ICD-10-CM

## 2020-05-18 HISTORY — DX: Personal history of other benign neoplasm: Z86.018

## 2020-05-18 NOTE — Patient Instructions (Addendum)
Melanoma ABCDEs  Melanoma is the most dangerous type of skin cancer, and is the leading cause of death from skin disease.  You are more likely to develop melanoma if you:  Have light-colored skin, light-colored eyes, or red or blond hair  Spend a lot of time in the sun  Tan regularly, either outdoors or in a tanning bed  Have had blistering sunburns, especially during childhood  Have a close family member who has had a melanoma  Have atypical moles or large birthmarks  Early detection of melanoma is key since treatment is typically straightforward and cure rates are extremely high if we catch it early.   The first sign of melanoma is often a change in a mole or a new dark spot.  The ABCDE system is a way of remembering the signs of melanoma.  A for asymmetry:  The two halves do not match. B for border:  The edges of the growth are irregular. C for color:  A mixture of colors are present instead of an even brown color. D for diameter:  Melanomas are usually (but not always) greater than 6mm - the size of a pencil eraser. E for evolution:  The spot keeps changing in size, shape, and color.  Please check your skin once per month between visits. You can use a small mirror in front and a large mirror behind you to keep an eye on the back side or your body.   If you see any new or changing lesions before your next follow-up, please call to schedule a visit.  Please continue daily skin protection including broad spectrum sunscreen SPF 30+ to sun-exposed areas, reapplying every 2 hours as needed when you're outdoors.   Wound Care Instructions  1. Cleanse wound gently with soap and water once a day then pat dry with clean gauze. Apply a thing coat of Petrolatum (petroleum jelly, "Vaseline") over the wound (unless you have an allergy to this). We recommend that you use a new, sterile tube of Vaseline. Do not pick or remove scabs. Do not remove the yellow or white "healing tissue" from the  base of the wound.  2. Cover the wound with fresh, clean, nonstick gauze and secure with paper tape. You may use Band-Aids in place of gauze and tape if the would is small enough, but would recommend trimming much of the tape off as there is often too much. Sometimes Band-Aids can irritate the skin.  3. You should call the office for your biopsy report after 1 week if you have not already been contacted.  4. If you experience any problems, such as abnormal amounts of bleeding, swelling, significant bruising, significant pain, or evidence of infection, please call the office immediately.  5. FOR ADULT SURGERY PATIENTS: If you need something for pain relief you may take 1 extra strength Tylenol (acetaminophen) AND 2 Ibuprofen (200mg each) together every 4 hours as needed for pain. (do not take these if you are allergic to them or if you have a reason you should not take them.) Typically, you may only need pain medication for 1 to 3 days.      

## 2020-05-18 NOTE — Progress Notes (Signed)
   Follow-Up Visit   Subjective  Paul Choi is a 74 y.o. male who presents for the following: FBSE.  Patient here for full body skin exam and skin cancer screening. Patient with history of severely dysplastic nevus of abdomen. Not aware of anything new or changing.   The following portions of the chart were reviewed this encounter and updated as appropriate:  Tobacco  Allergies  Meds  Problems  Med Hx  Surg Hx  Fam Hx      Review of Systems:  No other skin or systemic complaints except as noted in HPI or Assessment and Plan.  Objective  Well appearing patient in no apparent distress; mood and affect are within normal limits.  A full examination was performed including scalp, head, eyes, ears, nose, lips, neck, chest, axillae, abdomen, back, buttocks, bilateral upper extremities, bilateral lower extremities, hands, feet, fingers, toes, fingernails, and toenails. All findings within normal limits unless otherwise noted below.  Objective  Left lateral inferior chest: 0.2cm irregular dark brown papule       Objective  Left Upper Back: Subcutaneous nodule.    Assessment & Plan  Neoplasm of uncertain behavior of skin Left lateral inferior chest  Epidermal / dermal shaving  Lesion diameter (cm):  0.2 Informed consent: discussed and consent obtained   Timeout: patient name, date of birth, surgical site, and procedure verified   Patient was prepped and draped in usual sterile fashion: area prepped with isopropyl alcohol. Anesthesia: the lesion was anesthetized in a standard fashion   Anesthetic:  1% lidocaine w/ epinephrine 1-100,000 buffered w/ 8.4% NaHCO3 Instrument used: flexible razor blade   Hemostasis achieved with: aluminum chloride   Outcome: patient tolerated procedure well   Post-procedure details: wound care instructions given   Additional details:  Mupirocin and a bandage applied  Epidermal inclusion cyst Left Upper Back  Benign-appearing.   Observation.  Call clinic for new or changing lesions.  Recommend daily use of broad spectrum spf 30+ sunscreen to sun-exposed areas.     Lentigines - Scattered tan macules - Discussed due to sun exposure - Benign, observe - Call for any changes  Seborrheic Keratoses - Stuck-on, waxy, tan-brown papules and plaques  - Discussed benign etiology and prognosis. - Observe - Call for any changes  Melanocytic Nevi - Tan-brown and/or pink-flesh-colored symmetric macules and papules - Benign appearing on exam today - Observation - Call clinic for new or changing moles - Recommend daily use of broad spectrum spf 30+ sunscreen to sun-exposed areas.   Hemangiomas - Red papules - Discussed benign nature - Observe - Call for any changes  Actinic Damage - diffuse scaly erythematous macules with underlying dyspigmentation - Recommend daily broad spectrum sunscreen SPF 30+ to sun-exposed areas, reapply every 2 hours as needed.  - Call for new or changing lesions.  Skin cancer screening performed today.  History of Dysplastic Nevi - No evidence of recurrence today - severe at right lower chest excised 09/2017, multiple others - Recommend regular full body skin exams - Recommend daily broad spectrum sunscreen SPF 30+ to sun-exposed areas, reapply every 2 hours as needed.  - Call if any new or changing lesions are noted between office visits  Return in about 1 year (around 05/18/2021) for TBSE.  Graciella Belton, RMA, am acting as scribe for Forest Gleason, MD .  Documentation: I have reviewed the above documentation for accuracy and completeness, and I agree with the above.  Forest Gleason, MD

## 2020-05-19 ENCOUNTER — Ambulatory Visit (INDEPENDENT_AMBULATORY_CARE_PROVIDER_SITE_OTHER): Payer: Medicare Other | Admitting: Internal Medicine

## 2020-05-19 ENCOUNTER — Encounter: Payer: Self-pay | Admitting: Internal Medicine

## 2020-05-19 VITALS — BP 168/80 | HR 72 | Ht 72.0 in | Wt 194.4 lb

## 2020-05-19 DIAGNOSIS — R002 Palpitations: Secondary | ICD-10-CM | POA: Diagnosis not present

## 2020-05-19 DIAGNOSIS — I2510 Atherosclerotic heart disease of native coronary artery without angina pectoris: Secondary | ICD-10-CM | POA: Diagnosis not present

## 2020-05-19 DIAGNOSIS — I359 Nonrheumatic aortic valve disorder, unspecified: Secondary | ICD-10-CM

## 2020-05-19 DIAGNOSIS — I493 Ventricular premature depolarization: Secondary | ICD-10-CM

## 2020-05-19 DIAGNOSIS — I340 Nonrheumatic mitral (valve) insufficiency: Secondary | ICD-10-CM

## 2020-05-19 DIAGNOSIS — I10 Essential (primary) hypertension: Secondary | ICD-10-CM

## 2020-05-19 NOTE — Progress Notes (Signed)
Patient Care Team: Adin Hector, MD as PCP - General (Internal Medicine) Bary Castilla Forest Gleason, MD (General Surgery)   HPI  Paul Choi is a 74 y.o. male Seen in followup for PVCs and bicuspid aortic valve and asymptomatic prox RCA >> stented 9/17  The patient denies chest pain, shortness of breath, nocturnal dyspnea, orthopnea or peripheral edema.  There have been no palpitations, lightheadedness or syncope.    Getting out some   BP often high  Does not check at home    Given the absence of symptoms, he was referred for preeval with Dr [facility] who after exhaustive evaluation,  LHC 9/17 >>   1. Critical stenosis of the proximal RCA treated successfully with coronary stenting using a 3.5 x 16 mm Promus drug-eluting stent 2. Chronic occlusion of the mid circumflex with the first OM branch collateralized from the LAD and right coronary artery 3. Moderate stenosis of a large ramus intermedius branch 4. Diffuse nonobstructive stenosis of the LAD     DATE TEST EF   5/18 Echo   45-+50 % Ao 4.0/4.1No stenosis  6/19 Echo   55-60 % Ao 41/42 No stenosis  9/20 Echo  50-55% Ao 49mm    Date Cr K Hgb LDL  9/17 1.55 4.7    3/19 1.4 4.9 14.1 77  3/20 1.4 4.3 14.6 73  9/21 1.6 4.7 13.4 76 (4/21)    Failed to tolerate rosuvastatin  Back on atorvastatin       Past Medical History:  Diagnosis Date   Arteriosclerotic cardiovascular disease    Arthritis 1997   Coronary artery disease    Cardiac catheterization in August 2003 with occluded left circumflex. Echo 8/13 with mild segmental LV dysfunction, EF 40%, moderate mitral insufficiency and mild tricuspid insufficiency; aortic valve appears bicuspid   Diabetes mellitus without complication ([facility])    TYPE 2    Dysplastic nevus 09/27/2017   Left flank. Mild atypia, irritated, limited margins free.   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 Severe atypia, close to lateral margin. Excised 10/23/2017, margins free.     Dysplastic nevus 10/09/2018   Right lower abdomen. Moderate atypia, limited margins free.    Dysplastic nevus 04/02/2019   Low back mid line. Moderate atypia, irritated, limited magins free.   ED (erectile dysfunction)    Gout    Gout    Heart disease    Hyperlipidemia    Hypertension 1982   Insomnia    Personal history of colonic polyps    Renal insufficiency     Past Surgical History:  Procedure Laterality Date   CARDIAC CATHETERIZATION  2006   CARDIAC CATHETERIZATION N/A 05/04/2016   Procedure: Left Heart Cath and Coronary Angiography;  Surgeon: Sherren Mocha, MD;  Location: [place] [facility];  Service: Cardiovascular;  Laterality: N/A;   CARDIAC CATHETERIZATION N/A 05/04/2016   Procedure: Coronary Stent Intervention;  Surgeon: Sherren Mocha, MD;  Location: [place] [facility];  Service: Cardiovascular;  Laterality: N/A;   COLONOSCOPY  2007   COLONOSCOPY WITH PROPOFOL N/A 06/26/2017   Procedure: COLONOSCOPY WITH PROPOFOL;  Surgeon: Robert Bellow, MD;  Location: [facility] ENDOSCOPY;  Service: Endoscopy;  Laterality: N/A;   CORONARY STENT PLACEMENT  05/04/2016   proximal RCA treated successfully with coronary stenting using a 3.5 x 16 mm Promus drug-eluting stent   CORONARY STENT PLACEMENT  05/04/2016   Mid LAD lesion, 30 %stenosed.   HERNIA REPAIR Left 08/23/2006  Direct inguinal hernia repair with a large ultra Pro mesh.   KNEE SURGERY  1988    Current Outpatient Medications  Medication Sig Dispense Refill   allopurinol (ZYLOPRIM) 100 MG tablet Take 200 mg by mouth every evening.      amLODipine (NORVASC) 5 MG tablet Take 5 mg by mouth every evening.      aspirin EC 81 MG tablet Take 81 mg by mouth daily.     atorvastatin (LIPITOR) 40 MG tablet Take 1 tablet (40 mg total) by mouth daily. 90 tablet 3   cholecalciferol (VITAMIN D) 1000 units tablet Take 1,000 Units by mouth every morning.      colchicine 0.6 MG tablet Take 0.6 mg by mouth daily  as needed (for gout flare ups.).      docusate sodium (COLACE) 100 MG capsule Take 200-300 mg by mouth daily as needed for mild constipation.     losartan (COZAAR) 100 MG tablet Take 1 tablet (100 mg total) by mouth every evening.     No current facility-administered medications for this visit.    Allergies  Allergen Reactions   Ace Inhibitors Cough   Contrast Media [Iodinated Diagnostic Agents] Other (See Comments)    Cough and difficulty breathing    Lovastatin Other (See Comments)    Myalgia at 80-mg dose   Other Nausea Only, Hives and Cough    Contrast media  Had difficulty breathing   Shellfish Allergy Hives   Trazodone Other (See Comments)    "felt like zombie"      Review of Systems negative except from HPI and PMH  Physical Exam BP (!) 168/80    Pulse 72    Ht 6' (1.829 m)    Wt 194 lb 6 oz (88.2 kg)    BMI 26.36 kg/m  Well developed and nourished in no acute distress HENT normal Neck supple with JVP-  flat   Carotids brisk w.o bruit Clear Regular rate and rhythm, no murmurs or gallops Abd-soft with active BS No Clubbing cyanosis edema Skin-warm and dry A & Oriented  Grossly normal sensory and motor function  ECG sinus @ 620/09/41   Assessment/plan  Palpitations-PVC   Coronary artery disease with an occluded circumflex, left-right collaterals 2003  High-grade RCA stenosis status post DES   Cardiomyopathy  Resolved  >>>>>>Mitral regurgitation-moderate>>mild  Bicuspid aortic valve/mild aortic root dilitation   Hypertension  PVC quiescent  BP significantly elevated  Reviewed endpoint data and the importance of target of at least 130, noting also the recent [place] study showing incremental benefit from 115 mm  Without symptoms of ischemia  LDL as good as we are going to do , and is close enuf to target that I dont think PCSK9 Rx is indicated  Aortic root without change over years, will repeat next year    .

## 2020-05-19 NOTE — Patient Instructions (Signed)
Medication Instructions:  - Your physician recommends that you continue on your current medications as directed. Please refer to the Current Medication list given to you today.  *If you need a refill on your cardiac medications before your next appointment, please call your pharmacy*   Lab Work: - none ordered  If you have labs (blood work) drawn today and your tests are completely normal, you will receive your results only by: Marland Kitchen MyChart Message (if you have MyChart) OR . A paper copy in the mail If you have any lab test that is abnormal or we need to change your treatment, we will call you to review the results.   Testing/Procedures: - none ordered   Follow-Up: At [facility], you and your health needs are our priority.  As part of our continuing mission to provide you with exceptional heart care, we have created designated Provider Care Teams.  These Care Teams include your primary Cardiologist (physician) and Advanced Practice Providers (APPs -  Physician Assistants and Nurse Practitioners) who all work together to provide you with the care you need, when you need it.  We recommend signing up for the patient portal called "MyChart".  Sign up information is provided on this After Visit Summary.  MyChart is used to connect with patients for Virtual Visits (Telemedicine).  Patients are able to view lab/test results, encounter notes, upcoming appointments, etc.  Non-urgent messages can be sent to your provider as well.   To learn more about what you can do with MyChart, go to [email or web address].    Your next appointment:   15 month(s)  The format for your next appointment:   In Person  Provider:   Virl Axe, MD   Other Instructions

## 2020-05-24 ENCOUNTER — Telehealth: Payer: Self-pay

## 2020-05-24 NOTE — Telephone Encounter (Signed)
Patient called and informed of biopsy results, patient verbalized understanding. Scheduled for 08/25/2020 @ 8:45

## 2020-05-24 NOTE — Telephone Encounter (Signed)
-----   Message from Alfonso Patten, MD sent at 05/24/2020 12:52 PM EDT ----- Skin , left lateral inferior chest DYSPLASTIC JUNCTIONAL NEVUS WITH MODERATE ATYPIA, LIMITED MARGINS FREE  This is a MODERATELY ATYPICAL MOLE. On the spectrum from normal mole to melanoma skin cancer, this is in between the two. - We need to recheck this area sometime in the next 6 months to be sure there is no evidence of the atypical mole coming back. If there is any color coming back, we would recommend repeating the biopsy to be sure the cells look normal.  - People who have a history of atypical moles do have a slightly increased risk of developing melanoma somewhere on the body, so a yearly full body skin exam by a dermatologist is recommended.  - Monthly self skin checks and daily sun protection are also recommended.  - Please call if you notice a dark spot coming back where this biopsy was taken.  - Please also call if you notice any new or changing spots anywhere else on the body before your follow-up visit.   MAs please call and schedule f/u in the next 3-6 months to recheck the site

## 2020-05-24 NOTE — Progress Notes (Signed)
Skin , left lateral inferior chest DYSPLASTIC JUNCTIONAL NEVUS WITH MODERATE ATYPIA, LIMITED MARGINS FREE  This is a MODERATELY ATYPICAL MOLE. On the spectrum from normal mole to melanoma skin cancer, this is in between the two. - We need to recheck this area sometime in the next 6 months to be sure there is no evidence of the atypical mole coming back. If there is any color coming back, we would recommend repeating the biopsy to be sure the cells look normal.  - People who have a history of atypical moles do have a slightly increased risk of developing melanoma somewhere on the body, so a yearly full body skin exam by a dermatologist is recommended.  - Monthly self skin checks and daily sun protection are also recommended.  - Please call if you notice a dark spot coming back where this biopsy was taken.  - Please also call if you notice any new or changing spots anywhere else on the body before your follow-up visit.   MAs please call and schedule f/u in the next 3-6 months to recheck the site

## 2020-06-03 ENCOUNTER — Other Ambulatory Visit: Payer: Self-pay | Admitting: Internal Medicine

## 2020-06-03 ENCOUNTER — Ambulatory Visit: Payer: Medicare Other | Attending: Internal Medicine

## 2020-06-03 DIAGNOSIS — Z23 Encounter for immunization: Secondary | ICD-10-CM

## 2020-06-03 NOTE — Progress Notes (Signed)
   Covid-19 Vaccination Clinic  Name:  TELLIS SPIVAK    MRN: [id number] DOB: [birth year]  06/03/2020  Mr. Vullo was observed post Covid-19 immunization for 15 minutes without incident. He was provided with Vaccine Information Sheet and instruction to access the V-Safe system.   Mr. Burnsworth was instructed to call 911 with any severe reactions post vaccine: Marland Kitchen Difficulty breathing  . Swelling of face and throat  . A fast heartbeat  . A bad rash all over body  . Dizziness and weakness

## 2020-07-05 ENCOUNTER — Other Ambulatory Visit: Payer: Self-pay | Admitting: Internal Medicine

## 2020-07-05 ENCOUNTER — Other Ambulatory Visit: Payer: Self-pay | Admitting: General Surgery

## 2020-07-05 NOTE — Progress Notes (Signed)
Subjective:     Patient ID: Paul Choi is a 74 y.o. male.  HPI  The following portions of the patient's history were reviewed and updated as appropriate.  This an established patient is here today for: office visit. The patient is here today to discuss a colonoscopy. The patient's last colonoscopy was completed on 06-26-17. The patient reports his bowels move about every other day. He does use Miralax as needed. The patient denies any rectal bleeding or mucus.        Chief Complaint  Patient presents with  . Colonoscopy    discussion     BP 142/80   Pulse 73   Temp 36.6 C (97.9 F)   Ht 182.9 cm (6')   Wt 88.9 kg (196 lb)   SpO2 98%   BMI 26.58 kg/m       Past Medical History:  Diagnosis Date  . Anemia   . Angina pectoris (CMS-HCC)   . CAD (coronary artery disease)    Cardiac catheterization in August 2003 with occluded left circumflex, followed by Dr. Serafina Royals.  Echo 8/13 with mild segmental LV dysfunction, EF 40%, moderate mitral insufficiency and mild tricuspid insufficiency; aortic valve appears bicuspid.   . CKD (chronic kidney disease), stage III (CMS-HCC)   . Diabetes mellitus type 2, uncomplicated (CMS-HCC)    adult onset  . Gout   . Heart disease   . Hyperlipidemia   . Hypertension   . Inflammatory arthritis    left knee, chondrocalcinosis.   . Plantar fascial fibromatosis   . Pseudogout   . Radiculopathy    Left lower extremity. a.EMGs negative. b.Abnormal MRI. c.Weakness left leg.  . Right knee injury    History of lawn mower injury, s/p surgical repair of tendons by Dr. Earnestine Leys  . Sleep apnea   . Spinal stenosis, lumbar region, without neurogenic claudication   . Vitamin D deficiency           Past Surgical History:  Procedure Laterality Date  . COLONOSCOPY  2007   colon polyps, by Dr. Bary Castilla  . HERNIA REPAIR  2008   right inguinal  . Injury to right knee with lawn mower  1988   status  post surgical repair of tendons        Social History          Socioeconomic History  . Marital status: Married    Spouse name: Not on file  . Number of children: Not on file  . Years of education: Not on file  . Highest education level: Not on file  Occupational History  . Not on file  Tobacco Use  . Smoking status: Former Research scientist (life sciences)  . Smokeless tobacco: Never Used  . Tobacco comment: very remote  Vaping Use  . Vaping Use: Never used  Substance and Sexual Activity  . Alcohol use: Yes    Alcohol/week: 0.0 standard drinks    Comment: occasional  . Drug use: Not on file  . Sexual activity: Not on file  Other Topics Concern  . Not on file  Social History Narrative  . Not on file   Social Determinants of Health   Financial Resource Strain: Not on file  Food Insecurity: Not on file  Transportation Needs: Not on file            Allergies  Allergen Reactions  . Ace Inhibitors Cough  . Darvocet-N 100 [Propoxyphene N-Acetaminophen] Nausea  . Lovastatin Other (See Comments)    Myalgia at  80-mg dose  . Other Cough, Hives and Nausea    Other reaction(s): Cough Contrast media  Had difficulty breathing Contrast media  Had difficulty breathing  . Shellfish Containing Products Hives  . Trazodone Other (See Comments)    "felt like zombie"    Current Medications        Current Outpatient Medications  Medication Sig Dispense Refill  . allopurinoL (ZYLOPRIM) 100 MG tablet TAKE 2 TABLETS BY MOUTH  ONCE DAILY 180 tablet 3  . amLODIPine (NORVASC) 5 MG tablet TAKE 1 TABLET BY MOUTH ONCE DAILY 90 tablet 3  . aspirin 81 MG EC tablet Take 81 mg by mouth once daily.    Marland Kitchen atorvastatin calcium (ATORVASTATIN ORAL) Take 1 tablet by mouth once daily    . cholecalciferol (VITAMIN D3) 1,000 unit capsule Take 1,000 Units by mouth once daily.    . colchicine (COLCRYS) 0.6 mg tablet Take 2 tablets (1.2mg ) by mouth at first sign of gout flare followed by 1 tablet  (0.6mg ) after 1 hour. (Max 1.8mg  within 1 hour) 30 tablet 3  . losartan (COZAAR) 100 MG tablet TAKE 1 TABLET BY MOUTH ONCE DAILY 90 tablet 3  . triamcinolone 0.1 % cream Apply topically 2 (two) times daily 30 g 0   No current facility-administered medications for this visit.           Family History  Problem Relation Age of Onset  . Osteoarthritis Mother   . Hyperlipidemia (Elevated cholesterol) Mother   . High blood pressure (Hypertension) Mother   . Gout Other         Review of Systems  Constitutional: Negative for chills and fever.  Respiratory: Negative for cough.        Objective:   Physical Exam Constitutional:      Appearance: Normal appearance.  Cardiovascular:     Rate and Rhythm: Normal rate and regular rhythm.     Pulses: Normal pulses.     Heart sounds: Normal heart sounds.  Pulmonary:     Effort: Pulmonary effort is normal.     Breath sounds: Normal breath sounds.  Neurological:     Mental Status: He is alert and oriented to person, place, and time.  Psychiatric:        Mood and Affect: Mood normal.        Behavior: Behavior normal.    Labs and Radiology:   June 26, 2017 colonoscopy:  DIAGNOSIS:  A. COLON POLYP, ASCENDING; COLD BIOPSY:  - FEATURES OF AN INFLAMMATORY POLYP.   B. COLON POLYP 3, HEPATIC FLEXURE; C BX COLD BIOPSY:  - TUBULAR ADENOMAS (3).  - NEGATIVE FOR HIGH-GRADE DYSPLASIA AND MALIGNANCY.   Based on the 2020 [place] multisociety task for recommendation for follow-up 3-5 years.      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  No ongoing platelet blockade since stent placement in 2017.    Plan:       The patient is to be scheduled for a colonoscopy at his convenience after the [place]. It is okay for patient to continue an 81 mg aspirin once daily. He has been asked to use Miralax and Dulcolax for his prep.  Patient to follow up as scheduled and is aware to call  for any new issues or concerns.     Entered by Ledell Noss, CMA, acting as a scribe for Dr. Hervey Ard, MD.   The documentation recorded by the scribe accurately reflects the service I  personally performed and the decisions made by me.   Robert Bellow, MD FACS

## 2020-08-25 ENCOUNTER — Ambulatory Visit: Payer: Medicare Other | Admitting: Dermatology

## 2020-08-31 ENCOUNTER — Other Ambulatory Visit: Payer: Medicare Other | Attending: General Surgery

## 2020-09-02 ENCOUNTER — Ambulatory Visit: Payer: Medicare Other | Admitting: Anesthesiology

## 2020-09-02 ENCOUNTER — Other Ambulatory Visit: Payer: Self-pay

## 2020-09-02 ENCOUNTER — Ambulatory Visit
Admission: RE | Admit: 2020-09-02 | Discharge: 2020-09-02 | Disposition: A | Discharge status: Home / Self Care | Payer: Medicare Other | Attending: General Surgery | Admitting: General Surgery

## 2020-09-02 ENCOUNTER — Encounter
Admission: RE | Disposition: A | Discharge status: Home / Self Care | Payer: Self-pay | Source: Home / Self Care | Attending: General Surgery

## 2020-09-02 DIAGNOSIS — Z888 Allergy status to other drugs, medicaments and biological substances status: Secondary | ICD-10-CM | POA: Diagnosis not present

## 2020-09-02 DIAGNOSIS — Z91013 Allergy to seafood: Secondary | ICD-10-CM | POA: Diagnosis not present

## 2020-09-02 DIAGNOSIS — Z91041 Radiographic dye allergy status: Secondary | ICD-10-CM | POA: Insufficient documentation

## 2020-09-02 DIAGNOSIS — Z1211 Encounter for screening for malignant neoplasm of colon: Secondary | ICD-10-CM | POA: Diagnosis not present

## 2020-09-02 DIAGNOSIS — Z86010 Personal history of colonic polyps: Secondary | ICD-10-CM | POA: Insufficient documentation

## 2020-09-02 DIAGNOSIS — Z79899 Other long term (current) drug therapy: Secondary | ICD-10-CM | POA: Diagnosis not present

## 2020-09-02 DIAGNOSIS — D124 Benign neoplasm of descending colon: Secondary | ICD-10-CM | POA: Diagnosis not present

## 2020-09-02 DIAGNOSIS — Z7982 Long term (current) use of aspirin: Secondary | ICD-10-CM | POA: Insufficient documentation

## 2020-09-02 DIAGNOSIS — Z87891 Personal history of nicotine dependence: Secondary | ICD-10-CM | POA: Diagnosis not present

## 2020-09-02 DIAGNOSIS — Z885 Allergy status to narcotic agent status: Secondary | ICD-10-CM | POA: Insufficient documentation

## 2020-09-02 DIAGNOSIS — D123 Benign neoplasm of transverse colon: Secondary | ICD-10-CM | POA: Diagnosis not present

## 2020-09-02 HISTORY — PX: COLONOSCOPY WITH PROPOFOL: SHX5780

## 2020-09-02 LAB — GLUCOSE, CAPILLARY: Glucose-Capillary: 136 mg/dL — ABNORMAL HIGH (ref 70–99)

## 2020-09-02 SURGERY — COLONOSCOPY WITH PROPOFOL
Anesthesia: General

## 2020-09-02 MED ORDER — LIDOCAINE HCL (PF) 2 % IJ SOLN
INTRAMUSCULAR | Status: DC | PRN
  Administered 2020-09-02: 60 mg

## 2020-09-02 MED ORDER — PROPOFOL 10 MG/ML IV BOLUS
INTRAVENOUS | Status: DC | PRN
  Administered 2020-09-02 (×2): 20 mg via INTRAVENOUS

## 2020-09-02 MED ORDER — PROPOFOL 500 MG/50ML IV EMUL
INTRAVENOUS | Status: DC | PRN
  Administered 2020-09-02: 50 ug/kg/min via INTRAVENOUS

## 2020-09-02 MED ORDER — FENTANYL CITRATE (PF) 100 MCG/2ML IJ SOLN
INTRAMUSCULAR | Status: DC | PRN
  Administered 2020-09-02 (×4): 25 ug via INTRAVENOUS

## 2020-09-02 MED ORDER — MIDAZOLAM HCL 2 MG/2ML IJ SOLN
INTRAMUSCULAR | Status: AC
  Filled 2020-09-02: qty 2

## 2020-09-02 MED ORDER — LIDOCAINE HCL (PF) 2 % IJ SOLN
INTRAMUSCULAR | Status: AC
  Filled 2020-09-02: qty 5

## 2020-09-02 MED ORDER — MIDAZOLAM HCL 5 MG/5ML IJ SOLN
INTRAMUSCULAR | Status: DC | PRN
  Administered 2020-09-02 (×2): 1 mg via INTRAVENOUS

## 2020-09-02 MED ORDER — FENTANYL CITRATE (PF) 100 MCG/2ML IJ SOLN
INTRAMUSCULAR | Status: AC
  Filled 2020-09-02: qty 2

## 2020-09-02 MED ORDER — PHENYLEPHRINE HCL (PRESSORS) 10 MG/ML IV SOLN
INTRAVENOUS | Status: AC
  Filled 2020-09-02: qty 1

## 2020-09-02 MED ORDER — SODIUM CHLORIDE 0.9 % IV SOLN
INTRAVENOUS | Status: DC
  Administered 2020-09-02: 20 mL/h via INTRAVENOUS

## 2020-09-02 MED ORDER — PROPOFOL 500 MG/50ML IV EMUL
INTRAVENOUS | Status: AC
  Filled 2020-09-02: qty 50

## 2020-09-02 NOTE — H&P (Signed)
Paul Choi [id number] [birth year]     HPI:  Patient with past history of colon polyps.  For f/u exam.   Medications Prior to Admission  Medication Sig Dispense Refill Last Dose  . allopurinol (ZYLOPRIM) 100 MG tablet Take 200 mg by mouth every evening.    09/01/2020 at Unknown time  . amLODipine (NORVASC) 5 MG tablet Take 5 mg by mouth every evening.    09/01/2020 at Unknown time  . aspirin EC 81 MG tablet Take 81 mg by mouth daily.   Past Week at Unknown time  . atorvastatin (LIPITOR) 40 MG tablet TAKE 1 TABLET BY MOUTH  DAILY 90 tablet 3 09/01/2020 at Unknown time  . cholecalciferol (VITAMIN D) 1000 units tablet Take 1,000 Units by mouth every morning.    Past Week at Unknown time  . colchicine 0.6 MG tablet Take 0.6 mg by mouth daily as needed (for gout flare ups.).    Past Week at Unknown time  . docusate sodium (COLACE) 100 MG capsule Take 200-300 mg by mouth daily as needed for mild constipation.   Past Week at Unknown time  . losartan (COZAAR) 100 MG tablet Take 1 tablet (100 mg total) by mouth every evening.   09/01/2020 at Unknown time   Allergies  Allergen Reactions  . Ace Inhibitors Cough  . Contrast Media [Iodinated Diagnostic Agents] Other (See Comments)    Cough and difficulty breathing   . Lovastatin Other (See Comments)    Myalgia at 80-mg dose  . Other Nausea Only, Hives and Cough    Contrast media  Had difficulty breathing  . Shellfish Allergy Hives  . Trazodone Other (See Comments)    "felt like zombie"   Past Medical History:  Diagnosis Date  . Arteriosclerotic cardiovascular disease   . Arthritis 1997  . Coronary artery disease    Cardiac catheterization in August 2003 with occluded left circumflex. Echo 8/13 with mild segmental LV dysfunction, EF 40%, moderate mitral insufficiency and mild tricuspid insufficiency; aortic valve appears bicuspid  . Diabetes mellitus without complication ([place])    TYPE 2   . Dysplastic nevus 09/27/2017   Left flank. Mild  atypia, irritated, limited margins free.  Marland Kitchen Dysplastic nevus 09/27/2017   Right lower chest. Severe atypia, close to lateral margin. Excised 10/23/2017, margins free.  Marland Kitchen Dysplastic nevus 10/09/2018   Right lower abdomen. Moderate atypia, limited margins free.   Marland Kitchen Dysplastic nevus 04/02/2019   Low back mid line. Moderate atypia, irritated, limited magins free.  . ED (erectile dysfunction)   . Gout   . Gout   . Heart disease   . History of dysplastic nevus 05/18/2020   left lateral inferior chest  . Hyperlipidemia   . Hypertension 1982  . Insomnia   . Personal history of colonic polyps   . Renal insufficiency    Past Surgical History:  Procedure Laterality Date  . CARDIAC CATHETERIZATION  2006  . CARDIAC CATHETERIZATION N/A 05/04/2016   Procedure: Left Heart Cath and Coronary Angiography;  Surgeon: Sherren Mocha, MD;  Location: [facility];  Service: Cardiovascular;  Laterality: N/A;  . CARDIAC CATHETERIZATION N/A 05/04/2016   Procedure: Coronary Stent Intervention;  Surgeon: Sherren Mocha, MD;  Location: [facility];  Service: Cardiovascular;  Laterality: N/A;  . COLONOSCOPY  2007  . COLONOSCOPY WITH PROPOFOL N/A 06/26/2017   Procedure: COLONOSCOPY WITH PROPOFOL;  Surgeon: Robert Bellow, MD;  Location: [facility];  Service: Endoscopy;  Laterality: N/A;  . CORONARY STENT  PLACEMENT  05/04/2016   proximal RCA treated successfully with coronary stenting using a 3.5 x 16 mm Promus drug-eluting stent  . CORONARY STENT PLACEMENT  05/04/2016   Mid LAD lesion, 30 %stenosed.  Marland Kitchen HERNIA REPAIR Left 08/23/2006   Direct inguinal hernia repair with a large ultra Pro mesh.  Marland Kitchen KNEE SURGERY  1988   Social History   Socioeconomic History  . Marital status: Married    Spouse name: Not on file  . Number of children: Not on file  . Years of education: Not on file  . Highest education level: Not on file  Occupational History  . Not on file  Tobacco Use  . Smoking status:  Former Smoker    Packs/day: 1.00    Years: 25.00    Pack years: 25.00    Quit date: 09/22/1995    Years since quitting: 24.9  . Smokeless tobacco: Never Used  Substance and Sexual Activity  . Alcohol use: Yes    Comment: OCCASIONAL  . Drug use: No  . Sexual activity: Not on file  Other Topics Concern  . Not on file  Social History Narrative  . Not on file   Social Determinants of Health   Financial Resource Strain: Not on file  Food Insecurity: Not on file  Transportation Needs: Not on file  Physical Activity: Not on file  Stress: Not on file  Social Connections: Not on file  Intimate Partner Violence: Not on file   Social History   Social History Narrative  . Not on file     ROS: Negative.     PE: HEENT: Negative. Lungs: Clear. Cardio: RR.  Assessment/Plan:  Proceed with planned endoscopy.  Paul Choi [facility] 09/02/2020

## 2020-09-02 NOTE — Op Note (Signed)
[facility] Gastroenterology Patient Name: Paul Choi Procedure Date: 09/02/2020 7:27 AM MRN: [id number] Account #: [id number] Date of Birth: May 02, 1946 Admit Type: Outpatient Age: 75 Room: [facility] ENDO ROOM 1 Gender: Male Note Status: Finalized Procedure:             Colonoscopy Indications:           High risk colon cancer surveillance: Personal history                         of colonic polyps Providers:             Robert Bellow, MD Referring MD:          Ramonita Lab, MD (Referring MD) Medicines:             Monitored Anesthesia Care Complications:         No immediate complications. Procedure:             Pre-Anesthesia Assessment:                        - Prior to the procedure, a History and Physical was                         performed, and patient medications, allergies and                         sensitivities were reviewed. The patient's tolerance                         of previous anesthesia was reviewed.                        - The risks and benefits of the procedure and the                         sedation options and risks were discussed with the                         patient. All questions were answered and informed                         consent was obtained.                        After obtaining informed consent, the colonoscope was                         passed under direct vision. Throughout the procedure,                         the patient's blood pressure, pulse, and oxygen                         saturations were monitored continuously. The                         Colonoscope was introduced through the anus and                         advanced to the the cecum, identified  by appendiceal                         orifice and ileocecal valve. The colonoscopy was                         unusually difficult due to significant looping.                         Successful completion of the procedure was aided by                          changing the patient to a prone position. The patient                         tolerated the procedure well. The quality of the bowel                         preparation was excellent. Findings:      Four semi-pedunculated polyps were found in the descending colon,       splenic flexure, transverse colon and hepatic flexure. The polyps were 6       mm in size. These were biopsied with a cold forceps for histology.      The retroflexed view of the distal rectum and anal verge was normal and       showed no anal or rectal abnormalities. Impression:            - Four 6 mm polyps in the descending colon, at the                         splenic flexure, in the transverse colon and at the                         hepatic flexure. Biopsied.                        - The distal rectum and anal verge are normal on                         retroflexion view. Recommendation:        - Telephone endoscopist for pathology results in 1                         week. Procedure Code(s):     --- Professional ---                        [phone], Colonoscopy, flexible; with biopsy, single or                         multiple Diagnosis Code(s):     --- Professional ---                        Z86.010, Personal history of colonic polyps                        K63.5, Polyp of colon CPT copyright 2019 American Medical Association. All rights reserved. The codes documented in this report are preliminary and upon  coder review may  be revised to meet current compliance requirements. Robert Bellow, MD 09/02/2020 8:12:10 AM This report has been signed electronically. Number of Addenda: 0 Note Initiated On: 09/02/2020 7:27 AM Scope Withdrawal Time: 0 hours 11 minutes 54 seconds  Total Procedure Duration: 0 hours 35 minutes 21 seconds  Estimated Blood Loss:  Estimated blood loss: none.      [facility]

## 2020-09-02 NOTE — Anesthesia Postprocedure Evaluation (Signed)
Anesthesia Post Note  Patient: Paul Choi  Procedure(s) Performed: COLONOSCOPY WITH PROPOFOL (N/A )  Patient location during evaluation: Endoscopy Anesthesia Type: General Level of consciousness: awake and alert Pain management: pain level controlled Vital Signs Assessment: post-procedure vital signs reviewed and stable Respiratory status: spontaneous breathing, nonlabored ventilation, respiratory function stable and patient connected to nasal cannula oxygen Cardiovascular status: blood pressure returned to baseline and stable Postop Assessment: no apparent nausea or vomiting Anesthetic complications: no   No complications documented.   Last Vitals:  Vitals:   09/02/20 0705 09/02/20 0813  BP: (!) 161/95 (!) 83/55  Pulse: 62 67  Resp: 16 10  Temp: (!) 35.9 C (!) 36.1 C  SpO2: 100% 99%    Last Pain:  Vitals:   09/02/20 0813  TempSrc: Temporal  PainSc: Asleep                 Arita Miss

## 2020-09-02 NOTE — Anesthesia Preprocedure Evaluation (Addendum)
Anesthesia Evaluation  Patient identified by MRN, date of birth, ID band Patient awake    Reviewed: Allergy & Precautions, H&P , NPO status , Patient's Chart, lab work & pertinent test results  History of Anesthesia Complications Negative for: history of anesthetic complications  Airway Mallampati: III  TM Distance: <3 FB Neck ROM: limited    Dental  (+) Chipped, Poor Dentition, Missing, Edentulous Upper, Upper Dentures   Pulmonary neg shortness of breath, former smoker,    breath sounds clear to auscultation       Cardiovascular Exercise Tolerance: Good hypertension, (-) angina+ CAD and + Cardiac Stents  (-) Past MI and (-) DOE + dysrhythmias Atrial Fibrillation  Rhythm:Regular Rate:Normal     Neuro/Psych negative neurological ROS  negative psych ROS   GI/Hepatic negative GI ROS, Neg liver ROS, neg GERD  ,  Endo/Other  diabetes, Type 2  Renal/GU CRFRenal disease  negative genitourinary   Musculoskeletal  (+) Arthritis ,   Abdominal   Peds  Hematology negative hematology ROS (+)   Anesthesia Other Findings Past Medical History: No date: Arteriosclerotic cardiovascular disease 1997: Arthritis No date: Coronary artery disease     Comment:  Cardiac catheterization in August 2003 with occluded               left circumflex. Echo 8/13 with mild segmental LV               dysfunction, EF 40%, moderate mitral insufficiency and               mild tricuspid insufficiency; aortic valve appears               bicuspid No date: Diabetes mellitus without complication (HCC)     Comment:  TYPE 2  No date: ED (erectile dysfunction) No date: Gout No date: Gout No date: Heart disease No date: Hyperlipidemia 1982: Hypertension No date: Insomnia No date: Personal history of colonic polyps No date: Renal insufficiency  Past Surgical History: 2006: CARDIAC CATHETERIZATION 2007: COLONOSCOPY 05/04/2016: CORONARY STENT  PLACEMENT     Comment:  proximal RCA treated successfully with coronary stenting              using a 3.5 x 16 mm Promus drug-eluting stent 05/04/2016: CORONARY STENT PLACEMENT     Comment:  Mid LAD lesion, 30 %stenosed. 08/23/2006: HERNIA REPAIR; Left     Comment:  Direct inguinal hernia repair with a large ultra Pro               mesh. 1988: KNEE SURGERY  BMI    Body Mass Index:  24.68 kg/m      Reproductive/Obstetrics negative OB ROS                             Anesthesia Physical  Anesthesia Plan  ASA: III  Anesthesia Plan: General   Post-op Pain Management:    Induction: Intravenous  PONV Risk Score and Plan: 2 and Propofol infusion  Airway Management Planned: Natural Airway and Nasal Cannula  Additional Equipment:   Intra-op Plan:   Post-operative Plan:   Informed Consent: I have reviewed the patients History and Physical, chart, labs and discussed the procedure including the risks, benefits and alternatives for the proposed anesthesia with the patient or authorized representative who has indicated his/her understanding and acceptance.     Dental Advisory Given  Plan Discussed with: Anesthesiologist, CRNA and Surgeon  Anesthesia Plan Comments: (Patient  consented for risks of anesthesia including but not limited to:  - adverse reactions to medications - risk of intubation if required - damage to teeth, lips or other oral mucosa - sore throat or hoarseness - Damage to heart, brain, lungs or loss of life  Patient voiced understanding.)        Anesthesia Quick Evaluation

## 2020-09-02 NOTE — Transfer of Care (Signed)
Immediate Anesthesia Transfer of Care Note  Patient: Paul Choi  Procedure(s) Performed: COLONOSCOPY WITH PROPOFOL (N/A )  Patient Location: PACU  Anesthesia Type:General  Level of Consciousness: sedated  Airway & Oxygen Therapy: Patient Spontanous Breathing and Patient connected to nasal cannula oxygen  Post-op Assessment: Report given to RN and Post -op Vital signs reviewed and stable  Post vital signs: Reviewed and stable  Last Vitals:  Vitals Value Taken Time  BP 83/55 09/02/20 0814  Temp    Pulse 67 09/02/20 0814  Resp 11 09/02/20 0814  SpO2 99 % 09/02/20 0814  Vitals shown include unvalidated device data.  Last Pain:  Vitals:   09/02/20 0705  TempSrc: Temporal  PainSc: 0-No pain         Complications: No complications documented.

## 2020-09-05 ENCOUNTER — Encounter: Payer: Self-pay | Admitting: General Surgery

## 2020-09-06 LAB — SURGICAL PATHOLOGY

## 2020-09-27 ENCOUNTER — Ambulatory Visit (INDEPENDENT_AMBULATORY_CARE_PROVIDER_SITE_OTHER): Payer: Medicare Other | Admitting: Dermatology

## 2020-09-27 ENCOUNTER — Other Ambulatory Visit: Payer: Self-pay

## 2020-09-27 ENCOUNTER — Encounter: Payer: Self-pay | Admitting: Dermatology

## 2020-09-27 DIAGNOSIS — Z86018 Personal history of other benign neoplasm: Secondary | ICD-10-CM | POA: Diagnosis not present

## 2020-09-27 NOTE — Patient Instructions (Signed)
Recommend daily broad spectrum sunscreen SPF 30+ to sun-exposed areas, reapply every 2 hours as needed. Call for new or changing lesions. Melanoma ABCDEs  Melanoma is the most dangerous type of skin cancer, and is the leading cause of death from skin disease.  You are more likely to develop melanoma if you:  Have light-colored skin, light-colored eyes, or red or blond hair  Spend a lot of time in the sun  Tan regularly, either outdoors or in a tanning bed  Have had blistering sunburns, especially during childhood  Have a close family member who has had a melanoma  Have atypical moles or large birthmarks  Early detection of melanoma is key since treatment is typically straightforward and cure rates are extremely high if we catch it early.   The first sign of melanoma is often a change in a mole or a new dark spot.  The ABCDE system is a way of remembering the signs of melanoma.  A for asymmetry:  The two halves do not match. B for border:  The edges of the growth are irregular. C for color:  A mixture of colors are present instead of an even brown color. D for diameter:  Melanomas are usually (but not always) greater than 68mm - the size of a pencil eraser. E for evolution:  The spot keeps changing in size, shape, and color.  Please check your skin once per month between visits. You can use a small mirror in front and a large mirror behind you to keep an eye on the back side or your body.   If you see any new or changing lesions before your next follow-up, please call to schedule a visit.  Please continue daily skin protection including broad spectrum sunscreen SPF 30+ to sun-exposed areas, reapplying every 2 hours as needed when you're outdoors.

## 2020-09-27 NOTE — Progress Notes (Signed)
   Follow-Up Visit   Subjective  Paul Choi is a 75 y.o. male who presents for the following: Follow-up (Hx of Dysplastic nevus left lateral inferior chest removed 05/24/2020). He has not noticed any coming back.   The following portions of the chart were reviewed this encounter and updated as appropriate:   Tobacco  Allergies  Meds  Problems  Med Hx  Surg Hx  Fam Hx      Review of Systems:  No other skin or systemic complaints except as noted in HPI or Assessment and Plan.  Objective  Well appearing patient in no apparent distress; mood and affect are within normal limits.  A focused examination was performed including chest. Relevant physical exam findings are noted in the Assessment and Plan.  Objective  Left lateral inferior chest: Scar with no evidence of recurrence.    Assessment & Plan  History of dysplastic nevus Left lateral inferior chest  Clear. Observe for recurrence. Call clinic for new or changing lesions.  Recommend regular skin exams, daily broad-spectrum spf 30+ sunscreen use, and photoprotection.     Return in about 8 months (around 05/25/2021) for TBSE as scheduled .   I, Marye Round, CMA, am acting as scribe for Forest Gleason, MD .  Documentation: I have reviewed the above documentation for accuracy and completeness, and I agree with the above.  Forest Gleason, MD

## 2020-11-28 ENCOUNTER — Other Ambulatory Visit: Payer: Self-pay

## 2020-11-28 ENCOUNTER — Ambulatory Visit: Payer: Medicare Other | Attending: Internal Medicine

## 2020-11-28 DIAGNOSIS — Z23 Encounter for immunization: Secondary | ICD-10-CM

## 2020-11-28 MED ORDER — PFIZER-BIONT COVID-19 VAC-TRIS 30 MCG/0.3ML IM SUSP
INTRAMUSCULAR | 0 refills | Status: DC
  Filled 2020-11-28: qty 0.3, 20d supply, fill #0

## 2020-11-28 NOTE — Progress Notes (Signed)
   Covid-19 Vaccination Clinic  Name:  Paul Choi    MRN: [id number] DOB: [birth year]  11/28/2020  [name] was observed post Covid-19 immunization for 15 minutes without incident. He was provided with Vaccine Information Sheet and instruction to access the V-Safe system.   [name] was instructed to call 911 with any severe reactions post vaccine: Marland Kitchen Difficulty breathing  . Swelling of face and throat  . A fast heartbeat  . A bad rash all over body  . Dizziness and weakness   Immunizations Administered    Name Date Dose VIS Date Route   PFIZER Comrnaty(Gray TOP) Covid-19 Vaccine 11/28/2020 10:49 AM 0.3 mL 07/28/2020 Intramuscular   Manufacturer: [employer]   Lot: [id number]   [place]: [phone]

## 2021-05-19 ENCOUNTER — Other Ambulatory Visit: Payer: Self-pay | Admitting: Internal Medicine

## 2021-05-19 ENCOUNTER — Ambulatory Visit: Payer: Medicare Other

## 2021-05-19 NOTE — Telephone Encounter (Signed)
This is a Person pt 

## 2021-05-25 ENCOUNTER — Encounter: Payer: Medicare Other | Admitting: Dermatology

## 2021-06-20 ENCOUNTER — Other Ambulatory Visit: Payer: Self-pay

## 2021-06-20 ENCOUNTER — Ambulatory Visit (INDEPENDENT_AMBULATORY_CARE_PROVIDER_SITE_OTHER): Payer: Medicare Other | Admitting: Dermatology

## 2021-06-20 DIAGNOSIS — D229 Melanocytic nevi, unspecified: Secondary | ICD-10-CM

## 2021-06-20 DIAGNOSIS — L578 Other skin changes due to chronic exposure to nonionizing radiation: Secondary | ICD-10-CM

## 2021-06-20 DIAGNOSIS — Z1283 Encounter for screening for malignant neoplasm of skin: Secondary | ICD-10-CM

## 2021-06-20 DIAGNOSIS — L814 Other melanin hyperpigmentation: Secondary | ICD-10-CM

## 2021-06-20 DIAGNOSIS — D1800 Hemangioma unspecified site: Secondary | ICD-10-CM

## 2021-06-20 DIAGNOSIS — L720 Epidermal cyst: Secondary | ICD-10-CM | POA: Diagnosis not present

## 2021-06-20 DIAGNOSIS — Z86018 Personal history of other benign neoplasm: Secondary | ICD-10-CM

## 2021-06-20 DIAGNOSIS — B353 Tinea pedis: Secondary | ICD-10-CM

## 2021-06-20 DIAGNOSIS — L821 Other seborrheic keratosis: Secondary | ICD-10-CM

## 2021-06-20 NOTE — Patient Instructions (Addendum)
Recommend OTC terbinafine or lamisil twice daily to feet and in between toes until no longer scaly then for one more week. May continue once weekly for maintenance.  Melanoma ABCDEs  Melanoma is the most dangerous type of skin cancer, and is the leading cause of death from skin disease.  You are more likely to develop melanoma if you: Have light-colored skin, light-colored eyes, or red or blond hair Spend a lot of time in the sun Tan regularly, either outdoors or in a tanning bed Have had blistering sunburns, especially during childhood Have a close family member who has had a melanoma Have atypical moles or large birthmarks  Early detection of melanoma is key since treatment is typically straightforward and cure rates are extremely high if we catch it early.   The first sign of melanoma is often a change in a mole or a new dark spot.  The ABCDE system is a way of remembering the signs of melanoma.  A for asymmetry:  The two halves do not match. B for border:  The edges of the growth are irregular. C for color:  A mixture of colors are present instead of an even brown color. D for diameter:  Melanomas are usually (but not always) greater than 52mm - the size of a pencil eraser. E for evolution:  The spot keeps changing in size, shape, and color.  Please check your skin once per month between visits. You can use a small mirror in front and a large mirror behind you to keep an eye on the back side or your body.   If you see any new or changing lesions before your next follow-up, please call to schedule a visit.  Please continue daily skin protection including broad spectrum sunscreen SPF 30+ to sun-exposed areas, reapplying every 2 hours as needed when you're outdoors.    If you have any questions or concerns for your doctor, please call our main line at [phone] and press option 4 to reach your doctor's medical assistant. If no one answers, please leave a voicemail as directed and we  will return your call as soon as possible. Messages left after 4 pm will be answered the following business day.   You may also send [place] a message via [place]. We typically respond to MyChart messages within 1-2 business days.  For prescription refills, please ask your pharmacy to contact our office. Our fax number is [phone].  If you have an urgent issue when the clinic is closed that cannot wait until the next business day, you can page your doctor at the number below.    Please note that while we do our best to be available for urgent issues outside of office hours, we are not available 24/7.   If you have an urgent issue and are unable to reach [place], you may choose to seek medical care at your doctor's office, retail clinic, urgent care center, or emergency room.  If you have a medical emergency, please immediately call 911 or go to the emergency department.  Pager Numbers  - Dr. Nehemiah Massed: [phone]  - Dr. Laurence Ferrari: [phone]  - Dr. Nicole Kindred: [phone]  In the event of inclement weather, please call our main line at [phone] for an update on the status of any delays or closures.  Dermatology Medication Tips: Please keep the boxes that topical medications come in in order to help keep track of the instructions about where and how to use these. Pharmacies typically print the medication instructions  only on the boxes and not directly on the medication tubes.   If your medication is too expensive, please contact our office at [phone] option 4 or send [place] a message through [place].   We are unable to tell what your co-pay for medications will be in advance as this is different depending on your insurance coverage. However, we may be able to find a substitute medication at lower cost or fill out paperwork to get insurance to cover a needed medication.   If a prior authorization is required to get your medication covered by your insurance company, please allow [place] 1-2  business days to complete this process.  Drug prices often vary depending on where the prescription is filled and some pharmacies may offer cheaper prices.  The website www.goodrx.com contains coupons for medications through different pharmacies. The prices here do not account for what the cost may be with help from insurance (it may be cheaper with your insurance), but the website can give you the price if you did not use any insurance.  - You can print the associated coupon and take it with your prescription to the pharmacy.  - You may also stop by our office during regular business hours and pick up a GoodRx coupon card.  - If you need your prescription sent electronically to a different pharmacy, notify our office through [facility] or by phone at [phone] option 4.

## 2021-06-20 NOTE — Progress Notes (Signed)
Follow-Up Visit   Subjective  Paul Choi is a 75 y.o. male who presents for the following: FBSE (Patient here for full body skin exam and skin cancer screening. Patient with hx of dysplastic nevi. Patient not aware of any new or changing spots today.).  Patient does have a spot at right axilla that has been there for a long time, no changes in it.   The following portions of the chart were reviewed this encounter and updated as appropriate:   Tobacco  Allergies  Meds  Problems  Med Hx  Surg Hx  Fam Hx      Review of Systems:  No other skin or systemic complaints except as noted in HPI or Assessment and Plan.  Objective  Well appearing patient in no apparent distress; mood and affect are within normal limits.  A full examination was performed including scalp, head, eyes, ears, nose, lips, neck, chest, axillae, abdomen, back, buttocks, bilateral upper extremities, bilateral lower extremities, hands, feet, fingers, toes, fingernails, and toenails. All findings within normal limits unless otherwise noted below.  bilateral feet Scaling and maceration web spaces and over distal and lateral soles.   Mid Back Subcutaneous nodule.    Assessment & Plan  Tinea pedis of both feet bilateral feet  Recommend OTC terbinafine or lamisil twice daily to feet and in between toes until no longer scaly then for one more week. May continue once weekly for maintenance.  Epidermal inclusion cyst Mid Back  Benign-appearing. Exam most consistent with an epidermal inclusion cyst. Discussed that a cyst is a benign growth that can grow over time and sometimes get irritated or inflamed. Recommend observation if it is not bothersome. Discussed option of surgical excision to remove it if it is growing, symptomatic, or other changes noted. Please call for new or changing lesions so they can be evaluated.    Lentigines - Scattered tan macules - Due to sun exposure - Benign-appearing, observe -  Recommend daily broad spectrum sunscreen SPF 30+ to sun-exposed areas, reapply every 2 hours as needed. - Call for any changes  Seborrheic Keratoses - Stuck-on, waxy, tan-brown papules and/or plaques  - Benign-appearing - Discussed benign etiology and prognosis. - Observe - Call for any changes  Melanocytic Nevi - Tan-brown and/or pink-flesh-colored symmetric macules and papules - Benign appearing on exam today - Observation - Call clinic for new or changing moles - Recommend daily use of broad spectrum spf 30+ sunscreen to sun-exposed areas.   Hemangiomas - Red papules - Discussed benign nature - Observe - Call for any changes  Actinic Damage - Chronic condition, secondary to cumulative UV/sun exposure - diffuse scaly erythematous macules with underlying dyspigmentation - Recommend daily broad spectrum sunscreen SPF 30+ to sun-exposed areas, reapply every 2 hours as needed.  - Staying in the shade or wearing long sleeves, sun glasses (UVA+UVB protection) and wide brim hats (4-inch brim around the entire circumference of the hat) are also recommended for sun protection.  - Call for new or changing lesions.  Skin cancer screening performed today.  History of Dysplastic Nevi - No evidence of recurrence today - Recommend regular full body skin exams - Recommend daily broad spectrum sunscreen SPF 30+ to sun-exposed areas, reapply every 2 hours as needed.  - Call if any new or changing lesions are noted between office visits  Return in about 1 year (around 06/20/2022) for TBSE.  Graciella Belton, RMA, am acting as scribe for Forest Gleason, MD .  Documentation: I have  reviewed the above documentation for accuracy and completeness, and I agree with the above.  Forest Gleason, MD

## 2021-07-11 ENCOUNTER — Ambulatory Visit: Payer: Medicare Other | Attending: Internal Medicine

## 2021-07-11 ENCOUNTER — Other Ambulatory Visit: Payer: Self-pay

## 2021-07-11 DIAGNOSIS — Z23 Encounter for immunization: Secondary | ICD-10-CM

## 2021-07-11 MED ORDER — PFIZER COVID-19 VAC BIVALENT 30 MCG/0.3ML IM SUSP
INTRAMUSCULAR | 0 refills | Status: DC
  Filled 2021-07-11: qty 0.3, 1d supply, fill #0

## 2021-07-11 NOTE — Progress Notes (Signed)
   Covid-19 Vaccination Clinic  Name:  Paul Choi    MRN: [id number] DOB: Jan 11, 1946  07/11/2021  Mr. Paul Choi was observed post Covid-19 immunization for 15 minutes without incident. He was provided with Vaccine Information Sheet and instruction to access the V-Safe system.   Mr. Paul Choi was instructed to call 911 with any severe reactions post vaccine: Difficulty breathing  Swelling of face and throat  A fast heartbeat  A bad rash all over body  Dizziness and weakness   Immunizations Administered     Name Date Dose VIS Date Route   Pfizer Covid-19 Vaccine Bivalent Booster 07/11/2021  9:56 AM 0.3 mL 04/19/2021 Intramuscular   Manufacturer: [employer]   Lot: [id number]   [place]: [place], PharmD, MBA Clinical Acute Care Pharmacist

## 2021-07-20 ENCOUNTER — Other Ambulatory Visit: Payer: Self-pay | Admitting: Internal Medicine

## 2021-07-20 NOTE — Telephone Encounter (Signed)
This is a [place] pt 

## 2021-08-24 ENCOUNTER — Ambulatory Visit (INDEPENDENT_AMBULATORY_CARE_PROVIDER_SITE_OTHER): Payer: Medicare Other | Admitting: Internal Medicine

## 2021-08-24 ENCOUNTER — Other Ambulatory Visit: Payer: Self-pay

## 2021-08-24 ENCOUNTER — Encounter: Payer: Self-pay | Admitting: Internal Medicine

## 2021-08-24 VITALS — BP 170/90 | HR 72 | Ht 72.0 in | Wt 196.0 lb

## 2021-08-24 DIAGNOSIS — I10 Essential (primary) hypertension: Secondary | ICD-10-CM

## 2021-08-24 DIAGNOSIS — I2510 Atherosclerotic heart disease of native coronary artery without angina pectoris: Secondary | ICD-10-CM

## 2021-08-24 DIAGNOSIS — I493 Ventricular premature depolarization: Secondary | ICD-10-CM

## 2021-08-24 DIAGNOSIS — R002 Palpitations: Secondary | ICD-10-CM

## 2021-08-24 NOTE — Progress Notes (Signed)
Patient Care Team: Adin Hector, MD as PCP - General (Internal Medicine) Bary Castilla Forest Gleason, MD (General Surgery)   HPI  Paul Choi is a 76 y.o. male seen in followup for CAD  The patient denies chest pain, shortness of breath, nocturnal dyspne, orthopnea .  There have been no palpitations, lightheadedness or syncope.  Complains of peripheral edema.  His diet is replete with intrinsic sodium.  He does not add salt..       Given the absence of symptoms, he was referred for preeval with Dr [facility] who after exhaustive evaluation,  LHC 9/17 >>   1. Critical stenosis of the proximal RCA treated successfully with coronary stenting using a 3.5 x 16 mm Promus drug-eluting stent 2. Chronic occlusion of the mid circumflex with the first OM branch collateralized from the LAD and right coronary artery 3. Moderate stenosis of a large ramus intermedius branch 4. Diffuse nonobstructive stenosis of the LAD     DATE TEST EF   5/18 Echo   45-+50 % Ao 4.0/4.1No stenosis  6/19 Echo   55-60 % Ao 41/42 No stenosis  9/20 Echo  50-55% Ao 29mm    Date Cr K Hgb LDL  9/17 1.55 4.7    3/19 1.4 4.9 14.1 77  3/20 1.4 4.3 14.6 73  9/21 1.6 4.7 13.4 76 (4/21)  7/22 1.3 4.4  77    Failed to tolerate rosuvastatin  Back on atorvastatin       Past Medical History:  Diagnosis Date   Arteriosclerotic cardiovascular disease    Arthritis 1997   Coronary artery disease    Cardiac catheterization in August 2003 with occluded left circumflex. Echo 8/13 with mild segmental LV dysfunction, EF 40%, moderate mitral insufficiency and mild tricuspid insufficiency; aortic valve appears bicuspid   Diabetes mellitus without complication (HCC)    TYPE 2    Dysplastic nevus 09/27/2017   Left flank. Mild atypia, irritated, limited margins free.   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 Severe atypia, close to lateral margin. Excised 10/23/2017, margins free.   Dysplastic nevus 10/09/2018    Right lower abdomen. Moderate atypia, limited margins free.    Dysplastic nevus 04/02/2019   Low back mid line. Moderate atypia, irritated, limited magins free.   ED (erectile dysfunction)    Gout    Gout    Heart disease    History of dysplastic nevus 05/18/2020   left lateral inferior chest, moderate   Hyperlipidemia    Hypertension 1982   Insomnia    Personal history of colonic polyps    Renal insufficiency     Past Surgical History:  Procedure Laterality Date   CARDIAC CATHETERIZATION  2006   CARDIAC CATHETERIZATION N/A 05/04/2016   Procedure: Left Heart Cath and Coronary Angiography;  Surgeon: Sherren Mocha, MD;  Location: [place] [facility];  Service: Cardiovascular;  Laterality: N/A;   CARDIAC CATHETERIZATION N/A 05/04/2016   Procedure: Coronary Stent Intervention;  Surgeon: Sherren Mocha, MD;  Location: [place] [facility];  Service: Cardiovascular;  Laterality: N/A;   COLONOSCOPY  2007   COLONOSCOPY WITH PROPOFOL N/A 06/26/2017   Procedure: COLONOSCOPY WITH PROPOFOL;  Surgeon: Robert Bellow, MD;  Location: [facility] ENDOSCOPY;  Service: Endoscopy;  Laterality: N/A;   COLONOSCOPY WITH PROPOFOL N/A 09/02/2020   Procedure: COLONOSCOPY WITH PROPOFOL;  Surgeon: Robert Bellow, MD;  Location: [facility] ENDOSCOPY;  Service: Endoscopy;  Laterality: N/A;  COVID POSITIVE AT [facility] ON 08/22/2020 -  ON CHART   CORONARY STENT PLACEMENT  05/04/2016   proximal RCA treated successfully with coronary stenting using a 3.5 x 16 mm Promus drug-eluting stent   CORONARY STENT PLACEMENT  05/04/2016   Mid LAD lesion, 30 %stenosed.   HERNIA REPAIR Left 08/23/2006   Direct inguinal hernia repair with a large ultra Pro mesh.   KNEE SURGERY  1988    Current Outpatient Medications  Medication Sig Dispense Refill   allopurinol (ZYLOPRIM) 100 MG tablet Take 200 mg by mouth every evening.      amLODipine (NORVASC) 5 MG tablet Take 5 mg by mouth every evening.      aspirin EC 81 MG tablet Take 81 mg by mouth  daily.     atorvastatin (LIPITOR) 40 MG tablet TAKE 1 TABLET BY MOUTH  DAILY 90 tablet 0   cholecalciferol (VITAMIN D) 1000 units tablet Take 1,000 Units by mouth every morning.      colchicine 0.6 MG tablet Take 0.6 mg by mouth daily as needed (for gout flare ups.).      docusate sodium (COLACE) 100 MG capsule Take 200-300 mg by mouth daily as needed for mild constipation.     losartan (COZAAR) 100 MG tablet Take 1 tablet (100 mg total) by mouth every evening.     No current facility-administered medications for this visit.    Allergies  Allergen Reactions   Ace Inhibitors Cough   Contrast Media [Iodinated Contrast Media] Other (See Comments)    Cough and difficulty breathing    Lovastatin Other (See Comments)    Myalgia at 80-mg dose   Other Nausea Only, Hives and Cough    Contrast media  Had difficulty breathing   Shellfish Allergy Hives   Trazodone Other (See Comments)    "felt like zombie"      Review of Systems negative except from HPI and PMH  Physical Exam BP (!) 170/90 (BP Location: Left Arm, Patient Position: Sitting, Cuff Size: Normal)    Pulse 72    Ht 6' (1.829 m)    Wt 196 lb (88.9 kg)    SpO2 98%    BMI 26.58 kg/m  Well developed and nourished in no acute distress HENT normal Neck supple with JVP-  flat   Clear Regular rate and rhythm, no murmurs or gallops Abd-soft with active BS No Clubbing cyanosis edema Skin-warm and dry A & Oriented  Grossly normal sensory and motor function  ECG sinus at 72 Normal 20/10/40  Assessment/plan  Palpitations-PVC   Coronary artery disease with an occluded circumflex, left-right collaterals 2003  High-grade RCA stenosis status post DES   Cardiomyopathy  Resolved  >>>>>>Mitral regurgitation-moderate>>mild  Bicuspid aortic valve/mild aortic root dilitation   Hypertension  PVCs are quiescient  Blood pressure is elevated.  Recheck was still 160+.  He is on amlodipine 5 and losartan 100, we will continue.  He has  peripheral edema which precludes uptitrating amlodipine.  He may benefit from adjunctive spironolactone.  He is also salt replete in his dietary intake and have encouraged him to try to eat lower sodium foods.  No ischemia.  Continue aspirin 81.  LDL is not at target.  I think rather than PCSK9, the recent data on the adjunctive use of ezetimibe is provocative and would recommend the adding of this.  He is to see his PCP at the end of the month.  He is going to bring blood pressure from home and he can discuss his LDL at that time.  PVC quiescent  BP significantly elevated  Reviewed endpoint data and the importance of target of at least 130, noting also the recent [place] study showing incremental benefit from 115 mm  Without symptoms of ischemia  LDL as good as we are going to do , and is close enuf to target that I dont think PCSK9 Rx is indicated  Aortic root without change over years, will repeat next year    .

## 2021-08-24 NOTE — Patient Instructions (Signed)
Medication Instructions:  - Your physician recommends that you continue on your current medications as directed. Please refer to the Current Medication list given to you today.  *If you need a refill on your cardiac medications before your next appointment, please call your pharmacy*   Lab Work: - none ordered  If you have labs (blood work) drawn today and your tests are completely normal, you will receive your results only by: [place] (if you have MyChart) OR A paper copy in the mail If you have any lab test that is abnormal or we need to change your treatment, we will call you to review the results.   Testing/Procedures: - none ordered   Follow-Up: At [facility], you and your health needs are our priority.  As part of our continuing mission to provide you with exceptional heart care, we have created designated Provider Care Teams.  These Care Teams include your primary Cardiologist (physician) and Advanced Practice Providers (APPs -  Physician Assistants and Nurse Practitioners) who all work together to provide you with the care you need, when you need it.  We recommend signing up for the patient portal called "MyChart".  Sign up information is provided on this After Visit Summary.  MyChart is used to connect with patients for Virtual Visits (Telemedicine).  Patients are able to view lab/test results, encounter notes, upcoming appointments, etc.  Non-urgent messages can be sent to your provider as well.   To learn more about what you can do with MyChart, go to [email or web address].    Your next appointment:   1 year(s)  The format for your next appointment:   In Person  Provider:   Virl Axe, MD    Other Instructions - Monitor blood pressures at home to review with Dr. Ramonita Lab at your next visit with him

## 2021-10-06 ENCOUNTER — Other Ambulatory Visit: Payer: Self-pay | Admitting: Internal Medicine

## 2022-06-11 ENCOUNTER — Other Ambulatory Visit: Payer: Self-pay

## 2022-06-11 MED ORDER — COVID-19 MRNA 2023-2024 VACCINE (COMIRNATY) 0.3 ML INJECTION
0.3000 mL | Freq: Once | INTRAMUSCULAR | 0 refills | Status: DC
  Filled 2022-06-11: qty 0.3, 1d supply, fill #0

## 2022-06-21 ENCOUNTER — Ambulatory Visit (INDEPENDENT_AMBULATORY_CARE_PROVIDER_SITE_OTHER): Payer: Medicare Other | Admitting: Dermatology

## 2022-06-21 ENCOUNTER — Encounter: Payer: Self-pay | Admitting: Dermatology

## 2022-06-21 DIAGNOSIS — Z86018 Personal history of other benign neoplasm: Secondary | ICD-10-CM

## 2022-06-21 DIAGNOSIS — L570 Actinic keratosis: Secondary | ICD-10-CM

## 2022-06-21 DIAGNOSIS — L814 Other melanin hyperpigmentation: Secondary | ICD-10-CM | POA: Diagnosis not present

## 2022-06-21 DIAGNOSIS — Z1283 Encounter for screening for malignant neoplasm of skin: Secondary | ICD-10-CM | POA: Diagnosis not present

## 2022-06-21 DIAGNOSIS — D492 Neoplasm of unspecified behavior of bone, soft tissue, and skin: Secondary | ICD-10-CM

## 2022-06-21 DIAGNOSIS — L821 Other seborrheic keratosis: Secondary | ICD-10-CM | POA: Diagnosis not present

## 2022-06-21 DIAGNOSIS — L578 Other skin changes due to chronic exposure to nonionizing radiation: Secondary | ICD-10-CM

## 2022-06-21 DIAGNOSIS — D2271 Melanocytic nevi of right lower limb, including hip: Secondary | ICD-10-CM | POA: Diagnosis not present

## 2022-06-21 DIAGNOSIS — D229 Melanocytic nevi, unspecified: Secondary | ICD-10-CM

## 2022-06-21 NOTE — Patient Instructions (Addendum)
Cryotherapy Aftercare  Wash gently with soap and water everyday.   Apply Vaseline and Band-Aid daily until healed.    Wound Care Instructions  Cleanse wound gently with soap and water once a day then pat dry with clean gauze. Apply a thin coat of Petrolatum (petroleum jelly, "Vaseline") over the wound (unless you have an allergy to this). We recommend that you use a new, sterile tube of Vaseline. Do not pick or remove scabs. Do not remove the yellow or white "healing tissue" from the base of the wound.  Cover the wound with fresh, clean, nonstick gauze and secure with paper tape. You may use Band-Aids in place of gauze and tape if the wound is small enough, but would recommend trimming much of the tape off as there is often too much. Sometimes Band-Aids can irritate the skin.  You should call the office for your biopsy report after 1 week if you have not already been contacted.  If you experience any problems, such as abnormal amounts of bleeding, swelling, significant bruising, significant pain, or evidence of infection, please call the office immediately.  FOR ADULT SURGERY PATIENTS: If you need something for pain relief you may take 1 extra strength Tylenol (acetaminophen) AND 2 Ibuprofen ('200mg'$  each) together every 4 hours as needed for pain. (do not take these if you are allergic to them or if you have a reason you should not take them.) Typically, you may only need pain medication for 1 to 3 days.    Recommend daily broad spectrum sunscreen SPF 30+ to sun-exposed areas, reapply every 2 hours as needed. Call for new or changing lesions.  Staying in the shade or wearing long sleeves, sun glasses (UVA+UVB protection) and wide brim hats (4-inch brim around the entire circumference of the hat) are also recommended for sun protection.    Recommend taking Heliocare sun protection supplement daily in sunny weather for additional sun protection. For maximum protection on the sunniest days, you can  take up to 2 capsules of regular Heliocare OR take 1 capsule of Heliocare Ultra. For prolonged exposure (such as a full day in the sun), you can repeat your dose of the supplement 4 hours after your first dose. Heliocare can be purchased at [employer], at some [facility] or at [email or web address].    Due to recent changes in healthcare laws, you may see results of your pathology and/or laboratory studies on MyChart before the doctors have had a chance to review them. We understand that in some cases there may be results that are confusing or concerning to you. Please understand that not all results are received at the same time and often the doctors may need to interpret multiple results in order to provide you with the best plan of care or course of treatment. Therefore, we ask that you please give [place] 2 business days to thoroughly review all your results before contacting the office for clarification. Should we see a critical lab result, you will be contacted sooner.   If You Need Anything After Your Visit  If you have any questions or concerns for your doctor, please call our main line at [phone] and press option 4 to reach your doctor's medical assistant. If no one answers, please leave a voicemail as directed and we will return your call as soon as possible. Messages left after 4 pm will be answered the following business day.   You may also send [place] a message via [place]. We typically respond to MyChart  messages within 1-2 business days.  For prescription refills, please ask your pharmacy to contact our office. Our fax number is [phone].  If you have an urgent issue when the clinic is closed that cannot wait until the next business day, you can page your doctor at the number below.    Please note that while we do our best to be available for urgent issues outside of office hours, we are not available 24/7.   If you have an urgent issue and are unable to reach [place], you may choose  to seek medical care at your doctor's office, retail clinic, urgent care center, or emergency room.  If you have a medical emergency, please immediately call 911 or go to the emergency department.  Pager Numbers  - Dr. Nehemiah Massed: [phone]  - Dr. Laurence Ferrari: [phone]  - Dr. Nicole Kindred: [phone]  In the event of inclement weather, please call our main line at [phone] for an update on the status of any delays or closures.  Dermatology Medication Tips: Please keep the boxes that topical medications come in in order to help keep track of the instructions about where and how to use these. Pharmacies typically print the medication instructions only on the boxes and not directly on the medication tubes.   If your medication is too expensive, please contact our office at [phone] option 4 or send [place] a message through [place].   We are unable to tell what your co-pay for medications will be in advance as this is different depending on your insurance coverage. However, we may be able to find a substitute medication at lower cost or fill out paperwork to get insurance to cover a needed medication.   If a prior authorization is required to get your medication covered by your insurance company, please allow [place] 1-2 business days to complete this process.  Drug prices often vary depending on where the prescription is filled and some pharmacies may offer cheaper prices.  The website www.goodrx.com contains coupons for medications through different pharmacies. The prices here do not account for what the cost may be with help from insurance (it may be cheaper with your insurance), but the website can give you the price if you did not use any insurance.  - You can print the associated coupon and take it with your prescription to the pharmacy.  - You may also stop by our office during regular business hours and pick up a GoodRx coupon card.  - If you need your prescription sent electronically to a  different pharmacy, notify our office through [facility] or by phone at [phone] option 4.     Si Usted Necesita Algo Despus de Su Visita  Tambin puede enviarnos un mensaje a travs de Pharmacist, community. Por lo general respondemos a los mensajes de MyChart en el transcurso de 1 a 2 das hbiles.  Para renovar recetas, por favor pida a su farmacia que se ponga en contacto con nuestra oficina. Harland Dingwall de fax es [place] [phone].  Si tiene un asunto urgente cuando la clnica est cerrada y que no puede esperar hasta el siguiente da hbil, puede llamar/localizar a su doctor(a) al nmero que aparece a continuacin.   Por favor, tenga en cuenta que aunque hacemos todo lo posible para estar disponibles para asuntos urgentes fuera del horario de [place], no estamos disponibles las 24 horas del da, los 7 das de la [place].   Si tiene un problema urgente y no puede comunicarse con nosotros, puede optar  por buscar atencin mdica  en el consultorio de su doctor(a), en una clnica privada, en un centro de atencin urgente o en una sala de emergencias.  Si tiene Engineering geologist, por favor llame inmediatamente al 911 o vaya a la sala de emergencias.  Nmeros de bper  - Dr. Nehemiah Massed: [phone]  - Dra. Moye: [phone]  - Dra. Nicole Kindred: [phone]  En caso de inclemencias del [place], por favor llame a Johnsie Kindred principal al [phone] para una actualizacin sobre el [place] de cualquier retraso o cierre.  Consejos para la medicacin en dermatologa: Por favor, guarde las cajas en las que vienen los medicamentos de uso tpico para ayudarle a seguir las instrucciones sobre dnde y cmo usarlos. Las farmacias generalmente imprimen las instrucciones del medicamento slo en las cajas y no directamente en los tubos del [place].   Si su medicamento es muy caro, por favor, pngase en contacto con Zigmund Daniel llamando al [phone] y presione la opcin 4 o envenos un  mensaje a travs de Pharmacist, community.   No podemos decirle cul ser su copago por los medicamentos por adelantado ya que esto es diferente dependiendo de la cobertura de su seguro. Sin embargo, es posible que podamos encontrar un medicamento sustituto a Electrical engineer un formulario para que el seguro cubra el medicamento que se considera necesario.   Si se requiere una autorizacin previa para que su compaa de seguros [place] su medicamento, por favor permtanos de 1 a 2 das hbiles para completar este proceso.  Los precios de los medicamentos varan con frecuencia dependiendo del Environmental consultant de dnde se surte la receta y alguna farmacias pueden ofrecer precios ms baratos.  El sitio web www.goodrx.com tiene cupones para medicamentos de Airline pilot. Los precios aqu no tienen en cuenta lo que podra costar con la ayuda del seguro (puede ser ms barato con su seguro), pero el sitio web puede darle el precio si no utiliz Research scientist (physical sciences).  - Puede imprimir el cupn correspondiente y llevarlo con su receta a la farmacia.  - Tambin puede pasar por nuestra oficina durante el horario de atencin regular y Charity fundraiser una tarjeta de cupones de GoodRx.  - Si necesita que su receta se enve electrnicamente a una farmacia diferente, informe a nuestra oficina a travs de MyChart de [place] o por telfono llamando al [phone] y presione la opcin 4.

## 2022-06-21 NOTE — Progress Notes (Signed)
Follow-Up Visit   Subjective  Paul Choi is a 76 y.o. male who presents for the following: Total body skin exam (Hx of dysplastic nevi).  The patient presents for Total-Body Skin Exam (TBSE) for skin cancer screening and mole check.  The patient has spots, moles and lesions to be evaluated, some may be new or changing and the patient has concerns that these could be cancer.   The following portions of the chart were reviewed this encounter and updated as appropriate:   Tobacco  Allergies  Meds  Problems  Med Hx  Surg Hx  Fam Hx      Review of Systems:  No other skin or systemic complaints except as noted in HPI or Assessment and Plan.  Objective  Well appearing patient in no apparent distress; mood and affect are within normal limits.  A full examination was performed including scalp, head, eyes, ears, nose, lips, neck, chest, axillae, abdomen, back, buttocks, bilateral upper extremities, bilateral lower extremities, hands, feet, fingers, toes, fingernails, and toenails. All findings within normal limits unless otherwise noted below.  L lat thigh x 1, R helix x 1, L helix x 1 (3) hyperkeratotic scaly papules  R dorsal foot 0.2cm dark brown irregular thin pap       Assessment & Plan   Lentigines - Scattered tan macules - Due to sun exposure - Benign-appearing, observe - Recommend daily broad spectrum sunscreen SPF 30+ to sun-exposed areas, reapply every 2 hours as needed. - Call for any changes  Seborrheic Keratoses - Stuck-on, waxy, tan-brown papules and/or plaques  - Benign-appearing - Discussed benign etiology and prognosis. - Observe - Call for any changes  Melanocytic Nevi - Tan-brown and/or pink-flesh-colored symmetric macules and papules - Benign appearing on exam today - Observation - Call clinic for new or changing moles - Recommend daily use of broad spectrum spf 30+ sunscreen to sun-exposed areas.   Hemangiomas - Red papules - Discussed  benign nature - Observe - Call for any changes  Actinic Damage - Chronic condition, secondary to cumulative UV/sun exposure - diffuse scaly erythematous macules with underlying dyspigmentation - Recommend daily broad spectrum sunscreen SPF 30+ to sun-exposed areas, reapply every 2 hours as needed.  - Staying in the shade or wearing long sleeves, sun glasses (UVA+UVB protection) and wide brim hats (4-inch brim around the entire circumference of the hat) are also recommended for sun protection.  - Call for new or changing lesions.  Skin cancer screening performed today.   History of Dysplastic Nevi - No evidence of recurrence today - Recommend regular full body skin exams - Recommend daily broad spectrum sunscreen SPF 30+ to sun-exposed areas, reapply every 2 hours as needed.  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 They are chronic with expected duration over 1 year. A portion of actinic keratoses will progress to squamous cell carcinoma of the skin. It is not possible to reliably predict which spots will progress to skin cancer and so treatment is recommended to prevent development of skin cancer.  Recommend daily broad spectrum sunscreen SPF 30+ to sun-exposed areas, reapply every 2 hours as needed.  Recommend staying in the  shade or wearing long sleeves, sun glasses (UVA+UVB protection) and wide brim hats (4-inch brim around the entire circumference of the hat). Call for new or changing lesions.  Prior to procedure, discussed risks of blister formation, small wound, skin dyspigmentation, or rare scar following cryotherapy. Recommend Vaseline ointment to treated areas while  healing.   Destruction of lesion - L lat thigh x 1, R helix x 1, L helix x 1  Destruction method: cryotherapy   Informed consent: discussed and consent obtained   Lesion destroyed using liquid nitrogen: Yes   Region frozen until ice ball extended beyond lesion: Yes   Outcome: patient tolerated procedure well with no complications   Post-procedure details: wound care instructions given   Additional details:  Prior to procedure, discussed risks of blister formation, small wound, skin dyspigmentation, or rare scar following cryotherapy. Recommend Vaseline ointment to treated areas while healing.   Neoplasm of skin R dorsal foot  Epidermal / dermal shaving  Lesion diameter (cm):  0.2 Informed consent: discussed and consent obtained   Patient was prepped and draped in usual sterile fashion: area prepped with alcohol. Anesthesia: the lesion was anesthetized in a standard fashion   Anesthetic:  1% lidocaine w/ epinephrine 1-100,000 buffered w/ 8.4% NaHCO3 Instrument used: flexible razor blade   Hemostasis achieved with: pressure, aluminum chloride and electrodesiccation   Outcome: patient tolerated procedure well   Post-procedure details: wound care instructions given   Post-procedure details comment:  Ointment and small bandage applied  Specimen 1 - Surgical pathology Differential Diagnosis: D48.5 Nevus vs Dysplastic Nevus  Check Margins: yes 0.2cm dark brown irregular thin pap   Return in about 1 year (around 06/22/2023) for TBSE, Hx of Dysplastic nevi, Hx of AKs, 72mf/u AKs and recheck moles on back.  I, SOthelia Pulling RMA, am acting as scribe for VForest Gleason MD .  Documentation: I have reviewed the above documentation for accuracy and completeness, and I agree with the above.  VForest Gleason MD

## 2022-06-25 ENCOUNTER — Encounter: Payer: Self-pay | Admitting: Dermatology

## 2022-06-28 ENCOUNTER — Telehealth: Payer: Self-pay

## 2022-06-28 NOTE — Telephone Encounter (Signed)
Discussed pathology results. Patient voiced understanding. Will recheck at next visit.

## 2022-06-28 NOTE — Telephone Encounter (Signed)
-----   Message from [place], MD sent at 06/28/2022 10:36 AM EST ----- Skin , right dorsal foot DYSPLASTIC JUNCTIONAL LENTIGINOUS NEVUS WITH MODERATE ATYPIA, IRRITATED, LIMITED MARGINS FREE --> recheck for color coming back at follow-up  This is a MODERATELY ATYPICAL MOLE. On the spectrum from normal mole to melanoma skin cancer, this is in between the two. - We need to recheck this area sometime in the next 6 months to be sure there is no evidence of the atypical mole coming back. If there is any color coming back, we would recommend repeating the biopsy to be sure the cells look normal.  - People who have a history of atypical moles do have a slightly increased risk of developing melanoma somewhere on the body, so a yearly full body skin exam by a dermatologist is recommended.  - Monthly self skin checks and daily sun protection are also recommended.  - Please call if you notice a dark spot coming back where this biopsy was taken.  - Please also call if you notice any new or changing spots anywhere else on the body before your follow-up visit.     MAs please call. Thank you!

## 2022-08-06 ENCOUNTER — Other Ambulatory Visit: Payer: Self-pay | Admitting: Internal Medicine

## 2022-08-22 ENCOUNTER — Ambulatory Visit (INDEPENDENT_AMBULATORY_CARE_PROVIDER_SITE_OTHER): Payer: Medicare Other | Admitting: Dermatology

## 2022-08-22 DIAGNOSIS — L578 Other skin changes due to chronic exposure to nonionizing radiation: Secondary | ICD-10-CM

## 2022-08-22 DIAGNOSIS — Z86018 Personal history of other benign neoplasm: Secondary | ICD-10-CM | POA: Diagnosis not present

## 2022-08-22 DIAGNOSIS — Z872 Personal history of diseases of the skin and subcutaneous tissue: Secondary | ICD-10-CM

## 2022-08-22 NOTE — Progress Notes (Signed)
   Follow-Up Visit   Subjective  Paul Choi is a 77 y.o. male who presents for the following: Follow-up (Patient here today for AK follow up and recheck nevi at back. Patient does have hx of dysplastic nevi. Hypertrophic AK's treated at L lat thigh x 1, R helix x 1, L helix x 1 with LN2 at last visit. ).  The patient has spots, moles and lesions to be evaluated, some may be new or changing and the patient has concerns that these could be cancer.   The following portions of the chart were reviewed this encounter and updated as appropriate:   Tobacco  Allergies  Meds  Problems  Med Hx  Surg Hx  Fam Hx      Review of Systems:  No other skin or systemic complaints except as noted in HPI or Assessment and Plan.  Objective  Well appearing patient in no apparent distress; mood and affect are within normal limits.  A focused examination was performed including face, feet, leg. Relevant physical exam findings are noted in the Assessment and Plan.    Assessment & Plan  History of actinic keratoses  Actinic skin damage   History of Dysplastic Nevi - No evidence of recurrence today at right dorsal foot - Recommend regular full body skin exams - Recommend daily broad spectrum sunscreen SPF 30+ to sun-exposed areas, reapply every 2 hours as needed.  - Call if any new or changing lesions are noted between office visits  History of PreCancerous Actinic Keratosis  - site(s) of PreCancerous Actinic Keratosis clear today. - these may recur and new lesions may form requiring treatment to prevent transformation into skin cancer - observe for new or changing spots and contact [place] for appointment if occur - photoprotection with sun protective clothing; sunglasses and broad spectrum sunscreen with SPF of at least 30 + and frequent self skin exams recommended - yearly exams by a dermatologist recommended for persons with history of PreCancerous Actinic Keratoses  Actinic  Damage - chronic, secondary to cumulative UV radiation exposure/sun exposure over time - diffuse scaly erythematous macules with underlying dyspigmentation - Recommend daily broad spectrum sunscreen SPF 30+ to sun-exposed areas, reapply every 2 hours as needed.  - Recommend staying in the shade or wearing long sleeves, sun glasses (UVA+UVB protection) and wide brim hats (4-inch brim around the entire circumference of the hat). - Call for new or changing lesions.  Return for TBSE, as scheduled.  Graciella Belton, RMA, am acting as scribe for Forest Gleason, MD .  Documentation: I have reviewed the above documentation for accuracy and completeness, and I agree with the above.  Forest Gleason, MD

## 2022-08-22 NOTE — Patient Instructions (Signed)
Due to recent changes in healthcare laws, you may see results of your pathology and/or laboratory studies on MyChart before the doctors have had a chance to review them. We understand that in some cases there may be results that are confusing or concerning to you. Please understand that not all results are received at the same time and often the doctors may need to interpret multiple results in order to provide you with the best plan of care or course of treatment. Therefore, we ask that you please give us 2 business days to thoroughly review all your results before contacting the office for clarification. Should we see a critical lab result, you will be contacted sooner.   If You Need Anything After Your Visit  If you have any questions or concerns for your doctor, please call our main line at [phone] and press option 4 to reach your doctor's medical assistant. If no one answers, please leave a voicemail as directed and we will return your call as soon as possible. Messages left after 4 pm will be answered the following business day.   You may also send us a message via MyChart. We typically respond to MyChart messages within 1-2 business days.  For prescription refills, please ask your pharmacy to contact our office. Our fax number is [phone].  If you have an urgent issue when the clinic is closed that cannot wait until the next business day, you can page your doctor at the number below.    Please note that while we do our best to be available for urgent issues outside of office hours, we are not available 24/7.   If you have an urgent issue and are unable to reach us, you may choose to seek medical care at your doctor's office, retail clinic, urgent care center, or emergency room.  If you have a medical emergency, please immediately call 911 or go to the emergency department.  Pager Numbers  - Dr. Kowalski: [phone]  - Dr. Moye: [phone]  - Dr. Stewart:  [phone]  In the event of inclement weather, please call our main line at [phone] for an update on the status of any delays or closures.  Dermatology Medication Tips: Please keep the boxes that topical medications come in in order to help keep track of the instructions about where and how to use these. Pharmacies typically print the medication instructions only on the boxes and not directly on the medication tubes.   If your medication is too expensive, please contact our office at [phone] option 4 or send us a message through MyChart.   We are unable to tell what your co-pay for medications will be in advance as this is different depending on your insurance coverage. However, we may be able to find a substitute medication at lower cost or fill out paperwork to get insurance to cover a needed medication.   If a prior authorization is required to get your medication covered by your insurance company, please allow us 1-2 business days to complete this process.  Drug prices often vary depending on where the prescription is filled and some pharmacies may offer cheaper prices.  The website www.goodrx.com contains coupons for medications through different pharmacies. The prices here do not account for what the cost may be with help from insurance (it may be cheaper with your insurance), but the website can give you the price if you did not use any insurance.  - You can print the associated coupon and take it with   your prescription to the pharmacy.  - You may also stop by our office during regular business hours and pick up a GoodRx coupon card.  - If you need your prescription sent electronically to a different pharmacy, notify our office through [place] MyChart or by phone at [phone] option 4.     Si Usted Necesita Algo Despus de Su Visita  Tambin puede enviarnos un mensaje a travs de MyChart. Por lo general respondemos a los mensajes de MyChart en el transcurso de 1 a 2  das hbiles.  Para renovar recetas, por favor pida a su farmacia que se ponga en contacto con nuestra oficina. Nuestro nmero de fax es el [phone].  Si tiene un asunto urgente cuando la clnica est cerrada y que no puede esperar hasta el siguiente da hbil, puede llamar/localizar a su doctor(a) al nmero que aparece a continuacin.   Por favor, tenga en cuenta que aunque hacemos todo lo posible para estar disponibles para asuntos urgentes fuera del horario de oficina, no estamos disponibles las 24 horas del da, los 7 das de la semana.   Si tiene un problema urgente y no puede comunicarse con nosotros, puede optar por buscar atencin mdica  en el consultorio de su doctor(a), en una clnica privada, en un centro de atencin urgente o en una sala de emergencias.  Si tiene una emergencia mdica, por favor llame inmediatamente al 911 o vaya a la sala de emergencias.  Nmeros de bper  - Dr. Kowalski: [phone]  - Dra. Moye: [phone]  - Dra. Stewart: [phone]  En caso de inclemencias del tiempo, por favor llame a nuestra lnea principal al [phone] para una actualizacin sobre el estado de cualquier retraso o cierre.  Consejos para la medicacin en dermatologa: Por favor, guarde las cajas en las que vienen los medicamentos de uso tpico para ayudarle a seguir las instrucciones sobre dnde y cmo usarlos. Las farmacias generalmente imprimen las instrucciones del medicamento slo en las cajas y no directamente en los tubos del medicamento.   Si su medicamento es muy caro, por favor, pngase en contacto con nuestra oficina llamando al [phone] y presione la opcin 4 o envenos un mensaje a travs de MyChart.   No podemos decirle cul ser su copago por los medicamentos por adelantado ya que esto es diferente dependiendo de la cobertura de su seguro. Sin embargo, es posible que podamos encontrar un medicamento sustituto a menor costo o llenar un formulario para que el  seguro cubra el medicamento que se considera necesario.   Si se requiere una autorizacin previa para que su compaa de seguros cubra su medicamento, por favor permtanos de 1 a 2 das hbiles para completar este proceso.  Los precios de los medicamentos varan con frecuencia dependiendo del lugar de dnde se surte la receta y alguna farmacias pueden ofrecer precios ms baratos.  El sitio web www.goodrx.com tiene cupones para medicamentos de diferentes farmacias. Los precios aqu no tienen en cuenta lo que podra costar con la ayuda del seguro (puede ser ms barato con su seguro), pero el sitio web puede darle el precio si no utiliz ningn seguro.  - Puede imprimir el cupn correspondiente y llevarlo con su receta a la farmacia.  - Tambin puede pasar por nuestra oficina durante el horario de atencin regular y recoger una tarjeta de cupones de GoodRx.  - Si necesita que su receta se enve electrnicamente a una farmacia diferente, informe a nuestra oficina a travs de MyChart de    o por telfono llamando al [phone] y presione la opcin 4.  

## 2022-08-29 ENCOUNTER — Encounter: Payer: Self-pay | Admitting: Dermatology

## 2022-10-22 ENCOUNTER — Other Ambulatory Visit: Payer: Self-pay | Admitting: Internal Medicine

## 2022-11-05 ENCOUNTER — Telehealth: Payer: Self-pay | Admitting: Internal Medicine

## 2022-11-05 MED ORDER — ATORVASTATIN CALCIUM 40 MG PO TABS: 40.0000 mg | ORAL_TABLET | Freq: Every day | ORAL | 0 refills | Status: DC

## 2022-11-05 NOTE — Telephone Encounter (Signed)
*  STAT* If patient is at the pharmacy, call can be transferred to refill team.   1. Which medications need to be refilled? (please list name of each medication and dose if known) atorvastatin (LIPITOR) 40 MG tablet   2. Which pharmacy/location (including street and city if local pharmacy) is medication to be sent to?   [facility] [place], [place]    3. Do they need a 30 day or 90 day supply? [place]

## 2022-11-05 NOTE — Telephone Encounter (Signed)
Requested Prescriptions   Signed Prescriptions Disp Refills   atorvastatin (LIPITOR) 40 MG tablet 30 tablet 0    Sig: Take 1 tablet (40 mg total) by mouth daily. Please keep upcoming appointment for further refills. Thank you.    Authorizing Provider: Deboraha Sprang    Ordering User: Raelene Bott, Tamatha Gadbois L

## 2022-11-29 ENCOUNTER — Encounter: Payer: Self-pay | Admitting: Internal Medicine

## 2022-11-29 ENCOUNTER — Ambulatory Visit: Payer: Medicare Other | Attending: Internal Medicine | Admitting: Internal Medicine

## 2022-11-29 VITALS — BP 138/78 | HR 65 | Ht 72.0 in | Wt 190.2 lb

## 2022-11-29 DIAGNOSIS — I2510 Atherosclerotic heart disease of native coronary artery without angina pectoris: Secondary | ICD-10-CM | POA: Insufficient documentation

## 2022-11-29 DIAGNOSIS — I493 Ventricular premature depolarization: Secondary | ICD-10-CM | POA: Diagnosis present

## 2022-11-29 DIAGNOSIS — I7789 Other specified disorders of arteries and arterioles: Secondary | ICD-10-CM | POA: Diagnosis present

## 2022-11-29 DIAGNOSIS — R002 Palpitations: Secondary | ICD-10-CM | POA: Insufficient documentation

## 2022-11-29 DIAGNOSIS — Z79899 Other long term (current) drug therapy: Secondary | ICD-10-CM | POA: Insufficient documentation

## 2022-11-29 MED ORDER — ATORVASTATIN CALCIUM 80 MG PO TABS: 80.0000 mg | ORAL_TABLET | Freq: Every day | ORAL | 3 refills | Status: DC

## 2022-11-29 NOTE — Progress Notes (Signed)
Patient Care Team: Lynnea Ferrier, MD as PCP - General (Internal Medicine) Lemar Livings Merrily Pew, MD (General Surgery)   HPI  Paul Choi is a 77 y.o. male seen in followup for CAD, PVCs and HTN   The patient denies chest pain, shortness of breath, nocturnal dyspne, orthopnea .  There have been no palpitations, lightheadedness or syncope.  Complains of peripheral edema.  His diet is replete with intrinsic sodium.  He does not add salt..       Given the absence of symptoms, he was referred for preeval with Dr [facility] who after exhaustive evaluation,  LHC 9/17 >>   1. Critical stenosis of the proximal RCA treated successfully with coronary stenting using a 3.5 x 16 mm Promus drug-eluting stent 2. Chronic occlusion of the mid circumflex with the first OM branch collateralized from the LAD and right coronary artery 3. Moderate stenosis of a large ramus intermedius branch 4. Diffuse nonobstructive stenosis of the LAD     DATE TEST EF   5/18 Echo   45-+50 % Ao 4.0/4.1No stenosis  6/19 Echo   55-60 % Ao 41/42 No stenosis  9/20 Echo  50-55% Ao 110mm    Date Cr K Hgb LDL  9/17 1.55 4.7    3/19 1.4 4.9 14.1 77  3/20 1.4 4.3 14.6 73  9/21 1.6 4.7 13.4 76 (4/21)  7/22 1.3 4.4  77  1/24 1.5 4.7 13.2 65    Failed to tolerate rosuvastatin  Back on atorvastatin LDL remains above target  The patient denies chest pain, shortness of breath, nocturnal dyspnea, orthopnea or peripheral edema.  There have been no palpitations, lightheadedness or syncope.         Past Medical History:  Diagnosis Date   Actinic keratosis    Arteriosclerotic cardiovascular disease    Arthritis 1997   Coronary artery disease    Cardiac catheterization in August 2003 with occluded left circumflex. 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 Mild atypia, irritated, limited margins free.   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 Severe atypia, close to lateral margin. Excised 10/23/2017, margins free.   Dysplastic nevus 10/09/2018   Right lower abdomen. Moderate atypia, limited margins free.    Dysplastic nevus 04/02/2019   Low back mid line. Moderate atypia, irritated, limited magins free.   Dysplastic nevus 06/21/2022   Right dorsal foot. Moderate atypia, margins free.   ED (erectile dysfunction)    Gout    Gout    Heart disease    History of dysplastic nevus 05/18/2020   left lateral inferior chest, moderate   Hyperlipidemia    Hypertension 1982   Insomnia    Personal history of colonic polyps    Renal insufficiency     Past Surgical History:  Procedure Laterality Date   CARDIAC CATHETERIZATION  2006   CARDIAC CATHETERIZATION N/A 05/04/2016   Procedure: Left Heart Cath and Coronary Angiography;  Surgeon: Tonny Bollman, MD;  Location[facility] INVASIVE CV LAB;  Service: Cardiovascular;  Laterality: N/A;   CARDIAC CATHETERIZATION N/A 05/04/2016   Procedure: Coronary Stent Intervention;  Surgeon: Tonny Bollman, MD;  Location: [facility] INVASIVE CV LAB;  Service: Cardiovascular;  Laterality: N/A;   COLONOSCOPY  2007   COLONOSCOPY WITH PROPOFOL N/A 06/26/2017  Procedure: COLONOSCOPY WITH PROPOFOL;  Surgeon: Earline Mayotte, MD;  Location: [facility] ENDOSCOPY;  Service: Endoscopy;  Laterality: N/A;   COLONOSCOPY WITH PROPOFOL N/A 09/02/2020   Procedure: COLONOSCOPY WITH PROPOFOL;  Surgeon: Earline Mayotte, MD;  Location: [facility] ENDOSCOPY;  Service: Endoscopy;  Laterality: N/A;  COVID POSITIVE AT [facility] ON 08/22/2020 - ON CHART   CORONARY STENT PLACEMENT  05/04/2016   proximal RCA treated successfully with coronary stenting using a 3.5 x 16 mm Promus drug-eluting stent   CORONARY STENT PLACEMENT  05/04/2016   Mid LAD lesion, 30 %stenosed.   HERNIA REPAIR Left 08/23/2006   Direct inguinal  hernia repair with a large ultra Pro mesh.   KNEE SURGERY  1988    Current Outpatient Medications  Medication Sig Dispense Refill   allopurinol (ZYLOPRIM) 100 MG tablet Take 200 mg by mouth every evening.      amLODipine (NORVASC) 5 MG tablet Take 5 mg by mouth every evening.      aspirin EC 81 MG tablet Take 81 mg by mouth daily.     atorvastatin (LIPITOR) 40 MG tablet Take 1 tablet (40 mg total) by mouth daily. Please keep upcoming appointment for further refills. Thank you. 30 tablet 0   cholecalciferol (VITAMIN D) 1000 units tablet Take 1,000 Units by mouth every morning.      colchicine 0.6 MG tablet Take 0.6 mg by mouth daily as needed (for gout flare ups.).      docusate sodium (COLACE) 100 MG capsule Take 200-300 mg by mouth daily as needed for mild constipation.     losartan (COZAAR) 100 MG tablet Take 1 tablet (100 mg total) by mouth every evening.     No current facility-administered medications for this visit.    Allergies  Allergen Reactions   Ace Inhibitors Cough   Contrast Media [Iodinated Contrast Media] Other (See Comments)    Cough and difficulty breathing    Lovastatin Other (See Comments)    Myalgia at 80-mg dose   Other Nausea Only, Hives and Cough    Contrast media  Had difficulty breathing   Shellfish Allergy Hives   Trazodone Other (See Comments)    "felt like zombie"      Review of Systems negative except from HPI and PMH  Physical Exam BP (!) 164/80   Pulse 65   Ht 6' (1.829 m)   Wt 190 lb 3.2 oz (86.3 kg)   SpO2 98%   BMI 25.80 kg/m  Well developed and nourished in no acute distress HENT normal Neck supple with JVP-  flat  Clear Regular rate and rhythm, no murmurs or gallops Abd-soft with active BS No Clubbing cyanosis edema Skin-warm and dry A & Oriented  Grossly normal sensory and motor function  ECG sinus at 65 Interval 21/10/39  Assessment/plan  Palpitations-PVC   Coronary artery disease with an occluded circumflex,  left-right collaterals 2003  High-grade RCA stenosis status post DES   Cardiomyopathy  Resolved  >>>>>>Mitral regurgitation-moderate>>mild  Bicuspid aortic valve/mild aortic root dilitation   Hypertension  Blood pressure is reasonably controlled especially with his numbers at home.  Will continue him on Cozaar and amlodipine.  No symptoms of ischemia.  Continue him on aspirin, his LDL is at not at target, we will increase from 40-80 and then we will check it again in 3 months.  PVCs are quiescient.  Repeat his echocardiogram to look at aortic root

## 2022-11-29 NOTE — Patient Instructions (Signed)
Medication Instructions Your physician has recommended you make the following change in your medication:   **  Stop Atorvastatin 40mg    ** Begin Atorvastatin 80mg  - 1 tablet by mouth daily  *If you need a refill on your cardiac medications before your next appointment, please call your pharmacy*   Lab Work: Direct LDL in 3 months  If you have labs (blood work) drawn today and your tests are completely normal, you will receive your results only by: MyChart Message (if you have MyChart) OR A paper copy in the mail If you have any lab test that is abnormal or we need to change your treatment, we will call you to review the results.   Testing/Procedures: Your physician has requested that you have an echocardiogram. Echocardiography is a painless test that uses sound waves to create images of your heart. It provides your doctor with information about the size and shape of your heart and how well your heart's chambers and valves are working. This procedure takes approximately one hour. There are no restrictions for this procedure. Please do NOT wear cologne, perfume, aftershave, or lotions (deodorant is allowed). Please arrive 15 minutes prior to your appointment time.    Follow-Up: At [facility], you and your health needs are our priority.  As part of our continuing mission to provide you with exceptional heart care, we have created designated Provider Care Teams.  These Care Teams include your primary Cardiologist (physician) and Advanced Practice Providers (APPs -  Physician Assistants and Nurse Practitioners) who all work together to provide you with the care you need, when you need it.  We recommend signing up for the patient portal called "MyChart".  Sign up information is provided on this After Visit Summary.  MyChart is used to connect with patients for Virtual Visits (Telemedicine).  Patients are able to view lab/test results, encounter notes, upcoming appointments, etc.   Non-urgent messages can be sent to your provider as well.   To learn more about what you can do with MyChart, go to [email or web address].    Your next appointment:   12 months with Dr Graciela Husbands

## 2022-12-21 ENCOUNTER — Other Ambulatory Visit: Payer: Self-pay | Admitting: Internal Medicine

## 2022-12-21 DIAGNOSIS — Z79899 Other long term (current) drug therapy: Secondary | ICD-10-CM

## 2022-12-21 DIAGNOSIS — I493 Ventricular premature depolarization: Secondary | ICD-10-CM

## 2022-12-21 DIAGNOSIS — I7789 Other specified disorders of arteries and arterioles: Secondary | ICD-10-CM

## 2022-12-21 DIAGNOSIS — R002 Palpitations: Secondary | ICD-10-CM

## 2022-12-21 DIAGNOSIS — I2510 Atherosclerotic heart disease of native coronary artery without angina pectoris: Secondary | ICD-10-CM

## 2023-01-02 ENCOUNTER — Ambulatory Visit: Payer: Medicare Other | Attending: Internal Medicine

## 2023-01-02 DIAGNOSIS — I2510 Atherosclerotic heart disease of native coronary artery without angina pectoris: Secondary | ICD-10-CM | POA: Diagnosis present

## 2023-01-02 DIAGNOSIS — I340 Nonrheumatic mitral (valve) insufficiency: Secondary | ICD-10-CM | POA: Insufficient documentation

## 2023-01-02 DIAGNOSIS — I493 Ventricular premature depolarization: Secondary | ICD-10-CM | POA: Insufficient documentation

## 2023-01-02 DIAGNOSIS — I7789 Other specified disorders of arteries and arterioles: Secondary | ICD-10-CM | POA: Insufficient documentation

## 2023-01-02 LAB — ECHOCARDIOGRAM COMPLETE
AR max vel: 4.39 cm2
AV Area VTI: 4.77 cm2
AV Area mean vel: 4.21 cm2
AV Mean grad: 2 mmHg
AV Peak grad: 4.2 mmHg
Ao pk vel: 1.02 m/s
Area-P 1/2: 3.85 cm2
Calc EF: 51.5 %
S' Lateral: 4 cm
Single Plane A2C EF: 48.6 %
Single Plane A4C EF: 51.6 %

## 2023-02-10 ENCOUNTER — Encounter: Payer: Self-pay | Admitting: Internal Medicine

## 2023-03-18 ENCOUNTER — Encounter: Payer: Self-pay | Admitting: Internal Medicine

## 2023-07-01 ENCOUNTER — Encounter: Payer: Medicare Other | Admitting: Dermatology

## 2023-07-04 ENCOUNTER — Encounter: Payer: Medicare Other | Admitting: Dermatology

## 2023-07-04 ENCOUNTER — Ambulatory Visit: Payer: Medicare Other | Admitting: Dermatology

## 2023-07-04 ENCOUNTER — Encounter: Payer: Self-pay | Admitting: Dermatology

## 2023-07-04 DIAGNOSIS — L578 Other skin changes due to chronic exposure to nonionizing radiation: Secondary | ICD-10-CM

## 2023-07-04 DIAGNOSIS — D1801 Hemangioma of skin and subcutaneous tissue: Secondary | ICD-10-CM

## 2023-07-04 DIAGNOSIS — Z1283 Encounter for screening for malignant neoplasm of skin: Secondary | ICD-10-CM

## 2023-07-04 DIAGNOSIS — W908XXA Exposure to other nonionizing radiation, initial encounter: Secondary | ICD-10-CM | POA: Diagnosis not present

## 2023-07-04 DIAGNOSIS — L814 Other melanin hyperpigmentation: Secondary | ICD-10-CM

## 2023-07-04 DIAGNOSIS — D239 Other benign neoplasm of skin, unspecified: Secondary | ICD-10-CM

## 2023-07-04 DIAGNOSIS — Z85828 Personal history of other malignant neoplasm of skin: Secondary | ICD-10-CM

## 2023-07-04 DIAGNOSIS — L570 Actinic keratosis: Secondary | ICD-10-CM

## 2023-07-04 DIAGNOSIS — L821 Other seborrheic keratosis: Secondary | ICD-10-CM

## 2023-07-04 DIAGNOSIS — D229 Melanocytic nevi, unspecified: Secondary | ICD-10-CM

## 2023-07-04 DIAGNOSIS — L720 Epidermal cyst: Secondary | ICD-10-CM

## 2023-07-04 DIAGNOSIS — Z86018 Personal history of other benign neoplasm: Secondary | ICD-10-CM

## 2023-07-04 NOTE — Patient Instructions (Addendum)
Recommend daily broad spectrum sunscreen SPF 30+ to sun-exposed areas, reapply every 2 hours as needed. Call for new or changing lesions.  Staying in the shade or wearing long sleeves, sun glasses (UVA+UVB protection) and wide brim hats (4-inch brim around the entire circumference of the hat) are also recommended for sun protection.      Cryotherapy Aftercare  Wash gently with soap and water everyday.   Apply Vaseline and Band-Aid daily until healed.     Due to recent changes in healthcare laws, you may see results of your pathology and/or laboratory studies on MyChart before the doctors have had a chance to review them. We understand that in some cases there may be results that are confusing or concerning to you. Please understand that not all results are received at the same time and often the doctors may need to interpret multiple results in order to provide you with the best plan of care or course of treatment. Therefore, we ask that you please give [place] 2 business days to thoroughly review all your results before contacting the office for clarification. Should we see a critical lab result, you will be contacted sooner.   If You Need Anything After Your Visit  If you have any questions or concerns for your doctor, please call our main line at [phone] and press option 4 to reach your doctor's medical assistant. If no one answers, please leave a voicemail as directed and we will return your call as soon as possible. Messages left after 4 pm will be answered the following business day.   You may also send [place] a message via MyChart. We typically respond to MyChart messages within 1-2 business days.  For prescription refills, please ask your pharmacy to contact our office. Our fax number is [phone].  If you have an urgent issue when the clinic is closed that cannot wait until the next business day, you can page your doctor at the number below.    Please note that while we do our best  to be available for urgent issues outside of office hours, we are not available 24/7.   If you have an urgent issue and are unable to reach [place], you may choose to seek medical care at your doctor's office, retail clinic, urgent care center, or emergency room.  If you have a medical emergency, please immediately call 911 or go to the emergency department.  Pager Numbers  - Dr. Gwen Pounds: [phone]  - Dr. Roseanne Reno: [phone]  - Dr. Katrinka Blazing: [phone]   In the event of inclement weather, please call our main line at [phone] for an update on the status of any delays or closures.  Dermatology Medication Tips: Please keep the boxes that topical medications come in in order to help keep track of the instructions about where and how to use these. Pharmacies typically print the medication instructions only on the boxes and not directly on the medication tubes.   If your medication is too expensive, please contact our office at [phone] option 4 or send [place] a message through MyChart.   We are unable to tell what your co-pay for medications will be in advance as this is different depending on your insurance coverage. However, we may be able to find a substitute medication at lower cost or fill out paperwork to get insurance to cover a needed medication.   If a prior authorization is required to get your medication covered by your insurance company, please allow [place] 1-2 business days to  complete this process.  Drug prices often vary depending on where the prescription is filled and some pharmacies may offer cheaper prices.  The website www.goodrx.com contains coupons for medications through different pharmacies. The prices here do not account for what the cost may be with help from insurance (it may be cheaper with your insurance), but the website can give you the price if you did not use any insurance.  - You can print the associated coupon and take it with your prescription to the  pharmacy.  - You may also stop by our office during regular business hours and pick up a GoodRx coupon card.  - If you need your prescription sent electronically to a different pharmacy, notify our office through [facility] or by phone at [phone] option 4.     Si Usted Necesita Algo Despus de Su Visita  Tambin puede enviarnos un mensaje a travs de Clinical cytogeneticist. Por lo general respondemos a los mensajes de MyChart en el transcurso de 1 a 2 das hbiles.  Para renovar recetas, por favor pida a su farmacia que se ponga en contacto con nuestra oficina. Annie Sable de fax es [place] [phone].  Si tiene un asunto urgente cuando la clnica est cerrada y que no puede esperar hasta el siguiente da hbil, puede llamar/localizar a su doctor(a) al nmero que aparece a continuacin.   Por favor, tenga en cuenta que aunque hacemos todo lo posible para estar disponibles para asuntos urgentes fuera del horario de [place], no estamos disponibles las 24 horas del da, los 7 [address]  [address] de la [place].   Si tiene un problema urgente y no puede comunicarse con nosotros, puede optar por buscar atencin mdica  en el consultorio de su doctor(a), en una clnica privada, en un centro de atencin urgente o en una sala de emergencias.  Si tiene Engineer, drilling, por favor llame inmediatamente al 911 o vaya a la sala de emergencias.  Nmeros de bper  - Dr. Gwen Pounds: [phone]  - Dra. Roseanne Reno: [phone]  - Dr. Katrinka Blazing: [phone]   En caso de inclemencias del tiempo, por favor llame a Lacy Duverney principal al [phone] para una actualizacin sobre el [place] de cualquier retraso o cierre.  Consejos para la medicacin en dermatologa: Por favor, guarde las cajas en las que vienen los medicamentos de uso tpico para ayudarle a seguir las instrucciones sobre dnde y cmo usarlos. Las farmacias generalmente imprimen las instrucciones del medicamento slo en las cajas y no directamente en los  tubos del [place].   Si su medicamento es muy caro, por favor, pngase en contacto con Rolm Gala llamando al [phone] y presione la opcin 4 o envenos un mensaje a travs de Clinical cytogeneticist.   No podemos decirle cul ser su copago por los medicamentos por adelantado ya que esto es diferente dependiendo de la cobertura de su seguro. Sin embargo, es posible que podamos encontrar un medicamento sustituto a Audiological scientist un formulario para que el seguro cubra el medicamento que se considera necesario.   Si se requiere una autorizacin previa para que su compaa de seguros [place] su medicamento, por favor permtanos de 1 a 2 das hbiles para completar [address]  Los precios de los medicamentos varan con frecuencia dependiendo del Environmental consultant de dnde se surte la receta y alguna farmacias pueden ofrecer precios ms baratos.  El sitio web www.goodrx.com tiene cupones para medicamentos de Health and safety inspector. Los precios aqu no tienen en cuenta lo que podra costar con la [employer]  del seguro (puede ser ms barato con su seguro), pero el sitio web puede darle el precio si no Visual merchandiser.  - Puede imprimir el cupn correspondiente y llevarlo con su receta a la farmacia.  - Tambin puede pasar por nuestra oficina durante el horario de atencin regular y Education officer, museum una tarjeta de cupones de GoodRx.  - Si necesita que su receta se enve electrnicamente a una farmacia diferente, informe a nuestra oficina a travs de MyChart de [place] o por telfono llamando al [phone] y presione la opcin 4.

## 2023-07-04 NOTE — Progress Notes (Signed)
Follow-Up Visit   Subjective  Paul Choi is a 77 y.o. male who presents for the following: Skin Cancer Screening and Full Body Skin Exam hx of Dysplastic Nevi, Aks, nodule L back has had for yrs  The patient presents for Total-Body Skin Exam (TBSE) for skin cancer screening and mole check. The patient has spots, moles and lesions to be evaluated, some may be new or changing and the patient may have concern these could be cancer.    The following portions of the chart were reviewed this encounter and updated as appropriate: medications, allergies, medical history  Review of Systems:  No other skin or systemic complaints except as noted in HPI or Assessment and Plan.  Objective  Well appearing patient in no apparent distress; mood and affect are within normal limits.  A full examination was performed including scalp, head, eyes, ears, nose, lips, neck, chest, axillae, abdomen, back, buttocks, bilateral upper extremities, bilateral lower extremities, hands, feet, fingers, toes, fingernails, and toenails. All findings within normal limits unless otherwise noted below.   Relevant physical exam findings are noted in the Assessment and Plan.  frontal scalp x 1, R helix x 1 (2) Pink scaly macules    Assessment & Plan   SKIN CANCER SCREENING PERFORMED TODAY.  ACTINIC DAMAGE - Chronic condition, secondary to cumulative UV/sun exposure - diffuse scaly erythematous macules with underlying dyspigmentation - Recommend daily broad spectrum sunscreen SPF 30+ to sun-exposed areas, reapply every 2 hours as needed.  - Staying in the shade or wearing long sleeves, sun glasses (UVA+UVB protection) and wide brim hats (4-inch brim around the entire circumference of the hat) are also recommended for sun protection.  - Call for new or changing lesions.  LENTIGINES, SEBORRHEIC KERATOSES, HEMANGIOMAS - Benign normal skin lesions - Benign-appearing - Call for any changes  MELANOCYTIC NEVI -  Tan-brown and/or pink-flesh-colored symmetric macules and papules - Benign appearing on exam today - Observation - Call clinic for new or changing moles - Recommend daily use of broad spectrum spf 30+ sunscreen to sun-exposed areas.   HISTORY OF BASAL CELL CARCINOMA OF THE SKIN - No evidence of recurrence today - Recommend regular full body skin exams - Recommend daily broad spectrum sunscreen SPF 30+ to sun-exposed areas, reapply every 2 hours as needed.  - Call if any new or changing lesions are noted between office visits  - L flank, R lower chest, R lower abdomen, low back midline, R dorsal foot, L lat inferior chest   DERMATOFIBROMA Exam: Firm pink/brown papulenodule with dimple sign. Treatment Plan: A dermatofibroma is a benign growth possibly related to trauma, such as an insect bite, cut from shaving, or inflamed acne-type bump.  Treatment options to remove include shave or excision with resulting scar and risk of recurrence.  Since benign-appearing and not bothersome, will observe for now.   EPIDERMAL INCLUSION CYST Exam: Subcutaneous nodule at L upper back  Benign-appearing. Exam most consistent with an epidermal inclusion cyst. Discussed that a cyst is a benign growth that can grow over time and sometimes get irritated or inflamed. Recommend observation if it is not bothersome. Discussed option of surgical excision to remove it if it is growing, symptomatic, or other changes noted. Please call for new or changing lesions so they can be evaluated.   AK (actinic keratosis) (2) frontal scalp x 1, R helix x 1  Actinic keratoses are precancerous spots that appear secondary to cumulative UV radiation exposure/sun exposure over time. They are chronic  with expected duration over 1 year. A portion of actinic keratoses will progress to squamous cell carcinoma of the skin. It is not possible to reliably predict which spots will progress to skin cancer and so treatment is recommended to  prevent development of skin cancer.  Recommend daily broad spectrum sunscreen SPF 30+ to sun-exposed areas, reapply every 2 hours as needed.  Recommend staying in the shade or wearing long sleeves, sun glasses (UVA+UVB protection) and wide brim hats (4-inch brim around the entire circumference of the hat). Call for new or changing lesions.  Destruction of lesion - frontal scalp x 1, R helix x 1 (2) Complexity: simple   Destruction method: cryotherapy   Informed consent: discussed and consent obtained   Timeout:  patient name, date of birth, surgical site, and procedure verified Lesion destroyed using liquid nitrogen: Yes   Region frozen until ice ball extended beyond lesion: Yes   Cryo cycles: 1 or 2. Outcome: patient tolerated procedure well with no complications   Post-procedure details: wound care instructions given    Multiple benign nevi  Lentigines  Actinic elastosis  Seborrheic keratoses  Cherry angioma  Dermatofibroma  EIC (epidermal inclusion cyst)   Return in about 1 year (around 07/03/2024) for TBSE, Hx of Dysplastic nevi, Hx of AKs.  I, Ardis Rowan, RMA, am acting as scribe for Elie Goody, MD .   Documentation: I have reviewed the above documentation for accuracy and completeness, and I agree with the above.  Elie Goody, MD

## 2023-10-08 ENCOUNTER — Encounter: Payer: Self-pay | Admitting: Gastroenterology

## 2023-10-08 ENCOUNTER — Ambulatory Visit (INDEPENDENT_AMBULATORY_CARE_PROVIDER_SITE_OTHER): Payer: Medicare Other | Admitting: Gastroenterology

## 2023-10-08 VITALS — BP 148/61 | HR 89 | Temp 97.5°F | Ht 70.5 in | Wt 191.0 lb

## 2023-10-08 DIAGNOSIS — Z860101 Personal history of adenomatous and serrated colon polyps: Secondary | ICD-10-CM | POA: Diagnosis not present

## 2023-10-08 DIAGNOSIS — Z860100 Personal history of colon polyps, unspecified: Secondary | ICD-10-CM

## 2023-10-08 DIAGNOSIS — Z09 Encounter for follow-up examination after completed treatment for conditions other than malignant neoplasm: Secondary | ICD-10-CM

## 2023-10-08 NOTE — Progress Notes (Signed)
 Wyline Mood MD, MRCP(U.K) [address]  [address]  [place], [place] [address]  Main: [phone]  Fax: [phone]   Gastroenterology Consultation  Referring Provider:     Lynnea Ferrier, MD Primary Care Physician:  Lynnea Ferrier, MD Primary Gastroenterologist:  Dr. Wyline Mood  Reason for Consultation:     History of colon polyps        HPI:   Paul Choi is a 78 y.o. y/o male referred for consultation & management  by Dr. Graciela Husbands, Susanne Borders III, MD.    He is here today to discuss about a surveillance colonoscopy.  He denies any change in bowel habits no rectal bleeding no unintentional weight loss.  He was told he need to have repeat colonoscopy due to polyps after his last 1.  He has come to discuss the risks versus benefits of the same.  09/02/2020: colonoscopy Dr Lemar Livings 4 x 6 mm polyps resected and all were tubular adenomas.  06/26/2017: Colonoscopy: One 5 mm polyp in the ascending colon and 3 x5 mm polyp in the hepatic flexure there were tubular adenomas. 10/19/2011: 1 colonoscopy one 5 mm polyp in the ascending colon resected  Past Medical History:  Diagnosis Date   Actinic keratosis    Arteriosclerotic cardiovascular disease    Arthritis 1997   Coronary artery disease    Cardiac catheterization in August 2003 with occluded left circumflex. Echo 8/13 with mild segmental LV dysfunction, EF 40%, moderate mitral insufficiency and mild tricuspid insufficiency; aortic valve appears bicuspid   Diabetes mellitus without complication (HCC)    TYPE 2    Dysplastic nevus 09/27/2017   Left flank. Mild atypia, irritated, limited margins free.   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 Severe atypia, close to lateral margin. Excised 10/23/2017, margins free.   Dysplastic nevus 10/09/2018   Right lower abdomen. Moderate atypia, limited margins free.    Dysplastic nevus 04/02/2019   Low back mid line. Moderate atypia, irritated, limited magins free.   Dysplastic  nevus 06/21/2022   Right dorsal foot. Moderate atypia, margins free.   ED (erectile dysfunction)    Gout    Gout    Heart disease    History of dysplastic nevus 05/18/2020   left lateral inferior chest, moderate   Hyperlipidemia    Hypertension 1982   Insomnia    Personal history of colonic polyps    Renal insufficiency     Past Surgical History:  Procedure Laterality Date   CARDIAC CATHETERIZATION  2006   CARDIAC CATHETERIZATION N/A 05/04/2016   Procedure: Left Heart Cath and Coronary Angiography;  Surgeon: Tonny Bollman, MD;  Location: [facility] INVASIVE CV LAB;  Service: Cardiovascular;  Laterality: N/A;   CARDIAC CATHETERIZATION N/A 05/04/2016   Procedure: Coronary Stent Intervention;  Surgeon: Tonny Bollman, MD;  Location: [facility] INVASIVE CV LAB;  Service: Cardiovascular;  Laterality: N/A;   COLONOSCOPY  2007   COLONOSCOPY WITH PROPOFOL N/A 06/26/2017   Procedure: COLONOSCOPY WITH PROPOFOL;  Surgeon: Earline Mayotte, MD;  Location: [facility] ENDOSCOPY;  Service: Endoscopy;  Laterality: N/A;   COLONOSCOPY WITH PROPOFOL N/A 09/02/2020   Procedure: COLONOSCOPY WITH PROPOFOL;  Surgeon: Earline Mayotte, MD;  Location: [facility] ENDOSCOPY;  Service: Endoscopy;  Laterality: N/A;  COVID POSITIVE AT [facility] ON 08/22/2020 - ON CHART   CORONARY STENT PLACEMENT  05/04/2016   proximal RCA treated successfully with coronary stenting using a 3.5 x 16 mm Promus drug-eluting stent   CORONARY STENT  PLACEMENT  05/04/2016   Mid LAD lesion, 30 %stenosed.   HERNIA REPAIR Left 08/23/2006   Direct inguinal hernia repair with a large ultra Pro mesh.   KNEE SURGERY  1988    Prior to Admission medications   Medication Sig Start Date End Date Taking? Authorizing Provider  allopurinol (ZYLOPRIM) 100 MG tablet Take 200 mg by mouth every evening.     [provider]  amLODipine (NORVASC) 5 MG tablet Take 5 mg by mouth every evening.     [provider]  aspirin EC 81 MG tablet Take 81 mg by mouth daily.     [provider]  atorvastatin (LIPITOR) 80 MG tablet Take 1 tablet (80 mg total) by mouth daily. 11/29/22   Duke Salvia, MD  cholecalciferol (VITAMIN D) 1000 units tablet Take 1,000 Units by mouth every morning.     [provider]  colchicine 0.6 MG tablet Take 0.6 mg by mouth daily as needed (for gout flare ups.).     [provider]  docusate sodium (COLACE) 100 MG capsule Take 200-300 mg by mouth daily as needed for mild constipation.    [provider]  losartan (COZAAR) 100 MG tablet Take 1 tablet (100 mg total) by mouth every evening. 05/06/16   Beatrice Lecher, PA-C    Family History  Problem Relation Age of Onset   Hypertension Mother    Hypertension Brother      Social History   Tobacco Use   Smoking status: Former    Current packs/day: 0.00    Average packs/day: 1 pack/day for 25.0 years (25.0 ttl pk-yrs)    Types: Cigarettes    Start date: 09/21/1970    Quit date: 09/22/1995    Years since quitting: 28.0   Smokeless tobacco: Never  Substance Use Topics   Alcohol use: Yes    Comment: OCCASIONAL   Drug use: No    Allergies as of 10/08/2023 - Review Complete 07/04/2023  Allergen Reaction Noted   Ace inhibitors Cough 02/15/2014   Contrast media [iodinated contrast media] Other (See Comments) 02/29/2016   Lovastatin Other (See Comments) 02/02/2016   Other Nausea Only, Hives, and Cough 02/02/2016   Shellfish allergy Hives 03/05/2013   Trazodone Other (See Comments) 02/02/2016    Review of Systems:    All systems reviewed and negative except where noted in HPI.   Physical Exam:  There were no vitals taken for this visit. No LMP for male patient. Psych:  Alert and cooperative. Normal mood and affect. General:   Alert,  Well-developed, well-nourished, pleasant and cooperative in NAD Head:  Normocephalic and atraumatic. Eyes:  Sclera clear, no icterus.   Conjunctiva pink.  Neurologic:  Alert and oriented x3;  grossly normal  neurologically. Psych:  Alert and cooperative. Normal mood and affect.  Imaging Studies: No results found.  Assessment and Plan:   Paul Choi is a 78 y.o. y/o male has been referred for evaluation about need for colonoscopy.  He has had 3 prior colonoscopies for the past 12 years and multiple diminutive and small polyps have been resected.  Discussed with him our society recommendations about risk versus benefits as 1 gets over the age of 49 and after a long discussion he agreed with to avoid any colonoscopy as the risks exceed the benefits.  In addition his colonoscopy in the past that showed diminutive polyps and no large high risk polyps.  Obviously if he has any red flag signs or symptoms  he is welcome to come back and see me to have a discussion about any procedures in the future  Follow up in as needed  Dr Wyline Mood MD,MRCP(U.K)

## 2023-10-28 ENCOUNTER — Other Ambulatory Visit: Payer: Self-pay | Admitting: Internal Medicine

## 2023-11-18 ENCOUNTER — Ambulatory Visit: Attending: Medical | Admitting: Medical

## 2023-11-18 ENCOUNTER — Telehealth: Payer: Self-pay | Admitting: Internal Medicine

## 2023-11-18 ENCOUNTER — Encounter: Payer: Self-pay | Admitting: Medical

## 2023-11-18 VITALS — BP 110/64 | HR 72 | Ht 72.0 in | Wt 188.6 lb

## 2023-11-18 DIAGNOSIS — R002 Palpitations: Secondary | ICD-10-CM | POA: Diagnosis not present

## 2023-11-18 DIAGNOSIS — I25110 Atherosclerotic heart disease of native coronary artery with unstable angina pectoris: Secondary | ICD-10-CM | POA: Insufficient documentation

## 2023-11-18 DIAGNOSIS — I5032 Chronic diastolic (congestive) heart failure: Secondary | ICD-10-CM | POA: Diagnosis present

## 2023-11-18 DIAGNOSIS — E782 Mixed hyperlipidemia: Secondary | ICD-10-CM | POA: Diagnosis present

## 2023-11-18 DIAGNOSIS — I200 Unstable angina: Secondary | ICD-10-CM | POA: Insufficient documentation

## 2023-11-18 DIAGNOSIS — I493 Ventricular premature depolarization: Secondary | ICD-10-CM | POA: Insufficient documentation

## 2023-11-18 DIAGNOSIS — I7789 Other specified disorders of arteries and arterioles: Secondary | ICD-10-CM | POA: Diagnosis present

## 2023-11-18 DIAGNOSIS — R0602 Shortness of breath: Secondary | ICD-10-CM | POA: Insufficient documentation

## 2023-11-18 MED ORDER — NITROGLYCERIN 0.4 MG SL SUBL: 0.4000 mg | SUBLINGUAL_TABLET | SUBLINGUAL | 3 refills | Status: DC | PRN

## 2023-11-18 NOTE — Telephone Encounter (Signed)
 Spoke with patient. Patient with complaint of dyspnea with exertion for 2 weeks. Patient denies any other symptoms. Patient reports recent blood pressure 133/76 and heart rate in the 70's. Patient scheduled to be seen in clinic 11/18/23.

## 2023-11-18 NOTE — Progress Notes (Signed)
 Cardiology Office Note:  .   Date:  11/18/2023  ID:  Paul Choi, DOB 1945/08/22, MRN [id number] PCP: Lynnea Ferrier, MD  [facility] Providers Cardiologist:  None     History of Present Illness: .   Paul Choi is a 78 y.o. male with a history of CAD s/p PCI RCA 2017, HFimpEF, PVCs and hypertension who presents for follow-up.  Left heart cath in 2017 showed critical stenosis of the proximal RCA treated with coronary stenting, chronic occlusion of the mid circumflex with first OM branch collateralized from the LAD and right coronary artery, moderate stenosis of large ramus intermedius branch, diffuse nonobstructive stenosis of the LAD.  Echo in 2018 showed EF of 45 to 50%.  Echo in 2019 showed EF of 55 to 60%.  Patient has been followed by Dr. Graciela Husbands over the years. Most recent echo 5.2024 showed LVEF 50-55%, no WMA, mild LVH, normal RVSF, mild MR, mild AI.   Today, the patient reports he has been having SOB on exertion. This started a couple weeks ago. He felt it is worse when he walks up hill. He denies chest pain. He has a lower leg edema from amlodipine. He denies heart racing or palpitations.   Studies Reviewed: Marland Kitchen   EKG Interpretation Date/Time:  Monday November 18 2023 10:34:51 EDT Ventricular Rate:  72 PR Interval:  200 QRS Duration:  94 QT Interval:  394 QTC Calculation: 431 R Axis:   23  Text Interpretation: Sinus rhythm with frequent Premature ventricular complexes When compared with ECG of 05-May-2016 07:52, No significant change was found Confirmed by Fransico Michael, Dhanush Jokerst ([id number]) on 11/18/2023 12:18:38 PM    Echo 12/2022  1. Left ventricular ejection fraction, by estimation, is 50 to 55%. Left  ventricular ejection fraction by 2D MOD biplane is 51.5 %. The left  ventricle has low normal function. The left ventricle has no regional wall  motion abnormalities. There is mild  left ventricular hypertrophy of the basal-septal segment. Left ventricular  diastolic  parameters were normal.   2. Right ventricular systolic function is normal. The right ventricular  size is normal.   3. The mitral valve is normal in structure. Mild mitral valve  regurgitation.   4. The aortic valve is calcified. Aortic valve regurgitation is mild.  Aortic valve sclerosis/calcification is present, without any evidence of  aortic stenosis.   5. Aortic dilatation noted. There is mild dilatation of the aortic root  and of the ascending aorta, measuring 41 mm.   6. The inferior vena cava is normal in size with greater than 50%  respiratory variability, suggesting right atrial pressure of 3 mmHg.   Echocardiogram 12/27/16 EF 50, normal wall motion, grade 1 diastolic dysfunction, functionally bicuspid aortic valve (mean 6, peak 12), dilated aortic root (40 mm), dilated ascending aorta (41 mm), mild MR, mild BAE   Carotid US 4/18 Bilateral <50   Cardiac Catheterization 05/04/16 LAD proximal 30, mid 30 RI 60 LCx proximal 100; OM1, OM2 and lateral marginal 2 all filled by collaterals from first septal perforator RCA proximal 95, mid 40 PCI: 3.5 x 16 mm Promus Premier DES to the proximal RCA    Nuclear stress test 04/11/16 Lateral and inferolateral defect predominantly fixed with small area of peri-infarct ischemia, EF 42, moderate to high risk scan   Coronary CTA 02/29/16 IMPRESSION: 1. Anatomically tricuspid, but functionally bicuspid aortic valve with co-joined left and right aortic leaflet and minimally restricted leaflet opening with no evidence of  stenosis. 2. Mildly dilated aortic root with maximum diameter 40 mm. 3. 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 An annual follow up with CTA or MRA is recommended. No prior study is available for comparison. 4. Coronary calcium score of 750. This was 20 percentile for age and sex matched control. 5. Diffuse mild to moderate CAD, with a severe mixed plaque in the proximal RCA associated with  70-99% stenosis. A stress test is Recommended.   Echocardiogram 02/10/16 EF 55-60, grade 1 diastolic dysfunction, trivial AI, AV gradient mean 6/peak 12, mildly dilated ascending aorta (41 mm), mild dilated aortic root (40 mm), normal RVSF   Holter 6/17 PVC burden 6.3         Physical Exam:   VS:  BP 110/64 (BP Location: Left Arm, Patient Position: Sitting)   Pulse 72   Ht 6' (1.829 m)   Wt 188 lb 9.6 oz (85.5 kg)   SpO2 99%   BMI 25.58 kg/m    Wt Readings from Last 3 Encounters:  11/18/23 188 lb 9.6 oz (85.5 kg)  10/08/23 191 lb (86.6 kg)  11/29/22 190 lb 3.2 oz (86.3 kg)    GEN: Well nourished, well developed in no acute distress NECK: No JVD; No carotid bruits CARDIAC: RRR, no murmurs, rubs, gallops RESPIRATORY:  Clear to auscultation without rales, wheezing or rhonchi  ABDOMEN: Soft, non-tender, non-distended EXTREMITIES:  No edema; No deformity   ASSESSMENT AND PLAN: .    SOB/Unstable Angina CAD s/p DES RCA 2018 Patient reports progressive shortness of breath with exertion.  He denies chest pain.  Symptoms not similar to prior stenting as he did not have symptoms at that time.  Question of unstable angina.  EKG today shows normal sinus rhythm, frequent PVCs, no ischemic changes.  I will order a cardiac PET stress test.  Continue aspirin 81 mg daily, Lipitor 80 mg daily.  I will send in sublingual nitro.  Of note, he has a dye allergy.  If he were to require heart cath he would need premedication for dye allergy.  Palpitations/PVCs Remote heart monitor showed PVC burden of 6.3%.  EKG today shows NSR, frequent PVCs/ventricular bigeminy.  Patient is not on a beta-blocker.  Can consider repeat heart monitor at follow-up.  HFimpEF Echo in 2018 showed LVEF 45-50%. Repeat echo in 2019 showed normalization of EF.  Most recent echo in May 2024 showed EF of 50 to 55%, mild LVH, normal diastolic parameters, mild MR, mild AI, mild dilation of the aortic root and ascending aorta  measuring 41 mm.  Patient has lower leg edema from amlodipine.  Continue losartan 100 mg daily.  Mild aortic root dilation Echo in May 2024 showed mild dilation of the aortic root as of the ascending aorta measuring 41 mm.  Can repeat echo at follow-up.  Hyperlipidemia LDL 55.  Continue Lipitor 80 mg daily.     Informed Consent   Shared Decision Making/Informed Consent The risks [chest pain, shortness of breath, cardiac arrhythmias, dizziness, blood pressure fluctuations, myocardial infarction, stroke/transient ischemic attack, nausea, vomiting, allergic reaction, radiation exposure, metallic taste sensation and life-threatening complications (estimated to be 1 in 10,000)], benefits (risk stratification, diagnosing coronary artery disease, treatment guidance) and alternatives of a cardiac PET stress test were discussed in detail with Mr. Steely and he agrees to proceed.     Dispo: Follow-up in 1 month  Signed, Romualdo Prosise David Stall, PA-C

## 2023-11-18 NOTE — Telephone Encounter (Signed)
 Pt c/o Shortness Of Breath: STAT if SOB developed within the last 24 hours or pt is noticeably SOB on the phone  1. Are you currently SOB (can you hear that pt is SOB on the phone)? Yes, a little  2. How long have you been experiencing SOB? About 2 weeks  3. Are you SOB when sitting or when up moving around?  Moving around  4. Are you currently experiencing any other symptoms? no

## 2023-11-18 NOTE — Patient Instructions (Addendum)
 Medication Instructions:  Your physician recommends the following medication changes. A prescription has been sent in for Nitroglycerin.  If you have chest pain that doesn't relieve quickly, place one tablet under your tongue and allow it to dissolve.  If no relief after 5 minutes, you may take another pill.  If no relief after 5 minutes, you may take a 3rd dose but you need to call 911 and report to ER immediately.   *If you need a refill on your cardiac medications before your next appointment, please call your pharmacy*  Lab Work: No labs ordered today   Testing/Procedures: Your physician has requested that you have an echocardiogram. Echocardiography is a painless test that uses sound waves to create images of your heart. It provides your doctor with information about the size and shape of your heart and how well your heart's chambers and valves are working.   You may receive an ultrasound enhancing agent through an IV if needed to better visualize your heart during the echo. This procedure takes approximately one hour.  There are no restrictions for this procedure.  This will take place at [address] [facility] [address] [facility]) [address], [place] [address]  Please note: We ask at that you not bring children with you during ultrasound (echo/ vascular) testing. Due to room size and safety concerns, children are not allowed in the ultrasound rooms during exams. Our front office staff cannot provide observation of children in our lobby area while testing is being conducted. An adult accompanying a patient to their appointment will only be allowed in the ultrasound room at the discretion of the ultrasound technician under special circumstances. We apologize for any inconvenience.    Please report to Radiology at [facility] Main Entrance, medical mall, 30 mins prior to your test.  [address]  [place], [place]  How to Prepare for Your Cardiac PET/CT Stress  Test:  Nothing to eat or drink, except water, 3 hours prior to arrival time.  NO caffeine/decaffeinated products, or chocolate 12 hours prior to arrival. (Please note decaffeinated beverages (teas/coffees) still contain caffeine).  If you have caffeine within 12 hours prior, the test will need to be rescheduled.  Medication instructions: Do not take erectile dysfunction medications for 72 hours prior to test (sildenafil, tadalafil) Do not take nitrates (isosorbide mononitrate, Ranexa) the day before or day of test Do not take tamsulosin the day before or morning of test Hold theophylline containing medications for 12 hours. Hold Dipyridamole 48 hours prior to the test.  Diabetic Preparation: If able to eat breakfast prior to 3 hour fasting, you may take all medications, including your insulin. Do not worry if you miss your breakfast dose of insulin - start at your next meal. If you do not eat prior to 3 hour fast-Hold all diabetes (oral and insulin) medications. Patients who wear a continuous glucose monitor MUST remove the device prior to scanning.  You may take your remaining medications with water.  NO perfume, cologne or lotion on chest or abdomen area. FEMALES - Please avoid wearing dresses to this appointment.  Total time is 1 to 2 hours; you may want to bring reading material for the waiting time.  IF YOU THINK YOU MAY BE PREGNANT, OR ARE NURSING PLEASE INFORM THE TECHNOLOGIST.  In preparation for your appointment, medication and supplies will be purchased.  Appointment availability is limited, so if you need to cancel or reschedule, please call the Radiology Department Scheduler at [phone] 24  hours in advance to avoid a cancellation fee of $100.00  What to Expect When you Arrive:  Once you arrive and check in for your appointment, you will be taken to a preparation room within the Radiology Department.  A technologist or Nurse will obtain your medical history, verify that  you are correctly prepped for the exam, and explain the procedure.  Afterwards, an IV will be started in your arm and electrodes will be placed on your skin for EKG monitoring during the stress portion of the exam. Then you will be escorted to the PET/CT scanner.  There, staff will get you positioned on the scanner and obtain a blood pressure and EKG.  During the exam, you will continue to be connected to the EKG and blood pressure machines.  A small, safe amount of a radioactive tracer will be injected in your IV to obtain a series of pictures of your heart along with an injection of a stress agent.    After your Exam:  It is recommended that you eat a meal and drink a caffeinated beverage to counter act any effects of the stress agent.  Drink plenty of fluids for the remainder of the day and urinate frequently for the first couple of hours after the exam.  Your doctor will inform you of your test results within 7-10 business days.  For more information and frequently asked questions, please visit our website: [email or web address]  For questions about your test or how to prepare for your test, please call: Cardiac Imaging Nurse Navigators Office: [phone]   Follow-Up: At [facility], you and your health needs are our priority.  As part of our continuing mission to provide you with exceptional heart care, our providers are all part of one team.  This team includes your primary Cardiologist (physician) and Advanced Practice Providers or APPs (Physician Assistants and Nurse Practitioners) who all work together to provide you with the care you need, when you need it.  Your next appointment:   1 month(s)  Provider:   Cadence Fransico Michael, PA-C  We recommend signing up for the patient portal called "MyChart".  Sign up information is provided on this After Visit Summary.  MyChart is used to connect with patients for Virtual Visits (Telemedicine).  Patients are able to view  lab/test results, encounter notes, upcoming appointments, etc.  Non-urgent messages can be sent to your provider as well.   To learn more about what you can do with MyChart, go to [email or web address].

## 2023-12-03 ENCOUNTER — Encounter (HOSPITAL_COMMUNITY): Payer: Self-pay

## 2023-12-04 ENCOUNTER — Telehealth (HOSPITAL_COMMUNITY): Payer: Self-pay | Admitting: Emergency Medicine

## 2023-12-04 NOTE — Telephone Encounter (Signed)
Attempted to call patient regarding upcoming cardiac PET appointment. Left message on voicemail with name and callback number Aidan Moten RN Navigator Cardiac Imaging [facility] [phone] Office [phone] Cell  

## 2023-12-04 NOTE — Telephone Encounter (Signed)
 Reaching out to patient to offer assistance regarding upcoming cardiac imaging study; pt verbalizes understanding of appt date/time, parking situation and where to check in, pre-test NPO status and medications ordered, and verified current allergies; name and call back number provided for further questions should they arise Rockwell Alexandria RN Navigator Cardiac Imaging [facility] [phone] office [phone] cell

## 2023-12-05 ENCOUNTER — Ambulatory Visit: Admission: RE | Admit: 2023-12-05 | Source: Ambulatory Visit

## 2023-12-05 ENCOUNTER — Telehealth: Payer: Self-pay | Admitting: Medical

## 2023-12-05 NOTE — Telephone Encounter (Signed)
 Fredric Jean with [facility] called in to inform Cadence, PA that pt PET scan had to be cancelled today due to issue with machine just in case pt has any f/u appt that needs to be pushed back. Her new appt is 01/16/24

## 2023-12-12 ENCOUNTER — Ambulatory Visit: Admission: RE | Admit: 2023-12-12 | Source: Ambulatory Visit

## 2023-12-12 ENCOUNTER — Ambulatory Visit: Attending: Medical

## 2023-12-12 DIAGNOSIS — R0602 Shortness of breath: Secondary | ICD-10-CM | POA: Diagnosis present

## 2023-12-12 LAB — ECHOCARDIOGRAM COMPLETE
AR max vel: 2.83 cm2
AV Area VTI: 3.13 cm2
AV Area mean vel: 3.04 cm2
AV Mean grad: 8 mmHg
AV Peak grad: 14.6 mmHg
Ao pk vel: 1.91 m/s
Area-P 1/2: 3.37 cm2
S' Lateral: 3.59 cm

## 2023-12-14 ENCOUNTER — Emergency Department

## 2023-12-14 ENCOUNTER — Other Ambulatory Visit: Payer: Self-pay

## 2023-12-14 ENCOUNTER — Emergency Department
Admission: EM | Admit: 2023-12-14 | Discharge: 2023-12-14 | Disposition: A | Discharge status: Home / Self Care | Attending: Emergency Medicine | Admitting: Emergency Medicine

## 2023-12-14 DIAGNOSIS — L089 Local infection of the skin and subcutaneous tissue, unspecified: Secondary | ICD-10-CM | POA: Insufficient documentation

## 2023-12-14 DIAGNOSIS — M7121 Synovial cyst of popliteal space [Baker], right knee: Secondary | ICD-10-CM

## 2023-12-14 LAB — COMPREHENSIVE METABOLIC PANEL WITH GFR
ALT: 16 U/L (ref 0–44)
AST: 20 U/L (ref 15–41)
Albumin: 3.7 g/dL (ref 3.5–5.0)
Alkaline Phosphatase: 103 U/L (ref 38–126)
Anion gap: 9 (ref 5–15)
BUN: 35 mg/dL — ABNORMAL HIGH (ref 8–23)
CO2: 21 mmol/L — ABNORMAL LOW (ref 22–32)
Calcium: 9.2 mg/dL (ref 8.9–10.3)
Chloride: 104 mmol/L (ref 98–111)
Creatinine, Ser: 1.7 mg/dL — ABNORMAL HIGH (ref 0.61–1.24)
GFR, Estimated: 40 mL/min — ABNORMAL LOW (ref >60)
Glucose, Bld: 99 mg/dL (ref 70–99)
Potassium: 5 mmol/L (ref 3.5–5.1)
Sodium: 134 mmol/L — ABNORMAL LOW (ref 135–145)
Total Bilirubin: 0.5 mg/dL (ref 0.0–1.2)
Total Protein: 7.4 g/dL (ref 6.5–8.1)

## 2023-12-14 LAB — CBC WITH DIFFERENTIAL/PLATELET
Abs Immature Granulocytes: 0.04 10*3/uL (ref 0.00–0.07)
Basophils Absolute: 0.1 10*3/uL (ref 0.0–0.1)
Basophils Relative: 1 %
Eosinophils Absolute: 0.3 10*3/uL (ref 0.0–0.5)
Eosinophils Relative: 3 %
HCT: 35 % — ABNORMAL LOW (ref 39.0–52.0)
Hemoglobin: 11.8 g/dL — ABNORMAL LOW (ref 13.0–17.0)
Immature Granulocytes: 1 %
Lymphocytes Relative: 20 %
Lymphs Abs: 1.8 10*3/uL (ref 0.7–4.0)
MCH: 31.1 pg (ref 26.0–34.0)
MCHC: 33.7 g/dL (ref 30.0–36.0)
MCV: 92.3 fL (ref 80.0–100.0)
Monocytes Absolute: 1 10*3/uL (ref 0.1–1.0)
Monocytes Relative: 11 %
Neutro Abs: 5.7 10*3/uL (ref 1.7–7.7)
Neutrophils Relative %: 64 %
Platelets: 252 10*3/uL (ref 150–400)
RBC: 3.79 MIL/uL — ABNORMAL LOW (ref 4.22–5.81)
RDW: 14.3 % (ref 11.5–15.5)
WBC: 8.8 10*3/uL (ref 4.0–10.5)
nRBC: 0 % (ref 0.0–0.2)

## 2023-12-14 LAB — LACTIC ACID, PLASMA: Lactic Acid, Venous: 0.9 mmol/L (ref 0.5–1.9)

## 2023-12-14 MED ORDER — DOXYCYCLINE HYCLATE 100 MG PO TABS
100.0000 mg | ORAL_TABLET | Freq: Once | ORAL | Status: AC
  Administered 2023-12-14: 100 mg via ORAL
  Filled 2023-12-14: qty 1

## 2023-12-14 MED ORDER — DOXYCYCLINE HYCLATE 100 MG PO TABS: 100.0000 mg | ORAL_TABLET | Freq: Two times a day (BID) | ORAL | 0 refills | Status: AC

## 2023-12-14 NOTE — ED Notes (Signed)
 Pt offered Post op shoe. Pt declining at this time.

## 2023-12-14 NOTE — ED Provider Notes (Signed)
 [facility] Provider Note    Event Date/Time   First MD Initiated Contact with Patient 12/14/23 1537     (approximate)   History   Wound Infection   HPI  Paul Choi is a 78 y.o. male who comes in with concerns for toe infection.  Patient was seen by podiatry 4/24 and prescribed cefuroxime.  Patient has been on the antibiotics for 2 days.  He reports that the redness is about the same but he noticed some increased swelling in the entire right leg so came in to have it evaluated.  He denies any worsening pain, fevers, worsening redness.  Patient reports still able to ambulate.   I reviewed the office visit from 12/12/2023.  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  CV:  Good peripheral perfusion.  Resp:  Normal effort.  Abd:  No distention.  Other:  Patient has some redness noted to the second digit on the right foot.  Picture in media tab.  He is got some edema noted in the right leg.  Good distal pulse   ED Results / Procedures / Treatments   Labs (all labs ordered are listed, but only abnormal results are displayed) Labs Reviewed  CBC WITH DIFFERENTIAL/PLATELET - Abnormal; Notable for the following components:      Result Value   RBC 3.79 (*)    Hemoglobin 11.8 (*)    HCT 35.0 (*)    All other components within normal limits  LACTIC  ACID, PLASMA  LACTIC ACID, PLASMA  COMPREHENSIVE METABOLIC PANEL WITH GFR  URINALYSIS, W/ REFLEX TO CULTURE (INFECTION SUSPECTED)     RADIOLOGY I have reviewed the ultrasound personally and interpreted + baker cyst    PROCEDURES:  Critical Care performed: No  Procedures   MEDICATIONS ORDERED IN ED: Medications - No data to display   IMPRESSION / MDM / ASSESSMENT AND PLAN / ED COURSE  I reviewed the triage vital signs and the nursing notes.   Patient's presentation is most consistent with acute presentation with potential threat to life or bodily function.   I suspect the swelling could be just reactive secondary to the cellulitis.  X-ray was just done 2 days ago doubt osteo.  Ultrasound to rule out DVT.  CBC shows normal white count hemoglobin stable CMP slightly elevated creatinine but creatinine was 1.42 months ago Lactate normal  Discussed the case with Dr. Larey Plenty wound care with betadine wet to dry dressings daily in a  surgical shoe and continue antibiotics. Maybe collect a wound culture and add doxy in case of MRSA in addition to the cephalosporin he is taking for a week.  Discussed with patient he declines the surgical shoe.  Also did not want to use the Betadine and would start off with some saline wet-to-dry dressings.  He states that the redness really has not gotten any worse and so he wants to continue the cephalosporin and is okay with adding the doxycycline.  Wound culture collected.  We discussed the Baker's cyst on ultrasound which would explain the unilateral right leg swelling.  He is got no redness, no warmth underneath the knee, full range of motion of the knee doubt abscess.  At this time patient feels comfortable with discharge home and I considered admission to the hospital given continuing to have the redness but he prefers to trial outpatient with the doxycycline and continued cefuroxime he use.  I think this is reasonable given how well-appearing patient  is.  The patient is on the cardiac monitor to evaluate for evidence of arrhythmia and/or significant heart rate changes.      FINAL CLINICAL IMPRESSION(S) / ED DIAGNOSES   Final diagnoses:  Toe infection     Rx / DC Orders   ED Discharge Orders          Ordered    doxycycline (VIBRA-TABS) 100 MG tablet  2 times daily        12/14/23 1839             Note:  This document was prepared using Dragon voice recognition software and may include unintentional dictation errors.   Lubertha Rush, MD 12/14/23 [phone]

## 2023-12-14 NOTE — Discharge Instructions (Addendum)
 We have added additional antibiotic for your toe and you should call your podiatrist to try to make a follow-up appointment next week if you are not seeing improvement return to the ER if you develop fevers, worsening redness, swelling or any other concerns.    MPRESSION:  1. No right lower extremity DVT.  2. 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

## 2023-12-14 NOTE — ED Triage Notes (Signed)
 Pt to ed from home via POV for a toe infection. Pt was seen by podiatry yesterday and prescribed cefuroxime. Pt is caox4, in no acute distress and ambulatory in triage. Pt feels like his leg is more swollen than it has been.

## 2023-12-20 LAB — AEROBIC/ANAEROBIC CULTURE W GRAM STAIN (SURGICAL/DEEP WOUND): Gram Stain: NONE SEEN

## 2023-12-24 ENCOUNTER — Ambulatory Visit: Admitting: Medical

## 2023-12-26 ENCOUNTER — Ambulatory Visit: Admitting: Medical

## 2023-12-31 ENCOUNTER — Encounter (HOSPITAL_COMMUNITY): Payer: Self-pay

## 2024-01-02 ENCOUNTER — Ambulatory Visit
Admission: RE | Admit: 2024-01-02 | Discharge: 2024-01-02 | Disposition: A | Discharge status: Home / Self Care | Source: Ambulatory Visit | Attending: Medical | Admitting: Medical

## 2024-01-02 DIAGNOSIS — I700 Atherosclerosis of aorta: Secondary | ICD-10-CM | POA: Diagnosis not present

## 2024-01-02 DIAGNOSIS — I517 Cardiomegaly: Secondary | ICD-10-CM | POA: Insufficient documentation

## 2024-01-02 DIAGNOSIS — J439 Emphysema, unspecified: Secondary | ICD-10-CM | POA: Insufficient documentation

## 2024-01-02 DIAGNOSIS — I25110 Atherosclerotic heart disease of native coronary artery with unstable angina pectoris: Secondary | ICD-10-CM | POA: Diagnosis not present

## 2024-01-02 DIAGNOSIS — I200 Unstable angina: Secondary | ICD-10-CM

## 2024-01-02 LAB — NM PET CT CARDIAC PERFUSION MULTI W/ABSOLUTE BLOODFLOW
LV dias vol: 109 mL (ref 62–150)
LV sys vol: 76 mL
MBFR: 1.34
Nuc Rest EF: 42 %
Nuc Stress EF: 39 %
Peak HR: 88 {beats}/min
Rest HR: 78 {beats}/min
Rest MBF: 1.03 ml/g/min
Rest Nuclear Isotope Dose: 21.3 mCi
SRS: 9
SSS: 14
ST Depression (mm): 0 mm
Stress MBF: 1.38 ml/g/min
Stress Nuclear Isotope Dose: 21.2 mCi
TID: 0.96

## 2024-01-02 MED ORDER — REGADENOSON 0.4 MG/5ML IV SOLN
INTRAVENOUS | Status: AC
  Filled 2024-01-02: qty 5

## 2024-01-02 MED ORDER — RUBIDIUM RB82 GENERATOR (RUBYFILL)
25.0000 | PACK | Freq: Once | INTRAVENOUS | Status: AC
  Administered 2024-01-02: 21.28 via INTRAVENOUS

## 2024-01-02 MED ORDER — RUBIDIUM RB82 GENERATOR (RUBYFILL)
25.0000 | PACK | Freq: Once | INTRAVENOUS | Status: AC
  Administered 2024-01-02: 21.21 via INTRAVENOUS

## 2024-01-02 MED ORDER — REGADENOSON 0.4 MG/5ML IV SOLN
0.4000 mg | Freq: Once | INTRAVENOUS | Status: AC
  Administered 2024-01-02: 0.4 mg via INTRAVENOUS
  Filled 2024-01-02: qty 5

## 2024-01-02 NOTE — Progress Notes (Signed)
 Patient presents for a cardiac PET stress test and tolerated procedure without incident. Patient maintained acceptable vital signs throughout the test and was offered caffeine after test.  Patient ambulated out of department with a steady gait.

## 2024-01-06 ENCOUNTER — Ambulatory Visit: Payer: Self-pay | Admitting: Medical

## 2024-01-16 ENCOUNTER — Other Ambulatory Visit

## 2024-01-16 ENCOUNTER — Telehealth: Payer: Self-pay

## 2024-01-16 NOTE — Telephone Encounter (Signed)
 The patient has been notified of the result along with recommendations. Pt verbalized understanding. All questions (if any) were answered    Cadence Rebekah Canada, PA-C 01/06/2024  4:07 PM EDT     Stress test was abnormal showing prior infarct with possibly new blockages around it, EF mildly reduced, high risk study. Can further discuss at follow-up

## 2024-01-21 ENCOUNTER — Ambulatory Visit: Attending: Medical | Admitting: Medical

## 2024-01-21 ENCOUNTER — Encounter: Payer: Self-pay | Admitting: Medical

## 2024-01-21 VITALS — BP 140/60 | HR 66 | Ht 72.0 in | Wt 182.0 lb

## 2024-01-21 DIAGNOSIS — E782 Mixed hyperlipidemia: Secondary | ICD-10-CM | POA: Insufficient documentation

## 2024-01-21 DIAGNOSIS — I5032 Chronic diastolic (congestive) heart failure: Secondary | ICD-10-CM | POA: Insufficient documentation

## 2024-01-21 DIAGNOSIS — I493 Ventricular premature depolarization: Secondary | ICD-10-CM | POA: Insufficient documentation

## 2024-01-21 DIAGNOSIS — R002 Palpitations: Secondary | ICD-10-CM | POA: Diagnosis present

## 2024-01-21 DIAGNOSIS — I25110 Atherosclerotic heart disease of native coronary artery with unstable angina pectoris: Secondary | ICD-10-CM | POA: Diagnosis not present

## 2024-01-21 DIAGNOSIS — R0602 Shortness of breath: Secondary | ICD-10-CM | POA: Insufficient documentation

## 2024-01-21 NOTE — Patient Instructions (Signed)
 Medication Instructions:  Your physician recommends that you continue on your current medications as directed. Please refer to the Current Medication list given to you today.   *If you need a refill on your cardiac medications before your next appointment, please call your pharmacy*  Lab Work: No labs ordered today   Testing/Procedures: No test ordered today   Follow-Up: At [facility], you and your health needs are our priority.  As part of our continuing mission to provide you with exceptional heart care, our providers are all part of one team.  This team includes your primary Cardiologist (physician) and Advanced Practice Providers or APPs (Physician Assistants and Nurse Practitioners) who all work together to provide you with the care you need, when you need it.  Your next appointment:   1-3 month(s)  Provider:   Dr. Arlester Ladd   We recommend signing up for the patient portal called "MyChart".  Sign up information is provided on this After Visit Summary.  MyChart is used to connect with patients for Virtual Visits (Telemedicine).  Patients are able to view lab/test results, encounter notes, upcoming appointments, etc.  Non-urgent messages can be sent to your provider as well.   To learn more about what you can do with MyChart, go to [email or web address].

## 2024-01-21 NOTE — Progress Notes (Signed)
 Cardiology Office Note   Date:  01/21/2024  ID:  ESKER Paul Choi, DOB [birth year], MRN [id number] PCP: Paul Spoon, MD  [facility] Providers Cardiologist:  Arnoldo Lapping, MD   History of Present Illness Paul Choi is a 78 y.o. male with a history of CAD s/p PCI RCA 2017, HFimpEF, PVCs and hypertension who presents for follow-up for CAD.   Left heart cath in 2017 showed critical stenosis of the proximal RCA treated with coronary stenting, chronic occlusion of the mid circumflex with first OM branch collateralized from the LAD and right coronary artery, moderate stenosis of large ramus intermedius branch, diffuse nonobstructive stenosis of the LAD.  Echo in 2018 showed EF of 45 to 50%.  Echo in 2019 showed EF of 55 to 60%.   Patient has been followed by Dr. Rodolfo Choi over the years.Echo 12/2022 showed LVEF 50-55%, no WMA, mild LVH, normal RVSF, mild MR, mild AI.   The patient was last seen 11/18/23 reporting SOB exertion. Cardiac PET stress test was ordered. Cardiac PET stress was abnormal showing prior infarction and per-infarct ischemia, high risk study. Echo showed LVEF 55-60%, no WMA, G1DD, mild to mod MR, mild calcification of the aortic valve.   Today, echo and cardiac PET were reviewed. The patient reports intermittent SOB. He denies chest pain, has not used NTG. [name] would like to see Dr. Arlester Choi or other interventionalist here at [facility] to further discuss.   Studies Reviewed      Cardiac PET stress 12/2023    LV perfusion is abnormal. Defect 1: There is a medium defect with severe reduction in uptake present in the apical to basal anterolateral, inferolateral and lateral location(s) that is partially reversible. There is abnormal wall motion in the defect area. Consistent with infarction and peri-infarct ischemia.   Rest left ventricular function is abnormal. Rest global function is moderately reduced. Rest EF: 42%. Stress left ventricular function is abnormal. Stress  global function is moderately reduced. Stress EF: 39%. End diastolic cavity size is normal.   Myocardial blood flow was computed to be 1.53ml/g/min at rest and 1.21ml/g/min at stress. Global myocardial blood flow reserve was 1.34 and was abnormal.   Coronary calcium  assessment not performed due to prior revascularization.   Findings are consistent with infarction with peri-infarct ischemia. The study is high risk.   Electronically signed by: Paul Crisp, MD.    Echo 12/2023 1. Left ventricular ejection fraction, by estimation, is 55 to 60%. The  left ventricle has normal function. The left ventricle has no regional  wall motion abnormalities. Left ventricular diastolic parameters are  consistent with Grade I diastolic  dysfunction (impaired relaxation).   2. Right ventricular systolic function is normal. The right ventricular  size is normal. Tricuspid regurgitation signal is inadequate for assessing  PA pressure.   3. The mitral valve is normal in structure. Mild to moderate mitral valve  regurgitation. No evidence of mitral stenosis.   4. The aortic valve is bicuspid. There is mild calcification of the  aortic valve. Aortic valve regurgitation is mild. Aortic valve  sclerosis/calcification is present, without any evidence of aortic  stenosis.   5. The inferior vena cava is normal in size with greater than 50%  respiratory variability, suggesting right atrial pressure of 3 mmHg.   Echo 12/2022  1. Left ventricular ejection fraction, by estimation, is 50 to 55%. Left  ventricular ejection fraction by 2D MOD biplane is 51.5 %. The left  ventricle has low normal  function. The left ventricle has no regional wall  motion abnormalities. There is mild  left ventricular hypertrophy of the basal-septal segment. Left ventricular  diastolic parameters were normal.   2. Right ventricular systolic function is normal. The right ventricular  size is normal.   3. The mitral valve is normal in  structure. Mild mitral valve  regurgitation.   4. The aortic valve is calcified. Aortic valve regurgitation is mild.  Aortic valve sclerosis/calcification is present, without any evidence of  aortic stenosis.   5. Aortic dilatation noted. There is mild dilatation of the aortic root  and of the ascending aorta, measuring 41 mm.   6. The inferior vena cava is normal in size with greater than 50%  respiratory variability, suggesting right atrial pressure of 3 mmHg.    Echocardiogram 12/27/16 EF 50, normal wall motion, grade 1 diastolic dysfunction, functionally bicuspid aortic valve (mean 6, peak 12), dilated aortic root (40 mm), dilated ascending aorta (41 mm), mild MR, mild BAE   Carotid US  4/18 Bilateral <50   Cardiac Catheterization 05/04/16 LAD proximal 30, mid 30 RI 60 LCx proximal 100; OM1, OM2 and lateral marginal 2 all filled by collaterals from first septal perforator RCA proximal 95, mid 40 PCI: 3.5 x 16 mm Promus Premier DES to the proximal RCA    Nuclear stress test 04/11/16 Lateral and inferolateral defect predominantly fixed with small area of peri-infarct ischemia, EF 42, moderate to high risk scan   Coronary CTA 02/29/16 IMPRESSION: 1. Anatomically tricuspid, but functionally bicuspid aortic valve with co-joined left and right aortic leaflet and minimally restricted leaflet opening with no evidence of stenosis. 2. Mildly dilated aortic root with maximum diameter 40 mm. 3. 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 An annual follow up with CTA or MRA is recommended. No prior study is available for comparison. 4. Coronary calcium  score of 750. This was 88 percentile for age and sex matched control. 5. Diffuse mild to moderate CAD, with a severe mixed plaque in the proximal RCA associated with 70-99% stenosis. A stress test is Recommended.   Echocardiogram 02/10/16 EF 55-60, grade 1 diastolic dysfunction, trivial AI, AV gradient mean  6/peak 12, mildly dilated ascending aorta (41 mm), mild dilated aortic root (40 mm), normal RVSF   Holter 6/17 PVC burden 6.3      Physical Exam VS:  BP (!) 140/60 (BP Location: Left Arm, Patient Position: Sitting, Cuff Size: Normal)   Pulse 66   Ht 6' (1.829 m)   Wt 182 lb (82.6 kg)   SpO2 98%   BMI 24.68 kg/m    Wt Readings from Last 3 Encounters:  01/21/24 182 lb (82.6 kg)  01/02/24 182 lb (82.6 kg)  11/18/23 188 lb 9.6 oz (85.5 kg)    GEN: Well nourished, well developed in no acute distress NECK: No JVD; No carotid bruits CARDIAC: RRR, no murmurs, rubs, gallops RESPIRATORY:  Clear to auscultation without rales, wheezing or rhonchi  ABDOMEN: Soft, non-tender, non-distended EXTREMITIES:  No edema; No deformity   ASSESSMENT AND PLAN  SOB/Unstable Angina CAD s/p DES RCA in 2017 Cardiac PET stress test showed infarction with per-infarct ischemia, EF42%, high risk study (report above). Echo showed LVEF 55-60%. Patient reports persistent SOB, which could be angina equivalent. He denies chest pain. He would like to see Dr. Arlester Choi to discuss cardiac cath. I let him know I can set him up for cath and there are two internationalists at [facility] as well. We  will try and get him in with [facility].   Palpitations/PVCs Remote heart monitor showed PVC burden of 6.3%. No palpitations reported.  He is not on a BB at baseline.   HFimpEF Echo in 2018 showed LVEF 45-50%. Repeat echo in 2019 showed normalization of EF. Most recent echo showed LVEF 55-60%, G1DD.   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 Continue Lipitor 80mg  daily.      Dispo: Follow-up in 1-3 months with Dr. Arlester Choi  Signed, Jeannine Pennisi Rebekah Canada, PA-C

## 2024-03-11 ENCOUNTER — Encounter: Payer: Self-pay | Admitting: Internal Medicine

## 2024-03-11 ENCOUNTER — Ambulatory Visit: Attending: Internal Medicine | Admitting: Internal Medicine

## 2024-03-11 VITALS — BP 130/79 | HR 79 | Ht 72.0 in | Wt 181.8 lb

## 2024-03-11 DIAGNOSIS — N1832 Chronic kidney disease, stage 3b: Secondary | ICD-10-CM | POA: Diagnosis present

## 2024-03-11 DIAGNOSIS — R9439 Abnormal result of other cardiovascular function study: Secondary | ICD-10-CM | POA: Insufficient documentation

## 2024-03-11 DIAGNOSIS — E785 Hyperlipidemia, unspecified: Secondary | ICD-10-CM | POA: Diagnosis present

## 2024-03-11 DIAGNOSIS — I493 Ventricular premature depolarization: Secondary | ICD-10-CM | POA: Insufficient documentation

## 2024-03-11 DIAGNOSIS — I10 Essential (primary) hypertension: Secondary | ICD-10-CM | POA: Insufficient documentation

## 2024-03-11 DIAGNOSIS — R0609 Other forms of dyspnea: Secondary | ICD-10-CM | POA: Insufficient documentation

## 2024-03-11 MED ORDER — DIPHENHYDRAMINE HCL 50 MG PO TABS: 50.0000 mg | ORAL_TABLET | Freq: Once | ORAL | 0 refills | Status: DC

## 2024-03-11 MED ORDER — PREDNISONE 50 MG PO TABS
ORAL_TABLET | ORAL | 0 refills | Status: DC
Start: 2024-03-11 — End: 2024-03-13

## 2024-03-11 NOTE — Progress Notes (Signed)
 Cardiology Office Note:  .   Date:  03/11/2024  ID:  Paul Choi, DOB July 25, 1946, MRN [id number] PCP: Paul Ophelia JINNY DOUGLAS, MD  [facility] Providers Cardiologist:  Paul Fell, MD     History of Present Illness: .   Paul Choi is a 78 y.o. male with history of coronary artery disease status post PCI to the RCA in 2017, heart failure with improved ejection fraction, PVCs, and hypertension, who presents for follow-up of coronary artery disease.  He was previously followed in our practice by Paul Choi, having last been in 01/2024 by Paul Fishman, PA.  Earlier in the year, he complained of exertional dyspnea with subsequent myocardial PET/CT demonstrating lateral wall scar and peri-infarct ischemia accompanied by moderately reduced LVEF and diminished global myocardial blood flow reserve consistent with a high risk study.  At his follow-up visit, the patient noted continued shortness of breath but denied chest pain.  He wished to discuss further management options with an interventional cardiologist.  Today, Paul Choi reports that he feels fairly similar to his last visit.  He first developed exertional dyspnea about 3 months ago, which persists.  He denies chest pain as well as palpitations and lightheadedness.  He has mild lower extremity edema, which is chronic and stable.  Home blood pressure readings are similar to today's office measurement.  ROS: See HPI  Studies Reviewed: SABRA   EKG Interpretation Date/Time:  Wednesday March 11 2024 10:57:46 EDT Ventricular Rate:  79 PR Interval:  196 QRS Duration:  92 QT Interval:  374 QTC Calculation: 428 R Axis:   5  Text Interpretation: Sinus rhythm with frequent and consecutive Premature ventricular complexes Cannot rule out Anterior infarct , age undetermined Abnormal ECG When compared with ECG of 18-Nov-2023 10:34, No significant change was found Confirmed by Dazha Kempa, Lonni [id number]) on 03/11/2024 12:53:23 PM    Myocardial  PET/CT (01/02/2024): Abnormal, potentially high risk study with moderate in size, moderate to severe in intensity, partially reversible lateral wall defect consistent with scar and peri-infarct ischemia.  LVEF moderately reduced with lateral wall hypokinesis.  Global myocardial blood flow reserve also diminished at 1.3.  Risk Assessment/Calculations:             Physical Exam:   VS:  BP 130/79   Pulse 79   Ht 6' (1.829 m)   Wt 181 lb 12.8 oz (82.5 kg)   SpO2 98%   BMI 24.66 kg/m    Wt Readings from Last 3 Encounters:  03/11/24 181 lb 12.8 oz (82.5 kg)  01/21/24 182 lb (82.6 kg)  01/02/24 182 lb (82.6 kg)    General:  NAD. Neck: No JVD or HJR. Lungs: Clear to auscultation bilaterally without wheezes or crackles. Heart: Regular rate and rhythm with frequent extrasystoles and 2/6 systolic murmur. Abdomen: Soft, nontender, nondistended. Extremities: No lower extremity edema.  ASSESSMENT AND PLAN: .    Dyspnea on exertion and abnormal stress test: Paul Choi reports exertional dyspnea over the last 3 months.  He is concerned this may represent his anginal equivalent, as he was asymptomatic prior to his catheterization and PCI to the RCA in 2017 and was noted to have significant two-vessel disease with high-grade proximal RCA lesion and CTO of the LCx.  We reviewed the results of his recent myocardial PET/CT, demonstrating predominantly scar with some peri-infarct ischemia involving the lateral wall.  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  Diminished LVEF and myocardial blood flow reserve are  concerning features but could be artifactually low due to frequent PVCs.  We have discussed a trial of additional medical therapy, considering rechallenge with beta-blocker (he did not tolerate this in the past due to joint pains and fatigue) or isosorbide mononitrate, versus proceeding with catheterization.  Paul Choi wishes to move forward with catheterization.  He will need  premedication with prednisone  and diphenhydramine  given his history of IV contrast allergy.  Will also have him come in 4 hours early for prehydration in the setting of his CKD.  We discussed the need for potential staged PCI to help minimize risk for contrast-induced nephropathy if PCI is indicated.  He is in agreement with this.  We will plan to move forward with catheterization later this week at [facility].  In the meantime, I will have him continue his current medications including aspirin  and atorvastatin  for secondary prevention and amlodipine  for blood pressure control and antianginal therapy.  Hypertension: Blood pressure upper normal today.  Continue current doses of amlodipine  and losartan .  Chronic kidney disease stage IIIb: Most recent creatinine was 1.7 with GFR of 40.  As outlined above, we will need Paul Choi to come in early for prehydration in anticipation of catheterization to minimize risk for contrast-induced nephropathy.  Continued current dose of losartan .  Avoid nephrotoxic agents when possible.  Hyperlipidemia: LDL well-controlled on last check through Dr. Celine office in 09/2023 at 55.  Continue atorvastatin  80 mg daily.  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 Fernande.  If his catheterization shows stable CAD, it may be beneficial to repeat an event monitor and have him reestablish with EP in case PVCs are contributing to his exertional dyspnea.    Informed Consent   Shared Decision Making/Informed Consent The risks [stroke (1 in 1000), death (1 in 1000), kidney failure [usually temporary] (1 in 500), bleeding (1 in 200), allergic reaction [possibly serious] (1 in 200)], benefits (diagnostic support and management of coronary artery disease) and alternatives of a cardiac catheterization were discussed in detail with [name] and he is willing to proceed.     Dispo: Return to clinic in 2 weeks.  Signed, Lonni Hanson, MD

## 2024-03-11 NOTE — H&P (View-Only) (Signed)
 Cardiology Office Note:  .   Date:  03/11/2024  ID:  Lynwood JONELLE Inches, DOB July 25, 1946, MRN [id number] PCP: Fernande Ophelia JINNY DOUGLAS, MD  [facility] Providers Cardiologist:  Ozell Fell, MD     History of Present Illness: .   [name] is a 78 y.o. male with history of coronary artery disease status post PCI to the RCA in 2017, heart failure with improved ejection fraction, PVCs, and hypertension, who presents for follow-up of coronary artery disease.  He was previously followed in our practice by Dr. Fell, having last been in 01/2024 by Mikey Fishman, PA.  Earlier in the year, he complained of exertional dyspnea with subsequent myocardial PET/CT demonstrating lateral wall scar and peri-infarct ischemia accompanied by moderately reduced LVEF and diminished global myocardial blood flow reserve consistent with a high risk study.  At his follow-up visit, the patient noted continued shortness of breath but denied chest pain.  He wished to discuss further management options with an interventional cardiologist.  Today, [name] reports that he feels fairly similar to his last visit.  He first developed exertional dyspnea about 3 months ago, which persists.  He denies chest pain as well as palpitations and lightheadedness.  He has mild lower extremity edema, which is chronic and stable.  Home blood pressure readings are similar to today's office measurement.  ROS: See HPI  Studies Reviewed: SABRA   EKG Interpretation Date/Time:  Wednesday March 11 2024 10:57:46 EDT Ventricular Rate:  79 PR Interval:  196 QRS Duration:  92 QT Interval:  374 QTC Calculation: 428 R Axis:   5  Text Interpretation: Sinus rhythm with frequent and consecutive Premature ventricular complexes Cannot rule out Anterior infarct , age undetermined Abnormal ECG When compared with ECG of 18-Nov-2023 10:34, No significant change was found Confirmed by Dazha Kempa, Lonni [id number]) on 03/11/2024 12:53:23 PM    Myocardial  PET/CT (01/02/2024): Abnormal, potentially high risk study with moderate in size, moderate to severe in intensity, partially reversible lateral wall defect consistent with scar and peri-infarct ischemia.  LVEF moderately reduced with lateral wall hypokinesis.  Global myocardial blood flow reserve also diminished at 1.3.  Risk Assessment/Calculations:             Physical Exam:   VS:  BP 130/79   Pulse 79   Ht 6' (1.829 m)   Wt 181 lb 12.8 oz (82.5 kg)   SpO2 98%   BMI 24.66 kg/m    Wt Readings from Last 3 Encounters:  03/11/24 181 lb 12.8 oz (82.5 kg)  01/21/24 182 lb (82.6 kg)  01/02/24 182 lb (82.6 kg)    General:  NAD. Neck: No JVD or HJR. Lungs: Clear to auscultation bilaterally without wheezes or crackles. Heart: Regular rate and rhythm with frequent extrasystoles and 2/6 systolic murmur. Abdomen: Soft, nontender, nondistended. Extremities: No lower extremity edema.  ASSESSMENT AND PLAN: .    Dyspnea on exertion and abnormal stress test: [name] reports exertional dyspnea over the last 3 months.  He is concerned this may represent his anginal equivalent, as he was asymptomatic prior to his catheterization and PCI to the RCA in 2017 and was noted to have significant two-vessel disease with high-grade proximal RCA lesion and CTO of the LCx.  We reviewed the results of his recent myocardial PET/CT, demonstrating predominantly scar with some peri-infarct ischemia involving the lateral wall.  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  Diminished LVEF and myocardial blood flow reserve are  concerning features but could be artifactually low due to frequent PVCs.  We have discussed a trial of additional medical therapy, considering rechallenge with beta-blocker (he did not tolerate this in the past due to joint pains and fatigue) or isosorbide mononitrate, versus proceeding with catheterization.  Mr. Swim wishes to move forward with catheterization.  He will need  premedication with prednisone  and diphenhydramine  given his history of IV contrast allergy.  Will also have him come in 4 hours early for prehydration in the setting of his CKD.  We discussed the need for potential staged PCI to help minimize risk for contrast-induced nephropathy if PCI is indicated.  He is in agreement with this.  We will plan to move forward with catheterization later this week at [facility].  In the meantime, I will have him continue his current medications including aspirin  and atorvastatin  for secondary prevention and amlodipine  for blood pressure control and antianginal therapy.  Hypertension: Blood pressure upper normal today.  Continue current doses of amlodipine  and losartan .  Chronic kidney disease stage IIIb: Most recent creatinine was 1.7 with GFR of 40.  As outlined above, we will need [name] to come in early for prehydration in anticipation of catheterization to minimize risk for contrast-induced nephropathy.  Continued current dose of losartan .  Avoid nephrotoxic agents when possible.  Hyperlipidemia: LDL well-controlled on last check through Dr. Celine office in 09/2023 at 55.  Continue atorvastatin  80 mg daily.  PVCs: [name] has a history of frequent PVCs previously followed by Dr. Fernande.  If his catheterization shows stable CAD, it may be beneficial to repeat an event monitor and have him reestablish with EP in case PVCs are contributing to his exertional dyspnea.    Informed Consent   Shared Decision Making/Informed Consent The risks [stroke (1 in 1000), death (1 in 1000), kidney failure [usually temporary] (1 in 500), bleeding (1 in 200), allergic reaction [possibly serious] (1 in 200)], benefits (diagnostic support and management of coronary artery disease) and alternatives of a cardiac catheterization were discussed in detail with [name] and he is willing to proceed.     Dispo: Return to clinic in 2 weeks.  Signed, Lonni Hanson, MD

## 2024-03-11 NOTE — H&P (View-Only) (Signed)
 Cardiology Office Note:  .   Date:  03/11/2024  ID:  Paul Choi, DOB July 25, 1946, MRN [id number] PCP: Fernande Ophelia JINNY DOUGLAS, MD  [facility] Providers Cardiologist:  Ozell Fell, MD     History of Present Illness: .   Paul Choi is a 78 y.o. male with history of coronary artery disease status post PCI to the RCA in 2017, heart failure with improved ejection fraction, PVCs, and hypertension, who presents for follow-up of coronary artery disease.  He was previously followed in our practice by Dr. Fell, having last been in 01/2024 by Mikey Fishman, PA.  Earlier in the year, he complained of exertional dyspnea with subsequent myocardial PET/CT demonstrating lateral wall scar and peri-infarct ischemia accompanied by moderately reduced LVEF and diminished global myocardial blood flow reserve consistent with a high risk study.  At his follow-up visit, the patient noted continued shortness of breath but denied chest pain.  He wished to discuss further management options with an interventional cardiologist.  Today, Paul Choi reports that he feels fairly similar to his last visit.  He first developed exertional dyspnea about 3 months ago, which persists.  He denies chest pain as well as palpitations and lightheadedness.  He has mild lower extremity edema, which is chronic and stable.  Home blood pressure readings are similar to today's office measurement.  ROS: See HPI  Studies Reviewed: SABRA   EKG Interpretation Date/Time:  Wednesday March 11 2024 10:57:46 EDT Ventricular Rate:  79 PR Interval:  196 QRS Duration:  92 QT Interval:  374 QTC Calculation: 428 R Axis:   5  Text Interpretation: Sinus rhythm with frequent and consecutive Premature ventricular complexes Cannot rule out Anterior infarct , age undetermined Abnormal ECG When compared with ECG of 18-Nov-2023 10:34, No significant change was found Confirmed by Dazha Kempa, Lonni [id number]) on 03/11/2024 12:53:23 PM    Myocardial  PET/CT (01/02/2024): Abnormal, potentially high risk study with moderate in size, moderate to severe in intensity, partially reversible lateral wall defect consistent with scar and peri-infarct ischemia.  LVEF moderately reduced with lateral wall hypokinesis.  Global myocardial blood flow reserve also diminished at 1.3.  Risk Assessment/Calculations:             Physical Exam:   VS:  BP 130/79   Pulse 79   Ht 6' (1.829 m)   Wt 181 lb 12.8 oz (82.5 kg)   SpO2 98%   BMI 24.66 kg/m    Wt Readings from Last 3 Encounters:  03/11/24 181 lb 12.8 oz (82.5 kg)  01/21/24 182 lb (82.6 kg)  01/02/24 182 lb (82.6 kg)    General:  NAD. Neck: No JVD or HJR. Lungs: Clear to auscultation bilaterally without wheezes or crackles. Heart: Regular rate and rhythm with frequent extrasystoles and 2/6 systolic murmur. Abdomen: Soft, nontender, nondistended. Extremities: No lower extremity edema.  ASSESSMENT AND PLAN: .    Dyspnea on exertion and abnormal stress test: Paul Choi reports exertional dyspnea over the last 3 months.  He is concerned this may represent his anginal equivalent, as he was asymptomatic prior to his catheterization and PCI to the RCA in 2017 and was noted to have significant two-vessel disease with high-grade proximal RCA lesion and CTO of the LCx.  We reviewed the results of his recent myocardial PET/CT, demonstrating predominantly scar with some peri-infarct ischemia involving the lateral wall.  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  Diminished LVEF and myocardial blood flow reserve are  concerning features but could be artifactually low due to frequent PVCs.  We have discussed a trial of additional medical therapy, considering rechallenge with beta-blocker (he did not tolerate this in the past due to joint pains and fatigue) or isosorbide mononitrate, versus proceeding with catheterization.  Paul Choi wishes to move forward with catheterization.  He will need  premedication with prednisone  and diphenhydramine  given his history of IV contrast allergy.  Will also have him come in 4 hours early for prehydration in the setting of his CKD.  We discussed the need for potential staged PCI to help minimize risk for contrast-induced nephropathy if PCI is indicated.  He is in agreement with this.  We will plan to move forward with catheterization later this week at [facility].  In the meantime, I will have him continue his current medications including aspirin  and atorvastatin  for secondary prevention and amlodipine  for blood pressure control and antianginal therapy.  Hypertension: Blood pressure upper normal today.  Continue current doses of amlodipine  and losartan .  Chronic kidney disease stage IIIb: Most recent creatinine was 1.7 with GFR of 40.  As outlined above, we will need Paul Choi to come in early for prehydration in anticipation of catheterization to minimize risk for contrast-induced nephropathy.  Continued current dose of losartan .  Avoid nephrotoxic agents when possible.  Hyperlipidemia: LDL well-controlled on last check through Dr. Celine office in 09/2023 at 55.  Continue atorvastatin  80 mg daily.  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 Fernande.  If his catheterization shows stable CAD, it may be beneficial to repeat an event monitor and have him reestablish with EP in case PVCs are contributing to his exertional dyspnea.    Informed Consent   Shared Decision Making/Informed Consent The risks [stroke (1 in 1000), death (1 in 1000), kidney failure [usually temporary] (1 in 500), bleeding (1 in 200), allergic reaction [possibly serious] (1 in 200)], benefits (diagnostic support and management of coronary artery disease) and alternatives of a cardiac catheterization were discussed in detail with Paul Choi and he is willing to proceed.     Dispo: Return to clinic in 2 weeks.  Signed, Lonni Hanson, MD

## 2024-03-11 NOTE — Patient Instructions (Addendum)
 Medication Instructions:  Your physician recommends that you continue on your current medications as directed. Please refer to the Current Medication list given to you today.    *If you need a refill on your cardiac medications before your next appointment, please call your pharmacy*  Lab Work: Your provider would like for you to have following labs drawn today CBC, BMP.     Testing/Procedures: Your physician has requested that you have a cardiac catheterization. Cardiac catheterization is used to diagnose and/or treat various heart conditions. Doctors may recommend this procedure for a number of different reasons. The most common reason is to evaluate chest pain. Chest pain can be a symptom of coronary artery disease (CAD), and cardiac catheterization can show whether plaque is narrowing or blocking your heart's arteries. This procedure is also used to evaluate the valves, as well as measure the blood flow and oxygen levels in different parts of your heart. For further information please visit [email or web address] Please follow instruction sheet, as given. Please see instructions below   Follow-Up: At [facility], you and your health needs are our priority.  As part of our continuing mission to provide you with exceptional heart care, our providers are all part of one team.  This team includes your primary Cardiologist (physician) and Advanced Practice Providers or APPs (Physician Assistants and Nurse Practitioners) who all work together to provide you with the care you need, when you need it.  Your next appointment:   2 week(s)   [facility] A DEPT OF [facility] AT [facility] [address] [place] [address] Dept: [phone] Loc: [phone]  COWAN PILAR  03/11/2024  You are scheduled for a Cardiac Catheterization on Friday, July 25 with Dr. Lonni End.  1. Please arrive at the [facility]ascular Center Entrance of [facility], [address], [place] [address] at 7:30 AM (This is 4 hour(s) prior to your procedure time).  Proceed to the Check-In Desk directly inside the entrance.  Procedure Parking: Use the entrance off of the [address] side of the hospital. Turn right upon entering and follow the driveway to parking that is directly in front of the Heart & Vascular Center. There is no valet parking available at this entrance, however there is an awning directly in front of the Heart & Vascular Center for drop off/ pick up for patients.  Special note: Every effort is made to have your procedure done on time. Please understand that emergencies sometimes delay scheduled procedures.  2. Diet: Do not eat solid foods after midnight.  The patient may have clear liquids until 5am upon the day of the procedure.  3. Labs: You will need to have blood drawn today (CBC, BMP)  4. Medication instructions in preparation for your procedure:   Contrast Allergy: Yes,   Please take Prednisone  50mg  by mouth at: Thirteen hours prior to cath 03/12/24 at 10:30 pm Seven hours prior to cath 03/13/24 at 4:30 am last dose of Prednisone  50mg  and Benadryl  50mg  by mouth 1 hour prior to procedure: 03/13/24 at 10:30 am   On the morning of your procedure, take your Aspirin  81 mg and any morning medicines NOT listed above.  You may use sips of water.  5. Plan to go home the same day, you will only stay overnight if medically necessary. 6. Bring a current list of your medications and current insurance cards. 7. You MUST have a responsible person  to drive you home. 8. Someone MUST be with you the first 24 hours after you arrive home or your discharge will be delayed. 9. Please wear clothes that are easy to get on and off and wear slip-on shoes.  Thank you for allowing us  to care for you!   -- [facility]  Provider:   You may see Lonni Hanson, MD or one of the  following Advanced Practice Providers on your designated Care Team:   Lonni Meager, NP Lesley Maffucci, PA-C Bernardino Bring, PA-C Cadence Chokio, PA-C Tylene Lunch, NP Barnie Hila, NP

## 2024-03-12 ENCOUNTER — Ambulatory Visit: Payer: Self-pay | Admitting: Internal Medicine

## 2024-03-12 LAB — CBC
Hematocrit: 36.6 % — ABNORMAL LOW (ref 37.5–51.0)
Hemoglobin: 12 g/dL — ABNORMAL LOW (ref 13.0–17.7)
MCH: 30.5 pg (ref 26.6–33.0)
MCHC: 32.8 g/dL (ref 31.5–35.7)
MCV: 93 fL (ref 79–97)
Platelets: 255 x10E3/uL (ref 150–450)
RBC: 3.94 x10E6/uL — ABNORMAL LOW (ref 4.14–5.80)
RDW: 14.1 % (ref 11.6–15.4)
WBC: 6.7 x10E3/uL (ref 3.4–10.8)

## 2024-03-12 LAB — BASIC METABOLIC PANEL WITH GFR
BUN/Creatinine Ratio: 16 (ref 10–24)
BUN: 22 mg/dL (ref 8–27)
CO2: 20 mmol/L (ref 20–29)
Calcium: 9.6 mg/dL (ref 8.6–10.2)
Chloride: 100 mmol/L (ref 96–106)
Creatinine, Ser: 1.38 mg/dL — ABNORMAL HIGH (ref 0.76–1.27)
Glucose: 117 mg/dL — ABNORMAL HIGH (ref 70–99)
Potassium: 5.2 mmol/L (ref 3.5–5.2)
Sodium: 136 mmol/L (ref 134–144)
eGFR: 52 mL/min/1.73 — ABNORMAL LOW (ref >59)

## 2024-03-13 ENCOUNTER — Other Ambulatory Visit: Payer: Self-pay | Admitting: Internal Medicine

## 2024-03-13 ENCOUNTER — Other Ambulatory Visit: Payer: Self-pay

## 2024-03-13 ENCOUNTER — Encounter
Admission: RE | Disposition: A | Discharge status: Home / Self Care | Payer: Self-pay | Source: Home / Self Care | Attending: Internal Medicine

## 2024-03-13 ENCOUNTER — Ambulatory Visit
Admission: RE | Admit: 2024-03-13 | Discharge: 2024-03-13 | Disposition: A | Discharge status: Home / Self Care | Attending: Internal Medicine | Admitting: Internal Medicine

## 2024-03-13 ENCOUNTER — Encounter: Payer: Self-pay | Admitting: Internal Medicine

## 2024-03-13 ENCOUNTER — Telehealth: Payer: Self-pay

## 2024-03-13 DIAGNOSIS — N1832 Chronic kidney disease, stage 3b: Secondary | ICD-10-CM | POA: Diagnosis not present

## 2024-03-13 DIAGNOSIS — R0609 Other forms of dyspnea: Secondary | ICD-10-CM | POA: Diagnosis present

## 2024-03-13 DIAGNOSIS — R9439 Abnormal result of other cardiovascular function study: Secondary | ICD-10-CM

## 2024-03-13 DIAGNOSIS — Z539 Procedure and treatment not carried out, unspecified reason: Secondary | ICD-10-CM | POA: Diagnosis not present

## 2024-03-13 DIAGNOSIS — E785 Hyperlipidemia, unspecified: Secondary | ICD-10-CM | POA: Diagnosis not present

## 2024-03-13 DIAGNOSIS — Z79899 Other long term (current) drug therapy: Secondary | ICD-10-CM | POA: Diagnosis not present

## 2024-03-13 DIAGNOSIS — I493 Ventricular premature depolarization: Secondary | ICD-10-CM | POA: Insufficient documentation

## 2024-03-13 DIAGNOSIS — I130 Hypertensive heart and chronic kidney disease with heart failure and stage 1 through stage 4 chronic kidney disease, or unspecified chronic kidney disease: Secondary | ICD-10-CM | POA: Insufficient documentation

## 2024-03-13 DIAGNOSIS — I509 Heart failure, unspecified: Secondary | ICD-10-CM | POA: Insufficient documentation

## 2024-03-13 LAB — GLUCOSE, CAPILLARY: Glucose-Capillary: 191 mg/dL — ABNORMAL HIGH (ref 70–99)

## 2024-03-13 SURGERY — LEFT HEART CATH AND CORONARY ANGIOGRAPHY
Anesthesia: Moderate Sedation | Laterality: Left

## 2024-03-13 MED ORDER — SODIUM CHLORIDE 0.9 % WEIGHT BASED INFUSION: 3.0000 mL/kg/h | INTRAVENOUS | Status: DC

## 2024-03-13 MED ORDER — PREDNISONE 50 MG PO TABS
ORAL_TABLET | ORAL | 0 refills | Status: DC
Start: 2024-03-13 — End: 2024-03-17

## 2024-03-13 MED ORDER — SODIUM CHLORIDE 0.9 % WEIGHT BASED INFUSION: 1.0000 mL/kg/h | INTRAVENOUS | Status: DC

## 2024-03-13 MED ORDER — ASPIRIN 81 MG PO CHEW: 81.0000 mg | CHEWABLE_TABLET | ORAL | Status: DC

## 2024-03-13 MED ORDER — DIPHENHYDRAMINE HCL 50 MG PO TABS: 50.0000 mg | ORAL_TABLET | Freq: Once | ORAL | 0 refills | Status: DC

## 2024-03-13 MED ORDER — SODIUM CHLORIDE 0.9 % WEIGHT BASED INFUSION
3.0000 mL/kg/h | INTRAVENOUS | Status: AC
  Administered 2024-03-13: 3 mL/kg/h via INTRAVENOUS

## 2024-03-13 NOTE — Progress Notes (Signed)
 [facility] Interventional Cardiology Note  Date: 03/13/24  Time: 3:42 PM  Mr. Shaneyfelt presented this morning for prehydration and catheterization for evaluation of shortness of breath with exertion and abnormal stress test.  Due to multiple STEMI activations, catheterization start time was delayed and it is no longer feasible to proceed with elective catheterization today.  I have spoken with Mr. Borchardt and his family and we have agreed to reschedule his catheterization for next week (03/17/2024) with Dr. Darron.  He should continue his current medications.  He will arrive 3 hours early for prehydration again given his CKD stage III and premedicate with prednisone  and diphenhydramine  per protocol (new prescription to be sent in by our office).  I advised him to contact us  or seek immediate medical attention if he develops any new symptoms in the meantime.  He is agreeable with this plan.  Lonni Hanson, MD [facility]

## 2024-03-13 NOTE — Telephone Encounter (Signed)
 Per Dr. Mady, pt rescheduled for cath on Tuesday 729/25 at 10:30 am and will need to arrive by 7:30 am for pre-hydration. He reported pt will also need new order for contrast premedication.  Order placed and will contact pt on Monday 03/16/24 to provide instructions.

## 2024-03-13 NOTE — Progress Notes (Signed)
 Dr. Mady came to bedside to talk to pt about rescheduled procedure due to emergency cases in the cath lab delaying start time. Pt agreed to reschedule.

## 2024-03-13 NOTE — Interval H&P Note (Signed)
 History and Physical Interval Note:  03/13/2024 10:40 AM  Paul Choi  has presented today for surgery, with the diagnosis of dyspnea on exertion and anormal stress test.  The various methods of treatment have been discussed with the patient and family. After consideration of risks, benefits and other options for treatment, the patient has consented to  Procedure(s): LEFT HEART CATH AND CORONARY ANGIOGRAPHY (Left) as a surgical intervention.  The patient's history has been reviewed, patient examined, no change in status, stable for surgery.  I have reviewed the patient's chart and labs.  Questions were answered to the patient's satisfaction.    Cath Lab Visit (complete for each Cath Lab visit)  Clinical Evaluation Leading to the Procedure:   ACS: No.  Non-ACS:    Anginal Classification: CCS III  Anti-ischemic medical therapy: Minimal Therapy (1 class of medications)  Non-Invasive Test Results: High-risk stress test findings: cardiac mortality >3%/year  Prior CABG: No previous CABG  Paul Choi

## 2024-03-13 NOTE — Progress Notes (Signed)
 Patient brought prednisone  and benadryl  from home and took the doses at 1030.

## 2024-03-13 NOTE — Progress Notes (Signed)
   03/13/24 1245  Spiritual Encounters  Type of Visit Initial  Care provided to: [facility] partners present during encounter Nurse  Reason for visit Routine spiritual support  OnCall Visit Yes   Chaplain greeted patient's family who was in the Cath Lab waiting room.  Family wanted to have information/update about the patient.  Chaplain checked with staff who shared the patient was still waiting for his scheduled procedure.  Staff shared they'd check in with the family and Chaplain shared this information with the family.  Family said they had no other needs at this time.    Rev. Rana M. Nicholaus, M.Div. Chaplain Resident [facility]

## 2024-03-16 ENCOUNTER — Telehealth: Payer: Self-pay | Admitting: *Deleted

## 2024-03-16 NOTE — Telephone Encounter (Signed)
 Pt present to office regarding phone call. Procedure instructions reviewed and given to pt. Pt verbalized understanding

## 2024-03-16 NOTE — Telephone Encounter (Signed)
 Called to confirm/remind patient of their procedure.   Scheduled for: Left heart cath  [x]  Date 03/17/24  [x]  Time 10:30 am [x]  Arrival time 07:30 am [x]  Location [facility]   [x]  Designated Driver  [x]  Instructions, time, and location reviewed with patient    [x]  H&P within 30 days  [x]  EKG within 30 days  [x]  Orders  [x]  Labs  [x]  GFR 52  [x]  Diet  [x]  Medication instructions Reviewed [x]  Contrast allergy   [x]  Spoke with patient and reviewed all information []  VM Full/unable to leave message  []  Phone not in service

## 2024-03-16 NOTE — Telephone Encounter (Signed)
 Attempted to contact pt to go over the following cath instructions.  Left message to call back.   [facility] A DEPT OF [place]. [facility] AT [facility] [address] [employer] [place] [address] Dept: [phone] Loc: [phone]  Paul Choi  03/16/2024  You are scheduled for a Cardiac Catheterization on Tuesday, July 29 with Dr. Deatrice Cage.  1. Please arrive at the [facility]ascular Center Entrance of [facility], [address], [place] [address] at 7:30 AM (This is 3 hour(s) prior to your procedure time for pre-hydration).  Proceed to the Check-In Desk directly inside the entrance.   Procedure Parking: Use the entrance off of the [address] side of the hospital. Turn right upon entering and follow the driveway to parking that is directly in front of the Heart & Vascular Center. There is no valet parking available at this entrance, however there is an awning directly in front of the Heart & Vascular Center for drop off/ pick up for patients.  Special note: Every effort is made to have your procedure done on time. Please understand that emergencies sometimes delay scheduled procedures.  2. Diet: Do not eat solid foods after midnight.  The patient may have clear liquids until 5am upon the day of the procedure.  3. Labs: Completed on 03/11/24  4. Medication instructions in preparation for your procedure:   Contrast Allergy: Yes, Please take Prednisone  50mg  by mouth at: Thirteen hours prior to cath (9:30 pm on Monday 03/16/24) Seven hours prior to cath (3:30 am on Tuesday 03/17/24) And bring last dose of Prednisone  50mg  and Benadryl  50mg  with you to take at 9:30 am .  On the morning of your procedure, take your Aspirin  81 mg and any morning medicines NOT listed above.  You may use sips of water.  5. Plan to go home the same day, you will only stay overnight if medically necessary. 6. Bring a current list of your  medications and current insurance cards. 7. You MUST have a responsible person to drive you home. 8. Someone MUST be with you the first 24 hours after you arrive home or your discharge will be delayed. 9. Please wear clothes that are easy to get on and off and wear slip-on shoes.  Thank you for allowing us  to care for you!   -- [place] [facility]

## 2024-03-16 NOTE — Telephone Encounter (Signed)
 Patient requested that I call pharmacy to make sure his medication was ready. Called pharmacy and they have had it ready for 4 days. Will send patient my chart message with instructions to pick it up.

## 2024-03-17 ENCOUNTER — Encounter
Admission: RE | Disposition: A | Discharge status: Home / Self Care | Payer: Self-pay | Source: Home / Self Care | Attending: Cardiovascular Disease

## 2024-03-17 ENCOUNTER — Ambulatory Visit: Admit: 2024-03-17 | Admitting: Cardiovascular Disease

## 2024-03-17 ENCOUNTER — Encounter: Payer: Self-pay | Admitting: Cardiovascular Disease

## 2024-03-17 ENCOUNTER — Ambulatory Visit
Admission: RE | Admit: 2024-03-17 | Discharge: 2024-03-17 | Disposition: A | Discharge status: Home / Self Care | Attending: Cardiovascular Disease | Admitting: Cardiovascular Disease

## 2024-03-17 DIAGNOSIS — I493 Ventricular premature depolarization: Secondary | ICD-10-CM | POA: Diagnosis not present

## 2024-03-17 DIAGNOSIS — Z7982 Long term (current) use of aspirin: Secondary | ICD-10-CM | POA: Diagnosis not present

## 2024-03-17 DIAGNOSIS — N1832 Chronic kidney disease, stage 3b: Secondary | ICD-10-CM | POA: Insufficient documentation

## 2024-03-17 DIAGNOSIS — I130 Hypertensive heart and chronic kidney disease with heart failure and stage 1 through stage 4 chronic kidney disease, or unspecified chronic kidney disease: Secondary | ICD-10-CM | POA: Insufficient documentation

## 2024-03-17 DIAGNOSIS — R0609 Other forms of dyspnea: Secondary | ICD-10-CM | POA: Diagnosis present

## 2024-03-17 DIAGNOSIS — I2510 Atherosclerotic heart disease of native coronary artery without angina pectoris: Secondary | ICD-10-CM | POA: Insufficient documentation

## 2024-03-17 DIAGNOSIS — I509 Heart failure, unspecified: Secondary | ICD-10-CM | POA: Diagnosis not present

## 2024-03-17 DIAGNOSIS — Z79899 Other long term (current) drug therapy: Secondary | ICD-10-CM | POA: Diagnosis not present

## 2024-03-17 DIAGNOSIS — E785 Hyperlipidemia, unspecified: Secondary | ICD-10-CM | POA: Insufficient documentation

## 2024-03-17 DIAGNOSIS — I2582 Chronic total occlusion of coronary artery: Secondary | ICD-10-CM | POA: Diagnosis not present

## 2024-03-17 DIAGNOSIS — Z91041 Radiographic dye allergy status: Secondary | ICD-10-CM | POA: Insufficient documentation

## 2024-03-17 DIAGNOSIS — R9439 Abnormal result of other cardiovascular function study: Secondary | ICD-10-CM

## 2024-03-17 DIAGNOSIS — Z955 Presence of coronary angioplasty implant and graft: Secondary | ICD-10-CM | POA: Insufficient documentation

## 2024-03-17 HISTORY — PX: CORONARY ANGIOGRAPHY: CATH118303

## 2024-03-17 SURGERY — LEFT HEART CATH AND CORONARY ANGIOGRAPHY
Anesthesia: Moderate Sedation | Laterality: Left

## 2024-03-17 SURGERY — CORONARY ANGIOGRAPHY (CATH LAB)
Anesthesia: Moderate Sedation | Laterality: Left

## 2024-03-17 MED ORDER — ASPIRIN 81 MG PO CHEW: 81.0000 mg | CHEWABLE_TABLET | ORAL | Status: DC

## 2024-03-17 MED ORDER — FENTANYL CITRATE (PF) 100 MCG/2ML IJ SOLN
INTRAMUSCULAR | Status: DC | PRN
  Administered 2024-03-17: 25 ug via INTRAVENOUS

## 2024-03-17 MED ORDER — HEPARIN SODIUM (PORCINE) 1000 UNIT/ML IJ SOLN
INTRAMUSCULAR | Status: DC | PRN
  Administered 2024-03-17: 4000 [IU] via INTRAVENOUS

## 2024-03-17 MED ORDER — SODIUM CHLORIDE 0.9% FLUSH: 3.0000 mL | Freq: Two times a day (BID) | INTRAVENOUS | Status: DC

## 2024-03-17 MED ORDER — VERAPAMIL HCL 2.5 MG/ML IV SOLN
INTRAVENOUS | Status: DC | PRN
  Administered 2024-03-17: 2.5 mg via INTRA_ARTERIAL

## 2024-03-17 MED ORDER — SODIUM CHLORIDE 0.9% FLUSH: 3.0000 mL | INTRAVENOUS | Status: DC | PRN

## 2024-03-17 MED ORDER — HEPARIN (PORCINE) IN NACL 1000-0.9 UT/500ML-% IV SOLN
INTRAVENOUS | Status: DC | PRN
  Administered 2024-03-17: 1000 mL

## 2024-03-17 MED ORDER — ONDANSETRON HCL 4 MG/2ML IJ SOLN: 4.0000 mg | Freq: Four times a day (QID) | INTRAMUSCULAR | Status: DC | PRN

## 2024-03-17 MED ORDER — ACETAMINOPHEN 325 MG PO TABS
650.0000 mg | ORAL_TABLET | ORAL | Status: DC | PRN
Start: 2024-03-17 — End: 2024-03-17

## 2024-03-17 MED ORDER — LIDOCAINE HCL 1 % IJ SOLN
INTRAMUSCULAR | Status: AC
  Filled 2024-03-17: qty 20

## 2024-03-17 MED ORDER — SODIUM CHLORIDE 0.9 % IV SOLN: 250.0000 mL | INTRAVENOUS | Status: DC | PRN

## 2024-03-17 MED ORDER — IOHEXOL 300 MG/ML  SOLN
INTRAMUSCULAR | Status: DC | PRN
  Administered 2024-03-17: 32 mL

## 2024-03-17 MED ORDER — SODIUM CHLORIDE 0.9 % IV SOLN
INTRAVENOUS | Status: DC
  Administered 2024-03-17: 500 mL via INTRAVENOUS

## 2024-03-17 MED ORDER — VERAPAMIL HCL 2.5 MG/ML IV SOLN
INTRAVENOUS | Status: AC
  Filled 2024-03-17: qty 2

## 2024-03-17 MED ORDER — MIDAZOLAM HCL 2 MG/2ML IJ SOLN
INTRAMUSCULAR | Status: AC
  Filled 2024-03-17: qty 2

## 2024-03-17 MED ORDER — MIDAZOLAM HCL 2 MG/2ML IJ SOLN
INTRAMUSCULAR | Status: DC | PRN
  Administered 2024-03-17: 1 mg via INTRAVENOUS

## 2024-03-17 MED ORDER — HEPARIN (PORCINE) IN NACL 1000-0.9 UT/500ML-% IV SOLN
INTRAVENOUS | Status: AC
  Filled 2024-03-17: qty 1000

## 2024-03-17 MED ORDER — SODIUM CHLORIDE 0.9 % IV SOLN: INTRAVENOUS | Status: DC

## 2024-03-17 MED ORDER — FENTANYL CITRATE (PF) 100 MCG/2ML IJ SOLN
INTRAMUSCULAR | Status: AC
  Filled 2024-03-17: qty 2

## 2024-03-17 MED ORDER — LIDOCAINE HCL (PF) 1 % IJ SOLN
INTRAMUSCULAR | Status: DC | PRN
  Administered 2024-03-17: 2 mL

## 2024-03-17 MED ORDER — HEPARIN SODIUM (PORCINE) 1000 UNIT/ML IJ SOLN
INTRAMUSCULAR | Status: AC
  Filled 2024-03-17: qty 10

## 2024-03-17 SURGICAL SUPPLY — 8 items
CATH INFINITI AMBI 5FR JK (CATHETERS) IMPLANT
DEVICE RAD TR BAND REGULAR (VASCULAR PRODUCTS) IMPLANT
DRAPE BRACHIAL (DRAPES) IMPLANT
GLIDESHEATH SLEND SS 6F .021 (SHEATH) IMPLANT
GUIDEWIRE INQWIRE 1.5J.035X260 (WIRE) IMPLANT
PACK CARDIAC CATH (CUSTOM PROCEDURE TRAY) ×2 IMPLANT
SET ATX-X65L (MISCELLANEOUS) IMPLANT
STATION PROTECTION PRESSURIZED (MISCELLANEOUS) IMPLANT

## 2024-03-17 NOTE — Interval H&P Note (Signed)
 History and Physical Interval Note:  03/17/2024 11:19 AM  Paul Choi  has presented today for surgery, with the diagnosis of L Cath   Dyspnea on exertion    Abnormal stress test.  The various methods of treatment have been discussed with the patient and family. After consideration of risks, benefits and other options for treatment, the patient has consented to  Procedure(s): LEFT HEART CATH AND CORONARY ANGIOGRAPHY (Left) as a surgical intervention.  The patient's history has been reviewed, patient examined, no change in status, stable for surgery.  I have reviewed the patient's chart and labs.  Questions were answered to the patient's satisfaction.     Sagrario Lineberry

## 2024-03-17 NOTE — Progress Notes (Signed)
 Patient brought home pre-meds to hospital. Took prednisone  50mg  and benadryl  50mg  oral.

## 2024-03-24 ENCOUNTER — Telehealth: Payer: Self-pay | Admitting: Cardiovascular Disease

## 2024-03-24 NOTE — Telephone Encounter (Signed)
 Patient says he is returning a missed call from [place], Charity fundraiser. Please advise.

## 2024-03-24 NOTE — Telephone Encounter (Signed)
 Called patient, did not see anything new from [place], I did see upcoming appointment on 08/08, this may have been a reminder call for this upcoming appointment.   Advised patient to call back to discuss further if assistance still needed.  Left call back number.

## 2024-03-26 NOTE — Progress Notes (Unsigned)
 Cardiology Office Note   Date:  03/27/2024  ID:  Paul Choi, DOB [birth year], MRN [id number] PCP: Paul Ophelia JINNY DOUGLAS, MD   HeartCare Providers Cardiologist:  Ozell Fell, MD   History of Present Illness Paul Choi is a 78 y.o. male with a history of CAD s/p PCI RCA 2017, HFimpEF, PVCs, HLD, and hypertension who presents for follow-up for CAD.   Left heart cath in 2017 showed critical stenosis of the proximal RCA treated with coronary stenting, chronic occlusion of the mid circumflex with first OM branch collateralized from the LAD and right coronary artery, moderate stenosis of large ramus intermedius branch, diffuse nonobstructive stenosis of the LAD.  Echo in 2018 showed EF of 45 to 50%.  Echo in 2019 showed EF of 55 to 60%.   Patient has been followed by Dr. Fernande over the years for PVCs. Heart monitor in 2017 showed 6.3% PVC burden.   Echo 12/2022 showed LVEF 50-55%, no WMA, mild LVH, normal RVSF, mild MR, mild AI.    The patient was seen 11/18/23 reporting SOB exertion. Cardiac PET stress test was ordered. Cardiac PET stress was abnormal showing prior infarction and per-infarct ischemia, high risk study. Echo showed LVEF 55-60%, no WMA, G1DD, mild to mod MR, mild calcification of the aortic valve.  Patient was set up for a heart cath.  Cardiac cath 03/17/2024 showed patent RCA stent with minimal restenosis, known chronically occluded left circumflex with collaterals, mild to moderate diffuse three-vessel disease unchanged from before.  Medical management was continued  Today, the patient is overall doing well.  Cath site looks stable.  He reports no chest pain, but persistent and unchanged shortness of breath.  He will see PCP next week to request pulmonology consult.  Patient denies lower leg edema.  Studies Reviewed      LHC 02/2024    Prox LAD to Mid LAD lesion is 30% stenosed.   Mid LAD lesion is 30% stenosed.   Prox Cx lesion is 100% stenosed.   Prox RCA-2  lesion is 10% stenosed.   Prox RCA to Mid RCA lesion is 30% stenosed.   Dist RCA lesion is 30% stenosed.   Prox RCA-1 lesion is 30% stenosed.   Ost LAD lesion is 30% stenosed.   Ramus lesion is 60% stenosed.   Lat Ramus lesion is 60% stenosed.   1.  Patent RCA stent with minimal restenosis.  Known chronically occluded left circumflex with collaterals.  Mild to moderate diffuse three-vessel disease unchanged from before. 2.  Left ventricular angiography was not performed.  I could not cross the aortic valve due to innominate artery tortuosity.   Recommendations: Continue medical therapy for coronary artery disease.  No targets for revascularization.   Cardiac PET stress 12/2023    LV perfusion is abnormal. Defect 1: There is a medium defect with severe reduction in uptake present in the apical to basal anterolateral, inferolateral and lateral location(s) that is partially reversible. There is abnormal wall motion in the defect area. Consistent with infarction and peri-infarct ischemia.   Rest left ventricular function is abnormal. Rest global function is moderately reduced. Rest EF: 42%. Stress left ventricular function is abnormal. Stress global function is moderately reduced. Stress EF: 39%. End diastolic cavity size is normal.   Myocardial blood flow was computed to be 1.34ml/g/min at rest and 1.43ml/g/min at stress. Global myocardial blood flow reserve was 1.34 and was abnormal.   Coronary calcium  assessment not performed due to prior revascularization.  Findings are consistent with infarction with peri-infarct ischemia. The study is high risk.   Electronically signed by: Paul Hanson, MD.     Echo 12/2023 1. Left ventricular ejection fraction, by estimation, is 55 to 60%. The  left ventricle has normal function. The left ventricle has no regional  wall motion abnormalities. Left ventricular diastolic parameters are  consistent with Grade I diastolic  dysfunction (impaired  relaxation).   2. Right ventricular systolic function is normal. The right ventricular  size is normal. Tricuspid regurgitation signal is inadequate for assessing  PA pressure.   3. The mitral valve is normal in structure. Mild to moderate mitral valve  regurgitation. No evidence of mitral stenosis.   4. The aortic valve is bicuspid. There is mild calcification of the  aortic valve. Aortic valve regurgitation is mild. Aortic valve  sclerosis/calcification is present, without any evidence of aortic  stenosis.   5. The inferior vena cava is normal in size with greater than 50%  respiratory variability, suggesting right atrial pressure of 3 mmHg.    Echo 12/2022  1. Left ventricular ejection fraction, by estimation, is 50 to 55%. Left  ventricular ejection fraction by 2D MOD biplane is 51.5 %. The left  ventricle has low normal function. The left ventricle has no regional wall  motion abnormalities. There is mild  left ventricular hypertrophy of the basal-septal segment. Left ventricular  diastolic parameters were normal.   2. Right ventricular systolic function is normal. The right ventricular  size is normal.   3. The mitral valve is normal in structure. Mild mitral valve  regurgitation.   4. The aortic valve is calcified. Aortic valve regurgitation is mild.  Aortic valve sclerosis/calcification is present, without any evidence of  aortic stenosis.   5. Aortic dilatation noted. There is mild dilatation of the aortic root  and of the ascending aorta, measuring 41 mm.   6. The inferior vena cava is normal in size with greater than 50%  respiratory variability, suggesting right atrial pressure of 3 mmHg.    Echocardiogram 12/27/16 EF 50, normal wall motion, grade 1 diastolic dysfunction, functionally bicuspid aortic valve (mean 6, peak 12), dilated aortic root (40 mm), dilated ascending aorta (41 mm), mild MR, mild BAE   Carotid US  4/18 Bilateral <50   Cardiac Catheterization  05/04/16 LAD proximal 30, mid 30 RI 60 LCx proximal 100; OM1, OM2 and lateral marginal 2 all filled by collaterals from first septal perforator RCA proximal 95, mid 40 PCI: 3.5 x 16 mm Promus Premier DES to the proximal RCA    Nuclear stress test 04/11/16 Lateral and inferolateral defect predominantly fixed with small area of peri-infarct ischemia, EF 42, moderate to high risk scan   Coronary CTA 02/29/16 IMPRESSION: 1. Anatomically tricuspid, but functionally bicuspid aortic valve with co-joined left and right aortic leaflet and minimally restricted leaflet opening with no evidence of stenosis. 2. Mildly dilated aortic root with maximum diameter 40 mm. 3. 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 An annual follow up with CTA or MRA is recommended. No prior study is available for comparison. 4. Coronary calcium  score of 750. This was 44 percentile for age and sex matched control. 5. Diffuse mild to moderate CAD, with a severe mixed plaque in the proximal RCA associated with 70-99% stenosis. A stress test is Recommended.   Echocardiogram 02/10/16 EF 55-60, grade 1 diastolic dysfunction, trivial AI, AV gradient mean 6/peak 12, mildly dilated ascending aorta (41 mm), mild  dilated aortic root (40 mm), normal RVSF   Holter 6/17 PVC burden 6.3        Physical Exam VS:  BP 130/70 (BP Location: Left Arm, Patient Position: Sitting, Cuff Size: Normal)   Pulse 76   Ht 6' (1.829 m)   Wt 180 lb 9.6 oz (81.9 kg)   SpO2 99%   BMI 24.49 kg/m        Wt Readings from Last 3 Encounters:  03/27/24 180 lb 9.6 oz (81.9 kg)  03/17/24 179 lb 0.2 oz (81.2 kg)  03/13/24 179 lb (81.2 kg)    GEN: Well nourished, well developed in no acute distress NECK: No JVD; No carotid bruits CARDIAC: RRR, no murmurs, rubs, gallops RESPIRATORY: minimal wheezing  ABDOMEN: Soft, non-tender, non-distended EXTREMITIES:  No edema; No deformity   ASSESSMENT AND PLAN  DOE Abnormal  stress test CAD s/p RCA stent Recent cardiac cath showed patent RCA stent with minimal restenosis with chronically occluded left circumflex with collaterals, mild to moderate diffuse three-vessel disease unchanged from before.  Medical therapy was recommended as there were no targets for revascularization.  Cath site has healed well.  Patient reports persistent shortness of breath.  He denies any chest pain. He is euvolemic on exam. Patient plans on seeing primary care next week to request pulmonology referral.  Post cath labs showed serum creatinine stable of 1.4 and hemoglobin 11.9.  Continue aspirin , statin, sublingual nitro.  If patient is still short of breath at follow-up can consider right heart cath.  HTN Blood pressure is well-controlled today.  Continue amlodipine  10 mg daily and losartan  100 mg daily.  CKD stage 3 Post cath SCr 1.4.  HLD LDL controlled on last check.  Continue Lipitor 80 mg daily.  PVCs Patient denies palpitations.  Left heart cath with stable CAD, so I will order a 2-week heart monitor to evaluate for PVC burden.       Dispo: Follow-up in 4 months  Signed, Clete Kuch VEAR Fishman, PA-C

## 2024-03-27 ENCOUNTER — Encounter: Payer: Self-pay | Admitting: Medical

## 2024-03-27 ENCOUNTER — Ambulatory Visit: Attending: Medical

## 2024-03-27 ENCOUNTER — Ambulatory Visit: Attending: Medical | Admitting: Medical

## 2024-03-27 VITALS — BP 130/70 | HR 76 | Ht 72.0 in | Wt 180.6 lb

## 2024-03-27 DIAGNOSIS — E782 Mixed hyperlipidemia: Secondary | ICD-10-CM | POA: Insufficient documentation

## 2024-03-27 DIAGNOSIS — N1832 Chronic kidney disease, stage 3b: Secondary | ICD-10-CM | POA: Diagnosis present

## 2024-03-27 DIAGNOSIS — I10 Essential (primary) hypertension: Secondary | ICD-10-CM | POA: Insufficient documentation

## 2024-03-27 DIAGNOSIS — I493 Ventricular premature depolarization: Secondary | ICD-10-CM

## 2024-03-27 DIAGNOSIS — I25110 Atherosclerotic heart disease of native coronary artery with unstable angina pectoris: Secondary | ICD-10-CM | POA: Diagnosis present

## 2024-03-27 DIAGNOSIS — R0609 Other forms of dyspnea: Secondary | ICD-10-CM | POA: Diagnosis present

## 2024-03-27 NOTE — Patient Instructions (Addendum)
 Medication Instructions:  Your physician recommends that you continue on your current medications as directed. Please refer to the Current Medication list given to you today.    *If you need a refill on your cardiac medications before your next appointment, please call your pharmacy*  Lab Work: No labs ordered today    Testing/Procedures: Your physician has recommended that you wear a 14 day Zio monitor.   This monitor is a medical device that records the heart's electrical activity. Doctors most often use these monitors to diagnose arrhythmias. Arrhythmias are problems with the speed or rhythm of the heartbeat. The monitor is a small device applied to your chest. You can wear one while you do your normal daily activities. While wearing this monitor if you have any symptoms to push the button and record what you felt. Once you have worn this monitor for the period of time provider prescribed (Usually 14 days), you will return the monitor device in the postage paid box. Once it is returned they will download the data collected and provide us  with a report which the provider will then review and we will call you with those results. Important tips:  Avoid showering during the first 24 hours of wearing the monitor. Avoid excessive sweating to help maximize wear time. Do not submerge the device, no hot tubs, and no swimming pools. Keep any lotions or oils away from the patch. After 24 hours you may shower with the patch on. Take brief showers with your back facing the shower head.  Do not remove patch once it has been placed because that will interrupt data and decrease adhesive wear time. Push the button when you have any symptoms and write down what you were feeling. Once you have completed wearing your monitor, remove and place into box which has postage paid and place in your outgoing mailbox.  If for some reason you have misplaced your box then call our office and we can provide another box  and/or mail it off for you.     Follow-Up: At [facility], you and your health needs are our priority.  As part of our continuing mission to provide you with exceptional heart care, our providers are all part of one team.  This team includes your primary Cardiologist (physician) and Advanced Practice Providers or APPs (Physician Assistants and Nurse Practitioners) who all work together to provide you with the care you need, when you need it.  Your next appointment:   4 month(s)  Provider:   You may see Ozell Fell, MD or one of the following Advanced Practice Providers on your designated Care Team:   Cadence Fairgrove, PA-C

## 2024-05-12 DIAGNOSIS — I493 Ventricular premature depolarization: Secondary | ICD-10-CM | POA: Diagnosis not present

## 2024-05-13 ENCOUNTER — Ambulatory Visit: Payer: Self-pay | Admitting: Medical

## 2024-05-13 DIAGNOSIS — I493 Ventricular premature depolarization: Secondary | ICD-10-CM

## 2024-05-31 NOTE — Progress Notes (Signed)
 Electrophysiology Office Note:   Date:  06/01/2024  ID:  Paul Choi, DOB 19-Feb-1946, MRN [id number]  Primary Cardiologist: Ozell Fell, MD Electrophysiologist: Fonda Kitty, MD      History of Present Illness:   Paul Choi is a 78 y.o. male with h/o CAD s/p PCI RCA 2017, HFimpEF, PVCs, HLD, and hypertension who is being seen today for evaluation of PVCs.  Discussed the use of AI scribe software for clinical note transcription with the patient, who gave verbal consent to proceed.  History of Present Illness He has a history of premature ventricular contractions (PVCs) with a PVC burden ranging from 6% to 9% over the years. He has not been on strong medications or undergone procedures for PVCs.  He experiences shortness of breath with activity. Previously, he attended the [facility] three times a week, engaging in recumbent biking and weightlifting. He feels short of breath with high bike resistance and during household chores like washing floors. He describes this as feeling 'a little tired' rather than having chest pain.  A CT scan during a stress test showed moderate emphysema. He has a history of long-term smoking but has quit. He has not consulted a pulmonologist. No significant cough or phlegm production, except for allergy-related symptoms when exposed to plants.  He mentions occasional ankle swelling, which he attributes to his medications. He has not experienced progressive swelling or required emergency care for this issue.   Review of systems complete and found to be negative unless listed in HPI.   EP Information / Studies Reviewed:    EKG is ordered today. Personal review as below.  EKG Interpretation Date/Time:  Monday June 01 2024 13:32:17 EDT Ventricular Rate:  68 PR Interval:  218 QRS Duration:  90 QT Interval:  384 QTC Calculation: 408 R Axis:   6  Text Interpretation: Sinus rhythm with sinus arrhythmia with 1st degree A-V block with occasional  Premature ventricular complexes Possible Anterior infarct (cited on or before 11-Mar-2024) When compared with ECG of 11-Mar-2024 10:57, No significant change was found Confirmed by Kitty Fonda [phone]) on 06/01/2024 1:39:07 PM   Echo 12/12/23:   1. Left ventricular ejection fraction, by estimation, is 55 to 60%. The  left ventricle has normal function. The left ventricle has no regional  wall motion abnormalities. Left ventricular diastolic parameters are  consistent with Grade I diastolic  dysfunction (impaired relaxation).   2. Right ventricular systolic function is normal. The right ventricular  size is normal. Tricuspid regurgitation signal is inadequate for assessing  PA pressure.   3. The mitral valve is normal in structure. Mild to moderate mitral valve  regurgitation. No evidence of mitral stenosis.   4. The aortic valve is bicuspid. There is mild calcification of the  aortic valve. Aortic valve regurgitation is mild. Aortic valve  sclerosis/calcification is present, without any evidence of aortic  stenosis.   5. The inferior vena cava is normal in size with greater than 50%  respiratory variability, suggesting right atrial pressure of 3 mmHg.   LHC 03/17/24:    Prox LAD to Mid LAD lesion is 30% stenosed.   Mid LAD lesion is 30% stenosed.   Prox Cx lesion is 100% stenosed.   Prox RCA-2 lesion is 10% stenosed.   Prox RCA to Mid RCA lesion is 30% stenosed.   Dist RCA lesion is 30% stenosed.   Prox RCA-1 lesion is 30% stenosed.   Ost LAD lesion is 30% stenosed.   Ramus lesion is 60% stenosed.  Lat Ramus lesion is 60% stenosed.   1.  Patent RCA stent with minimal restenosis.  Known chronically occluded left circumflex with collaterals.  Mild to moderate diffuse three-vessel disease unchanged from before. 2.  Left ventricular angiography was not performed.  I could not cross the aortic valve due to innominate artery tortuosity.  Zio 03/2024:   Physical Exam:   VS:  BP 126/72  (BP Location: Left Arm, Patient Position: Sitting, Cuff Size: Normal)   Pulse 68   Ht 6' (1.829 m)   Wt 179 lb (81.2 kg)   SpO2 99%   BMI 24.28 kg/m    Wt Readings from Last 3 Encounters:  06/01/24 179 lb (81.2 kg)  03/27/24 180 lb 9.6 oz (81.9 kg)  03/17/24 179 lb 0.2 oz (81.2 kg)     GEN: Well nourished, well developed in no acute distress NECK: No JVD CARDIAC: Normal rate, irregular rhythm. RESPIRATORY:  Clear to auscultation without rales, wheezing or rhonchi  ABDOMEN: Soft, non-distended EXTREMITIES:  No edema; No deformity   ASSESSMENT AND PLAN:    #DOE: Patient noted to have emphysema and diffuse bronchial wall thickening on his prior CT scans. He reports a significant smoking history. 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 He is not on any management for COPD. Will refer to pulmonology. He may choose to seek referral from his PCP.   #PVCs: Burden 9%. Similar to prior, burden is not significantly worsening. Would not expect this to suddenly result in dyspnea. Additionally, EF is preserved so no PVC induced cardiomyopathy to account for his symptoms. I suspect his symptoms are more likely to be related to untreated lung disease. We will refer him to pulmonary as mentioned above and have him return in a few months to reassess.  Follow up with Dr. Kennyth in 3 months  Signed, Fonda Kennyth, MD

## 2024-06-01 ENCOUNTER — Ambulatory Visit: Attending: Cardiology | Admitting: Cardiology

## 2024-06-01 ENCOUNTER — Encounter: Payer: Self-pay | Admitting: Cardiology

## 2024-06-01 VITALS — BP 126/72 | HR 68 | Ht 72.0 in | Wt 179.0 lb

## 2024-06-01 DIAGNOSIS — R0609 Other forms of dyspnea: Secondary | ICD-10-CM | POA: Diagnosis present

## 2024-06-01 DIAGNOSIS — I493 Ventricular premature depolarization: Secondary | ICD-10-CM | POA: Diagnosis not present

## 2024-06-01 NOTE — Patient Instructions (Addendum)
 Medication Instructions:  Your physician recommends that you continue on your current medications as directed. Please refer to the Current Medication list given to you today.  *If you need a refill on your cardiac medications before your next appointment, please call your pharmacy*  Follow-Up: At [facility], you and your health needs are our priority.  As part of our continuing mission to provide you with exceptional heart care, our providers are all part of one team.  This team includes your primary Cardiologist (physician) and Advanced Practice Providers or APPs (Physician Assistants and Nurse Practitioners) who all work together to provide you with the care you need, when you need it.  Your next appointment:   4 months  Provider:   You will see one of the following Advanced Practice Providers on your designated Care Team:   Charlies Arthur, PA-C Michael Andy Tillery, PA-C Suzann Riddle, NP Daphne Barrack, NP Artist Pouch, PA-C    Please give us  a call if you would like a referral to a pulmonologist

## 2024-06-17 ENCOUNTER — Institutional Professional Consult (permissible substitution): Admitting: Cardiology

## 2024-07-09 ENCOUNTER — Encounter: Payer: Self-pay | Admitting: Dermatology

## 2024-07-09 ENCOUNTER — Ambulatory Visit: Payer: Medicare Other | Admitting: Dermatology

## 2024-07-09 DIAGNOSIS — L853 Xerosis cutis: Secondary | ICD-10-CM

## 2024-07-09 DIAGNOSIS — D1801 Hemangioma of skin and subcutaneous tissue: Secondary | ICD-10-CM

## 2024-07-09 DIAGNOSIS — Z1283 Encounter for screening for malignant neoplasm of skin: Secondary | ICD-10-CM | POA: Diagnosis not present

## 2024-07-09 DIAGNOSIS — L720 Epidermal cyst: Secondary | ICD-10-CM

## 2024-07-09 DIAGNOSIS — L578 Other skin changes due to chronic exposure to nonionizing radiation: Secondary | ICD-10-CM | POA: Diagnosis not present

## 2024-07-09 DIAGNOSIS — D239 Other benign neoplasm of skin, unspecified: Secondary | ICD-10-CM

## 2024-07-09 DIAGNOSIS — W908XXA Exposure to other nonionizing radiation, initial encounter: Secondary | ICD-10-CM

## 2024-07-09 DIAGNOSIS — L814 Other melanin hyperpigmentation: Secondary | ICD-10-CM

## 2024-07-09 DIAGNOSIS — D229 Melanocytic nevi, unspecified: Secondary | ICD-10-CM

## 2024-07-09 DIAGNOSIS — Z86018 Personal history of other benign neoplasm: Secondary | ICD-10-CM

## 2024-07-09 DIAGNOSIS — L821 Other seborrheic keratosis: Secondary | ICD-10-CM

## 2024-07-09 NOTE — Progress Notes (Signed)
 Follow-Up Visit   Subjective  Paul Choi is a 78 y.o. male who presents for the following: Skin Cancer Screening and Full Body Skin Exam. Hx of dysplastic nevi. Hx of AKs.   The patient presents for Total-Body Skin Exam (TBSE) for skin cancer screening and mole check. The patient has spots, moles and lesions to be evaluated, some may be new or changing and the patient may have concern these could be cancer.    The following portions of the chart were reviewed this encounter and updated as appropriate: medications, allergies, medical history  Review of Systems:  No other skin or systemic complaints except as noted in HPI or Assessment and Plan.  Objective  Well appearing patient in no apparent distress; mood and affect are within normal limits.  A full examination was performed including scalp, head, eyes, ears, nose, lips, neck, chest, axillae, abdomen, back, buttocks, bilateral upper extremities, bilateral lower extremities, hands, feet, fingers, toes, fingernails, and toenails. All findings within normal limits unless otherwise noted below.   Relevant physical exam findings are noted in the Assessment and Plan.  Left Ala Nasi skin colored papule with central ostia   Assessment & Plan   SKIN CANCER SCREENING PERFORMED TODAY.   HISTORY OF DYSPLASTIC NEVUS - L flank, R lower chest, R lower abdomen, low back midline, R dorsal foot, L lat inferior chest No evidence of recurrence today Recommend regular full body skin exams Recommend daily broad spectrum sunscreen SPF 30+ to sun-exposed areas, reapply every 2 hours as needed.  Call if any new or changing lesions are noted between office visits   ACTINIC DAMAGE - Chronic condition, secondary to cumulative UV/sun exposure - diffuse scaly erythematous macules with underlying dyspigmentation - Recommend daily broad spectrum sunscreen SPF 30+ to sun-exposed areas, reapply every 2 hours as needed.  - Staying in the shade or  wearing long sleeves, sun glasses (UVA+UVB protection) and wide brim hats (4-inch brim around the entire circumference of the hat) are also recommended for sun protection.  - Call for new or changing lesions.  LENTIGINES, SEBORRHEIC KERATOSES, HEMANGIOMAS - Benign normal skin lesions - Benign-appearing - Call for any changes  MELANOCYTIC NEVI - Tan-brown and/or pink-flesh-colored symmetric macules and papules - Benign appearing on exam today - Observation - Call clinic for new or changing moles - Recommend daily use of broad spectrum spf 30+ sunscreen to sun-exposed areas.   EPIDERMAL INCLUSION CYST Exam: Subcutaneous nodule at L upper back  Benign-appearing. Exam most consistent with an epidermal inclusion cyst. Discussed that a cyst is a benign growth that can grow over time and sometimes get irritated or inflamed. Recommend observation if it is not bothersome. Discussed option of surgical excision to remove it if it is growing, symptomatic, or other changes noted. Please call for new or changing lesions so they can be evaluated.     EPIDERMAL CYST Left Ala Nasi The patient will observe these symptoms, and report promptly any worsening or unexpected persistence. RTC if growing or bleeding  MULTIPLE BENIGN NEVI   LENTIGINES   ACTINIC ELASTOSIS   CHERRY ANGIOMA   SEBORRHEIC KERATOSES   EIC (EPIDERMAL INCLUSION CYST)   DERMATOFIBROMA   XEROSIS CUTIS   Return in about 1 year (around 07/09/2025) for TBSE, HxDN, HxAK.  I, Jill Parcell, CMA, am acting as scribe for Boneta Sharps, MD.   Documentation: I have reviewed the above documentation for accuracy and completeness, and I agree with the above.  Boneta Sharps, MD

## 2024-07-09 NOTE — Patient Instructions (Signed)

## 2024-07-26 NOTE — Progress Notes (Unsigned)
 Cardiology Office Note   Date:  07/27/2024  ID:  CESAR ROGERSON, DOB [birth year], MRN [id number] PCP: Fernande Ophelia JINNY DOUGLAS, MD  [facility] Providers Cardiologist:  Ozell Fell, MD Electrophysiologist:  Fonda Kitty, MD   History of Present Illness Paul Choi is a 78 y.o. male with a history of CAD s/p PCI RCA 2017, HFimpEF, PVCs, HLD, and hypertension who presents for follow-up for CAD.   Left heart cath in 2017 showed critical stenosis of the proximal RCA treated with coronary stenting, chronic occlusion of the mid circumflex with first OM branch collateralized from the LAD and right coronary artery, moderate stenosis of large ramus intermedius branch, diffuse nonobstructive stenosis of the LAD.  Echo in 2018 showed EF of 45 to 50%.  Echo in 2019 showed EF of 55 to 60%.   Patient has been followed by Dr. Fernande over the years for PVCs. Heart monitor in 2017 showed 6.3% PVC burden.    Echo 12/2022 showed LVEF 50-55%, no WMA, mild LVH, normal RVSF, mild MR, mild AI.    The patient was seen 11/18/23 reporting SOB exertion. Cardiac PET stress test was ordered. Cardiac PET stress was abnormal showing prior infarction and per-infarct ischemia, high risk study. Echo showed LVEF 55-60%, no WMA, G1DD, mild to mod MR, mild calcification of the aortic valve.  Patient was set up for a heart cath.  Cardiac cath 03/17/2024 showed patent RCA stent with minimal restenosis, known chronically occluded left circumflex with collaterals, mild to moderate diffuse three-vessel disease unchanged from before.  Medical management was continued   The patient was seem 03/27/24 reporting persistent DOE. Heart monitor was ordered to monitor for PVCs. This showed 9% PVC burden and he was referred to EP. He saw EP 06/01/24 and it was felt DOE was lung related, and not to PVCs.  Today, the patient reports he saw the pulmonologist and was told he had emphysema. He was given a nebulizer. Overall breathing is much  better. No chest pain. He goes to the gym and on long walks. Diet needs to improve. No lightheadedness, dizziness, lower leg edema, heart racing, palpitations.   Studies Reviewed EKG Interpretation Date/Time:  Monday July 27 2024 09:14:51 EST Ventricular Rate:  82 PR Interval:  128 QRS Duration:  88 QT Interval:  376 QTC Calculation: 439 R Axis:   29  Text Interpretation: Sinus rhythm with marked sinus arrhythmia with occasional Premature ventricular complexes Cannot rule out Anterior infarct (cited on or before 11-Mar-2024) When compared with ECG of 01-Jun-2024 13:32, PR interval has decreased Confirmed by Franchester, Burdette Forehand ([id number]) on 07/27/2024 9:22:46 AM    LHC 02/2024    Prox LAD to Mid LAD lesion is 30% stenosed.   Mid LAD lesion is 30% stenosed.   Prox Cx lesion is 100% stenosed.   Prox RCA-2 lesion is 10% stenosed.   Prox RCA to Mid RCA lesion is 30% stenosed.   Dist RCA lesion is 30% stenosed.   Prox RCA-1 lesion is 30% stenosed.   Ost LAD lesion is 30% stenosed.   Ramus lesion is 60% stenosed.   Lat Ramus lesion is 60% stenosed.   1.  Patent RCA stent with minimal restenosis.  Known chronically occluded left circumflex with collaterals.  Mild to moderate diffuse three-vessel disease unchanged from before. 2.  Left ventricular angiography was not performed.  I could not cross the aortic valve due to innominate artery tortuosity.   Recommendations: Continue medical therapy for coronary artery disease.  No targets for revascularization.   Cardiac PET stress 12/2023    LV perfusion is abnormal. Defect 1: There is a medium defect with severe reduction in uptake present in the apical to basal anterolateral, inferolateral and lateral location(s) that is partially reversible. There is abnormal wall motion in the defect area. Consistent with infarction and peri-infarct ischemia.   Rest left ventricular function is abnormal. Rest global function is moderately reduced. Rest EF: 42%.  Stress left ventricular function is abnormal. Stress global function is moderately reduced. Stress EF: 39%. End diastolic cavity size is normal.   Myocardial blood flow was computed to be 1.64ml/g/min at rest and 1.15ml/g/min at stress. Global myocardial blood flow reserve was 1.34 and was abnormal.   Coronary calcium  assessment not performed due to prior revascularization.   Findings are consistent with infarction with peri-infarct ischemia. The study is high risk.   Electronically signed by: Lonni Hanson, MD.     Echo 12/2023 1. Left ventricular ejection fraction, by estimation, is 55 to 60%. The  left ventricle has normal function. The left ventricle has no regional  wall motion abnormalities. Left ventricular diastolic parameters are  consistent with Grade I diastolic  dysfunction (impaired relaxation).   2. Right ventricular systolic function is normal. The right ventricular  size is normal. Tricuspid regurgitation signal is inadequate for assessing  PA pressure.   3. The mitral valve is normal in structure. Mild to moderate mitral valve  regurgitation. No evidence of mitral stenosis.   4. The aortic valve is bicuspid. There is mild calcification of the  aortic valve. Aortic valve regurgitation is mild. Aortic valve  sclerosis/calcification is present, without any evidence of aortic  stenosis.   5. The inferior vena cava is normal in size with greater than 50%  respiratory variability, suggesting right atrial pressure of 3 mmHg.    Echo 12/2022  1. Left ventricular ejection fraction, by estimation, is 50 to 55%. Left  ventricular ejection fraction by 2D MOD biplane is 51.5 %. The left  ventricle has low normal function. The left ventricle has no regional wall  motion abnormalities. There is mild  left ventricular hypertrophy of the basal-septal segment. Left ventricular  diastolic parameters were normal.   2. Right ventricular systolic function is normal. The right ventricular   size is normal.   3. The mitral valve is normal in structure. Mild mitral valve  regurgitation.   4. The aortic valve is calcified. Aortic valve regurgitation is mild.  Aortic valve sclerosis/calcification is present, without any evidence of  aortic stenosis.   5. Aortic dilatation noted. There is mild dilatation of the aortic root  and of the ascending aorta, measuring 41 mm.   6. The inferior vena cava is normal in size with greater than 50%  respiratory variability, suggesting right atrial pressure of 3 mmHg.    Echocardiogram 12/27/16 EF 50, normal wall motion, grade 1 diastolic dysfunction, functionally bicuspid aortic valve (mean 6, peak 12), dilated aortic root (40 mm), dilated ascending aorta (41 mm), mild MR, mild BAE   Carotid US  4/18 Bilateral <50   Cardiac Catheterization 05/04/16 LAD proximal 30, mid 30 RI 60 LCx proximal 100; OM1, OM2 and lateral marginal 2 all filled by collaterals from first septal perforator RCA proximal 95, mid 40 PCI: 3.5 x 16 mm Promus Premier DES to the proximal RCA    Nuclear stress test 04/11/16 Lateral and inferolateral defect predominantly fixed with small area of peri-infarct ischemia, EF 42, moderate to high risk  scan   Coronary CTA 02/29/16 IMPRESSION: 1. Anatomically tricuspid, but functionally bicuspid aortic valve with co-joined left and right aortic leaflet and minimally restricted leaflet opening with no evidence of stenosis. 2. Mildly dilated aortic root with maximum diameter 40 mm. 3. 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 An annual follow up with CTA or MRA is recommended. No prior study is available for comparison. 4. Coronary calcium  score of 750. This was 26 percentile for age and sex matched control. 5. Diffuse mild to moderate CAD, with a severe mixed plaque in the proximal RCA associated with 70-99% stenosis. A stress test is Recommended.   Echocardiogram 02/10/16 EF 55-60, grade 1  diastolic dysfunction, trivial AI, AV gradient mean 6/peak 12, mildly dilated ascending aorta (41 mm), mild dilated aortic root (40 mm), normal RVSF   Holter 6/17 PVC burden 6.3    Physical Exam VS:  BP (!) 142/78 (BP Location: Left Arm, Patient Position: Sitting, Cuff Size: Normal)   Pulse 82   Ht 6' (1.829 m)   Wt 185 lb 3.2 oz (84 kg)   SpO2 95%   BMI 25.12 kg/m        Wt Readings from Last 3 Encounters:  07/27/24 185 lb 3.2 oz (84 kg)  06/01/24 179 lb (81.2 kg)  03/27/24 180 lb 9.6 oz (81.9 kg)    GEN: Well nourished, well developed in no acute distress NECK: No JVD; No carotid bruits CARDIAC: RRR, no murmurs, rubs, gallops RESPIRATORY:  Clear to auscultation without rales, wheezing or rhonchi  ABDOMEN: Soft, non-tender, non-distended EXTREMITIES:  No edema; No deformity   ASSESSMENT AND PLAN  CAD s/p PCI/DES RCA Cardiac cath showed patent RCA stent with minimal restenosis with chronically occluded left circumflex with collaterals, mild to mod 3V disease, unchanged from before. Medical therapy was recommended. The patient has chronic DOE from emphysema, and has been given a nebulizer with improvement. No chest pain reported. Continue AA 81mg  daily, Lipitor 80mg  daily, and SL NTG.   PVCs Prior heart monitor showed PVC burden 9%. He denies any symptoms and EF is normal. He is following with EP. Can consider low dose BB.   HTN Blood pressure is well controlled. Continue Amlodipine  10mg  daily and losartan  100mg  daily.   CKD stage 3 Scr around 1.4, GFR 51.   HLD LDL 59. Continue Lipitor 80mg  daily.        Dispo: Follow-up in 6 months  Signed, Paul Samad VEAR Fishman, PA-C

## 2024-07-27 ENCOUNTER — Ambulatory Visit: Attending: Medical | Admitting: Medical

## 2024-07-27 ENCOUNTER — Encounter: Payer: Self-pay | Admitting: Medical

## 2024-07-27 VITALS — BP 142/78 | HR 82 | Ht 72.0 in | Wt 185.2 lb

## 2024-07-27 DIAGNOSIS — I10 Essential (primary) hypertension: Secondary | ICD-10-CM

## 2024-07-27 DIAGNOSIS — I2510 Atherosclerotic heart disease of native coronary artery without angina pectoris: Secondary | ICD-10-CM | POA: Diagnosis not present

## 2024-07-27 DIAGNOSIS — I493 Ventricular premature depolarization: Secondary | ICD-10-CM | POA: Diagnosis not present

## 2024-07-27 DIAGNOSIS — E782 Mixed hyperlipidemia: Secondary | ICD-10-CM

## 2024-07-27 DIAGNOSIS — N1832 Chronic kidney disease, stage 3b: Secondary | ICD-10-CM | POA: Diagnosis not present

## 2024-07-27 NOTE — Patient Instructions (Signed)
 Medication Instructions:  Your physician recommends that you continue on your current medications as directed. Please refer to the Current Medication list given to you today.    *If you need a refill on your cardiac medications before your next appointment, please call your pharmacy*  Lab Work: No labs ordered today    Testing/Procedures: No test ordered today   Follow-Up: At [facility], you and your health needs are our priority.  As part of our continuing mission to provide you with exceptional heart care, our providers are all part of one team.  This team includes your primary Cardiologist (physician) and Advanced Practice Providers or APPs (Physician Assistants and Nurse Practitioners) who all work together to provide you with the care you need, when you need it.  Your next appointment:   6 month(s)  Provider:   You may see Ozell Fell, MD or one of the following Advanced Practice Providers on your designated Care Team:   Cadence Padre Ranchitos, PA-C

## 2024-08-03 ENCOUNTER — Emergency Department: Admission: EM | Admit: 2024-08-03 | Discharge: 2024-08-03 | Disposition: A | Discharge status: Home / Self Care

## 2024-08-03 ENCOUNTER — Other Ambulatory Visit: Payer: Self-pay

## 2024-08-03 ENCOUNTER — Emergency Department

## 2024-08-03 DIAGNOSIS — S60412A Abrasion of right middle finger, initial encounter: Secondary | ICD-10-CM | POA: Diagnosis not present

## 2024-08-03 DIAGNOSIS — S0083XA Contusion of other part of head, initial encounter: Secondary | ICD-10-CM

## 2024-08-03 DIAGNOSIS — I129 Hypertensive chronic kidney disease with stage 1 through stage 4 chronic kidney disease, or unspecified chronic kidney disease: Secondary | ICD-10-CM | POA: Diagnosis not present

## 2024-08-03 DIAGNOSIS — E1122 Type 2 diabetes mellitus with diabetic chronic kidney disease: Secondary | ICD-10-CM | POA: Diagnosis not present

## 2024-08-03 DIAGNOSIS — S60414A Abrasion of right ring finger, initial encounter: Secondary | ICD-10-CM | POA: Diagnosis not present

## 2024-08-03 DIAGNOSIS — I2510 Atherosclerotic heart disease of native coronary artery without angina pectoris: Secondary | ICD-10-CM | POA: Diagnosis not present

## 2024-08-03 DIAGNOSIS — S0993XA Unspecified injury of face, initial encounter: Secondary | ICD-10-CM | POA: Diagnosis present

## 2024-08-03 DIAGNOSIS — W19XXXA Unspecified fall, initial encounter: Secondary | ICD-10-CM

## 2024-08-03 DIAGNOSIS — S64496A Injury of digital nerve of right little finger, initial encounter: Secondary | ICD-10-CM | POA: Diagnosis not present

## 2024-08-03 DIAGNOSIS — Y92014 Private driveway to single-family (private) house as the place of occurrence of the external cause: Secondary | ICD-10-CM | POA: Diagnosis not present

## 2024-08-03 DIAGNOSIS — N189 Chronic kidney disease, unspecified: Secondary | ICD-10-CM | POA: Diagnosis not present

## 2024-08-03 DIAGNOSIS — W1839XA Other fall on same level, initial encounter: Secondary | ICD-10-CM | POA: Diagnosis not present

## 2024-08-03 DIAGNOSIS — T148XXA Other injury of unspecified body region, initial encounter: Secondary | ICD-10-CM

## 2024-08-03 NOTE — ED Triage Notes (Signed)
 Pt reports he was walking to his mailbox and he fell face first onto the driveway, pt denies blood thinner use. Pt reports he has a headache and multiple abrasions to face and knees.

## 2024-08-03 NOTE — ED Provider Notes (Signed)
 [facility] Provider Note    Event Date/Time   First MD Initiated Contact with Patient 08/03/24 2033     (approximate)   History   Fall    HPI  Paul Choi is a 78 y.o. male    with a past medical history of coronary artery disease, PVC, hypertension, toe infection, diabetes type 2, who presents to the ED complaining of fall. According to the patient, this afternoon he was walking to his mailbox and fell on his face onto the driveway.  Patient is taking baby aspirin .  Patient was having headache, he took acetaminophen  with resolution of the headache.  Patient denies loss of consciousness, blurry vision, nauseous.  Patient is here with his son.    Patient Active Problem List   Diagnosis Date Noted   Dyspnea on exertion 03/11/2024   Abnormal stress test 03/11/2024   PVC's (premature ventricular contractions) 03/11/2024   History of colonic polyps 11/08/2016   Right groin pain 11/08/2016   Coronary artery disease involving native heart without angina pectoris 05/05/2016   Essential hypertension 05/05/2016   Hyperlipidemia LDL goal <70 05/05/2016   CKD (chronic kidney disease) stage 3, GFR 30-59 ml/min (HCC) 05/05/2016   Abnormal nuclear cardiac imaging test 05/04/2016      Physical Exam   Triage Vital Signs: ED Triage Vitals  Encounter Vitals Group     BP 08/03/24 1932 (!) 155/60     Girls Systolic BP Percentile --      Girls Diastolic BP Percentile --      Boys Systolic BP Percentile --      Boys Diastolic BP Percentile --      Pulse Rate 08/03/24 1932 88     Resp 08/03/24 1932 17     Temp 08/03/24 1932 97.9 F (36.6 C)     Temp src --      SpO2 08/03/24 1932 100 %     Weight 08/03/24 1932 180 lb (81.6 kg)     Height 08/03/24 1932 6' (1.829 m)     Head Circumference --      Peak Flow --      Pain Score 08/03/24 1935 1     Pain Loc --      Pain Education --      Exclude from Growth Chart --     Most recent vital signs: Vitals:    08/03/24 1932  BP: (!) 155/60  Pulse: 88  Resp: 17  Temp: 97.9 F (36.6 C)  SpO2: 100%     Physical Exam Vitals and nursing note reviewed.  During triage patient has systolic hypertension.  General:          Awake, no distress.  Oriented x 4. Face: Presence of a dressing of the skin in the glabellar area, nasal bridge.  No depressions.  No tenderness to palpation.  No active bleeding. Nose: No active bleeding. CV:                  Good peripheral perfusion. Regular rate and rhythm.  Resp:               Normal effort. no tachypnea.Equal breath sounds bilaterally.  Abd:                 No distention.  Soft nontender Other:  Cervical spine: Skin is intact, no ecchymosis or hematomas.  No tenderness to palpation in the midline, spinal process or paraspinal muscles.  Full ROM. Right hand:  Presence of abrasion of the skin in the 3rd, 4th and 5th fingers.  No active bleeding.  Full ROM.  Sensation is intact.       Upper extremities: Full ROM, sensation is intact, pulses positive. Lower extremity: Full ROM.       ED Results / Procedures / Treatments   Labs (all labs ordered are listed, but only abnormal results are displayed) Labs Reviewed - No data to display   RADIOLOGY I independently reviewed and interpreted imaging and agree with radiologists findings.      PROCEDURES:  Critical Care performed:   Procedures   MEDICATIONS ORDERED IN ED: Medications - No data to display Clinical Course as of 08/03/24 2058  Mon Aug 03, 2024  2039 CT Head Wo Contrast  No acute intracranial abnormality related to the fall. 2. 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 3. Two soft tissue density lesions within the left parotid gland, incompletely evaluated and potentially clinically significant. Follow up as clinically indicated.   [AE]  2039 CT Cervical Spine Wo Contrast .  No acute cervical spine fracture or traumatic malalignment to account for neck pain following recent fall. 2. Mild multilevel degenerative changes of the cervical spine. 3. Multiple densities within the left parotid gland of uncertain significance. The need for further follow-up can be determined on a clinical basis.   [AE]    Clinical Course User Index [AE] Janit Kast, PA-C    IMPRESSION / MDM / ASSESSMENT AND PLAN / ED COURSE  I reviewed the triage vital signs and the nursing notes.  Differential diagnosis includes, but is not limited to, intracranial hemorrhage, cervical fracture, cervical subluxation, abrasion of the skin, facial trauma.  Patient's presentation is most consistent with acute complicated illness / injury requiring diagnostic workup.   Paul Choi is a 78 y.o., male who was brought today after falling on his face.  Patient denies loss of consciousness, blurry vision or nausea.  See HPI for further information.  On a physical exam, patient had systolic hypertension, oriented x 3.  Neck full ROM, no tenderness to palpation in the spinal muscles or paraspinal muscles.  Face presence of abrasion of the skin in the glabellar area, nasal bridge with no active bleeding.  No signs of fracture.  Right hand, abrasion of the skin in the fingers 2nd, 3rd and 4th fingers.  Full ROM.  Rest of the physical exam was normal. Patient's diagnosis is consistent with fall, facial contusion, abrasion. I independently reviewed and interpreted imaging and agree with radiologists findings.  I did not order any labs. I did review the patient's allergies and medications.The patient is in stable and satisfactory condition for discharge home  Patient will be discharged home without prescriptions. Patient is to follow up with PCP as needed or otherwise directed. Patient is given ED precautions to return to the ED for any worsening or new symptoms. Discussed plan of care with patient, answered  all of patient's questions, patient agreeable to plan of care. Patient verbalized understanding.  FINAL CLINICAL IMPRESSION(S) / ED DIAGNOSES   Final diagnoses:  Abrasion  Fall, initial encounter  Contusion of face, initial encounter     Rx / DC Orders   ED Discharge Orders     None        Note:  This document was prepared using Dragon voice recognition software and may include unintentional dictation errors.   Janit Kast,  PA-C 08/03/24 2058    Fernand Rossie HERO, MD 08/03/24 2300

## 2024-08-03 NOTE — Discharge Instructions (Addendum)
 You have been diagnosed with fall, abrasion of the skin, contusion of face.  Please drink plenty fluids.  You can take Tylenol  every 6 hours as needed for pain.  Please come back to ED or go to your PCP if you have new symptoms or symptoms worsen.  It was a pleasure to help you today.  Charnay Nazario PA-C

## 2024-08-26 ENCOUNTER — Other Ambulatory Visit: Payer: Self-pay | Admitting: Unknown Physician Specialty

## 2024-08-26 DIAGNOSIS — K118 Other diseases of salivary glands: Secondary | ICD-10-CM

## 2024-08-27 NOTE — Progress Notes (Signed)
 Paul Choi POUR, MD sent to Paul Choi RAMAN Approved for US  guided core biopsy of left parotid nodule.  No sedation.  HKM

## 2024-08-31 ENCOUNTER — Telehealth: Payer: Self-pay

## 2024-08-31 NOTE — Telephone Encounter (Signed)
 Patient for US  guided core LT Parotid Nodule Biopsy on Tues 09/01/24, I called and spoke with the patient on the phone and gave pre-procedure instructions. Pt was made aware to be here at 12:30p and check in at the [facility] registration desk. Pt stated understanding. Called 08/31/24

## 2024-09-01 ENCOUNTER — Other Ambulatory Visit: Payer: Self-pay | Admitting: Unknown Physician Specialty

## 2024-09-01 ENCOUNTER — Ambulatory Visit
Admission: RE | Admit: 2024-09-01 | Discharge: 2024-09-01 | Disposition: A | Source: Ambulatory Visit | Attending: Unknown Physician Specialty | Admitting: Unknown Physician Specialty

## 2024-09-01 DIAGNOSIS — D490 Neoplasm of unspecified behavior of digestive system: Secondary | ICD-10-CM | POA: Diagnosis not present

## 2024-09-01 DIAGNOSIS — K118 Other diseases of salivary glands: Secondary | ICD-10-CM | POA: Diagnosis present

## 2024-09-01 MED ORDER — LIDOCAINE HCL (PF) 1 % IJ SOLN
3.0000 mL | Freq: Once | INTRAMUSCULAR | Status: AC
  Administered 2024-09-01: 3 mL via INTRADERMAL

## 2024-09-01 NOTE — Procedures (Signed)
 Pre procedural Dx: Left parotid lesion  Post procedural Dx: Same  Technically successful US  guided FNA left parotid nodule   EBL: None.   Complications: None immediate.   KANDICE Banner, MD Pager #: [phone]

## 2024-09-02 LAB — CYTOLOGY - NON PAP

## 2024-10-06 ENCOUNTER — Ambulatory Visit: Admitting: Cardiology

## 2025-07-12 ENCOUNTER — Ambulatory Visit: Admitting: Dermatology
